# Patient Record
Sex: Female | Born: 1950 | Race: Black or African American | Hispanic: No | Marital: Married | Smoking: Former smoker
Health system: Southern US, Community
[De-identification: ages and names within clinical notes are randomized; demographics above are authoritative.]

## PROBLEM LIST (undated history)

## (undated) DIAGNOSIS — E079 Disorder of thyroid, unspecified: Secondary | ICD-10-CM

## (undated) DIAGNOSIS — M1990 Unspecified osteoarthritis, unspecified site: Secondary | ICD-10-CM

## (undated) DIAGNOSIS — J302 Other seasonal allergic rhinitis: Secondary | ICD-10-CM

## (undated) DIAGNOSIS — R011 Cardiac murmur, unspecified: Secondary | ICD-10-CM

## (undated) DIAGNOSIS — T7840XA Allergy, unspecified, initial encounter: Secondary | ICD-10-CM

## (undated) DIAGNOSIS — D649 Anemia, unspecified: Secondary | ICD-10-CM

## (undated) DIAGNOSIS — T4145XA Adverse effect of unspecified anesthetic, initial encounter: Secondary | ICD-10-CM

## (undated) DIAGNOSIS — K219 Gastro-esophageal reflux disease without esophagitis: Secondary | ICD-10-CM

## (undated) DIAGNOSIS — C801 Malignant (primary) neoplasm, unspecified: Secondary | ICD-10-CM

## (undated) DIAGNOSIS — K8020 Calculus of gallbladder without cholecystitis without obstruction: Secondary | ICD-10-CM

## (undated) DIAGNOSIS — J45909 Unspecified asthma, uncomplicated: Secondary | ICD-10-CM

## (undated) DIAGNOSIS — Z5189 Encounter for other specified aftercare: Secondary | ICD-10-CM

## (undated) DIAGNOSIS — T8859XA Other complications of anesthesia, initial encounter: Secondary | ICD-10-CM

## (undated) DIAGNOSIS — J189 Pneumonia, unspecified organism: Secondary | ICD-10-CM

## (undated) DIAGNOSIS — IMO0001 Reserved for inherently not codable concepts without codable children: Secondary | ICD-10-CM

## (undated) HISTORY — DX: Gastro-esophageal reflux disease without esophagitis: K21.9

## (undated) HISTORY — PX: BREAST SURGERY: SHX581

## (undated) HISTORY — PX: LUNG LOBECTOMY: SHX167

## (undated) HISTORY — PX: COLONOSCOPY: SHX174

## (undated) HISTORY — PX: POLYPECTOMY: SHX149

## (undated) HISTORY — DX: Cardiac murmur, unspecified: R01.1

## (undated) HISTORY — DX: Unspecified asthma, uncomplicated: J45.909

## (undated) HISTORY — DX: Anemia, unspecified: D64.9

## (undated) HISTORY — DX: Unspecified osteoarthritis, unspecified site: M19.90

## (undated) HISTORY — DX: Disorder of thyroid, unspecified: E07.9

## (undated) HISTORY — PX: UTERINE FIBROID SURGERY: SHX826

## (undated) HISTORY — DX: Allergy, unspecified, initial encounter: T78.40XA

## (undated) HISTORY — DX: Calculus of gallbladder without cholecystitis without obstruction: K80.20

## (undated) HISTORY — DX: Other seasonal allergic rhinitis: J30.2

## (undated) HISTORY — DX: Reserved for inherently not codable concepts without codable children: IMO0001

## (undated) HISTORY — DX: Encounter for other specified aftercare: Z51.89

---

## 1998-11-25 ENCOUNTER — Encounter: Payer: Self-pay | Admitting: Family Medicine

## 1998-11-25 ENCOUNTER — Ambulatory Visit (HOSPITAL_COMMUNITY): Admission: RE | Admit: 1998-11-25 | Discharge: 1998-11-25 | Payer: Self-pay | Admitting: Family Medicine

## 1998-12-16 ENCOUNTER — Ambulatory Visit (HOSPITAL_COMMUNITY): Admission: RE | Admit: 1998-12-16 | Discharge: 1998-12-16 | Payer: Self-pay | Admitting: Family Medicine

## 1998-12-16 ENCOUNTER — Encounter: Payer: Self-pay | Admitting: Family Medicine

## 1999-03-05 ENCOUNTER — Encounter: Payer: Self-pay | Admitting: Family Medicine

## 1999-03-05 ENCOUNTER — Ambulatory Visit (HOSPITAL_COMMUNITY): Admission: RE | Admit: 1999-03-05 | Discharge: 1999-03-05 | Payer: Self-pay | Admitting: Family Medicine

## 2000-01-28 ENCOUNTER — Ambulatory Visit (HOSPITAL_COMMUNITY): Admission: RE | Admit: 2000-01-28 | Discharge: 2000-01-28 | Payer: Self-pay | Admitting: Family Medicine

## 2000-01-28 ENCOUNTER — Encounter: Payer: Self-pay | Admitting: Family Medicine

## 2000-02-04 ENCOUNTER — Other Ambulatory Visit: Admission: RE | Admit: 2000-02-04 | Discharge: 2000-02-04 | Payer: Self-pay | Admitting: Family Medicine

## 2002-09-13 HISTORY — PX: ABDOMINAL HYSTERECTOMY: SHX81

## 2004-06-27 ENCOUNTER — Emergency Department (HOSPITAL_COMMUNITY): Admission: EM | Admit: 2004-06-27 | Discharge: 2004-06-27 | Payer: Self-pay | Admitting: Internal Medicine

## 2006-03-31 ENCOUNTER — Ambulatory Visit: Payer: Self-pay | Admitting: Obstetrics & Gynecology

## 2006-03-31 ENCOUNTER — Encounter: Payer: Self-pay | Admitting: Obstetrics & Gynecology

## 2006-04-13 ENCOUNTER — Ambulatory Visit (HOSPITAL_COMMUNITY): Admission: RE | Admit: 2006-04-13 | Discharge: 2006-04-13 | Payer: Self-pay | Admitting: Obstetrics & Gynecology

## 2006-05-02 ENCOUNTER — Ambulatory Visit: Payer: Self-pay | Admitting: Gynecology

## 2006-05-09 ENCOUNTER — Ambulatory Visit (HOSPITAL_COMMUNITY): Admission: RE | Admit: 2006-05-09 | Discharge: 2006-05-09 | Payer: Self-pay | Admitting: Obstetrics & Gynecology

## 2006-10-06 ENCOUNTER — Encounter: Admission: RE | Admit: 2006-10-06 | Discharge: 2006-10-06 | Payer: Self-pay | Admitting: Internal Medicine

## 2007-07-16 ENCOUNTER — Emergency Department (HOSPITAL_COMMUNITY): Admission: EM | Admit: 2007-07-16 | Discharge: 2007-07-16 | Payer: Self-pay | Admitting: Emergency Medicine

## 2008-10-21 ENCOUNTER — Emergency Department (HOSPITAL_COMMUNITY): Admission: EM | Admit: 2008-10-21 | Discharge: 2008-10-21 | Payer: Self-pay | Admitting: Emergency Medicine

## 2009-03-27 ENCOUNTER — Emergency Department (HOSPITAL_COMMUNITY): Admission: EM | Admit: 2009-03-27 | Discharge: 2009-03-27 | Payer: Self-pay | Admitting: Emergency Medicine

## 2010-12-21 LAB — BASIC METABOLIC PANEL
BUN: 14 mg/dL (ref 6–23)
CO2: 26 mEq/L (ref 19–32)
Calcium: 9.5 mg/dL (ref 8.4–10.5)
Chloride: 105 mEq/L (ref 96–112)
Creatinine, Ser: 0.78 mg/dL (ref 0.4–1.2)
GFR calc Af Amer: >60 mL/min (ref >60)
GFR calc non Af Amer: >60 mL/min (ref >60)
Glucose, Bld: 109 mg/dL — ABNORMAL HIGH (ref 70–99)
Potassium: 3.6 mEq/L (ref 3.5–5.1)
Sodium: 139 mEq/L (ref 135–145)

## 2010-12-21 LAB — DIFFERENTIAL
Basophils Absolute: 0 10*3/uL (ref 0.0–0.1)
Basophils Relative: 1 % (ref 0–1)
Eosinophils Absolute: 0 10*3/uL (ref 0.0–0.7)
Eosinophils Relative: 1 % (ref 0–5)
Lymphocytes Relative: 33 % (ref 12–46)
Lymphs Abs: 2.6 10*3/uL (ref 0.7–4.0)
Monocytes Absolute: 0.4 10*3/uL (ref 0.1–1.0)
Monocytes Relative: 5 % (ref 3–12)
Neutro Abs: 4.6 10*3/uL (ref 1.7–7.7)
Neutrophils Relative %: 60 % (ref 43–77)

## 2010-12-21 LAB — CBC
HCT: 41.7 % (ref 36.0–46.0)
Hemoglobin: 14 g/dL (ref 12.0–15.0)
MCHC: 33.5 g/dL (ref 30.0–36.0)
MCV: 89.9 fL (ref 78.0–100.0)
Platelets: 231 10*3/uL (ref 150–400)
RBC: 4.63 MIL/uL (ref 3.87–5.11)
RDW: 12.9 % (ref 11.5–15.5)
WBC: 7.7 10*3/uL (ref 4.0–10.5)

## 2010-12-21 LAB — HEMOCCULT GUIAC POC 1CARD (OFFICE): Fecal Occult Bld: NEGATIVE

## 2011-01-29 NOTE — Group Therapy Note (Signed)
Sarah Lee, Sarah Lee                 ACCOUNT NO.:  [id number]   MEDICAL RECORD NO.:  [id number]          PATIENT TYPE:  POB   LOCATION:  [facility]                   FACILITY:  [facility]   PHYSICIAN:  Elsie Lincoln, MD      DATE OF BIRTH:  1951-04-11   DATE OF SERVICE:  03/31/2006                                    CLINIC NOTE   This patient was seen at [facility].  The patient is a 60 year old female  para 2, status post hysterectomy in approximately 1996 or 1997.  The patient  presents for annual exam and also questionable left lower quadrant pain a  month ago.  She has extensive GYN history of endometriosis.  She had her  right ovary removed at age 18 for endometriosis and then a subsequent left  cystectomy for endometriosis and then a hysterectomy and left oophorectomy  in 1996 or 1997 by Dr. Myrlene Broker in [place].  She had her cervix left  behind due to heavy bleeding and required 4 units of blood transfusion.  She  said there was a lot of vascularity in the cervix and they were unable to  remove the cervix.  Since then she has been asymptomatic, however one month  ago she had some sharp pain in her left side as well as a bulge.  The pain  went directly from her front upper body to her back upper body.  She says  she has been able to do housework for longer than an hour without being  doubled over in pain.  I am still unclear whether this pain is in her pelvis  or if this is more musculoskeletal.  She is obese and does have some  arthritic pain in her knees.  She has been without insurance and unable to  get any of her health concerns addressed until recently.  The most of her  concerns today is that she wants a Pap smear, a mammogram and to see if this  bulge that she feels in her pelvis has something to do with her cervix.  She  is not experiencing any menopausal bleeding.  She is not sexually active  because her husband has erectile dysfunction secondary to bad diabetes.   PAST  MEDICAL HISTORY:  Swelling in one leg, dizzy spells and mild asthma.   PAST SURGICAL HISTORY:  All GYN surgery as above plus C-section x2, D&C x4  and questionable abnormal Pap smear that was resolved with D&C which does  not make sense to me.   ALLERGIES:  PENICILLIN AND QUESTIONABLY IBUPROFEN.   MEDICATIONS:  None.   FAMILY HISTORY:  Aunt has diabetes.  Grandfather had a heart attack.  Mother  has low blood pressure.  Aunt has breast cancer.  Aunt also has sarcoidosis.   REVIEW OF SYSTEMS:  Positive for bruising, swelling in her legs, muscle  aches, night sweats, fatigue, weight gain, dizzy spells, problems with  vision, nausea and hot flashes as well as pelvic pain as described above.   PHYSICAL EXAMINATION:  VITAL SIGNS:  Pulse 65, blood pressure 104/65, weight  236, height 5 feet  4 inches.  GENERAL:  Well-nourished, well-developed, no apparent distress.  THROAT:  Thyroid with no masses.  HEENT:  Normocephalic, atraumatic.  BREASTS:  No masses.  No lymphadenopathy.  No skin changes.  No thickening.  LUNGS:  Clear to auscultation bilaterally.  HEART:  Regular rate and rhythm.  ABDOMEN:  Soft, nontender, nondistended, obese.  No hernia.  No  lymphadenopathy.  GENITALIA:  Tanner 5.  Vagina slightly atrophic.  Urethra nontender.  Bladder well supported and nontender.  Cervix mobile.  Not really tender.  On bimanual there is no masses, however there is extreme pain on the left, I  believe the ischial spine.  This could be due to osteoarthritis.  I do not  believe there is any female organ that could be causing this.  This could be  due to some inflammatory process or arthritis as just described.  Her cervix  is very mobile and I do not believe this is causing her pain.  The right  adnexa is nontender with no masses.  EXTREMITIES:  On the lower extremities the left ankle is more swollen than  the right with evidence of venous stasis.   PLAN:  A 60 year old female:  1.  Pap  smear.  2.  Mammogram.  3.  Guaiac stool cards.  4.  Transvaginal ultrasound to look at pelvis.  5.  Plain film of pelvis to rule out osteoarthritis.  6.  Return to clinic to discuss pelvic pain with Dr. Mia Creek to see if he      can try colectomy and __________ laparoscopy if warranted.  I believe      her pain is more musculoskeletal in the left ischial spine and pelvis.      We will defer to Dr. Mart Piggs exam.           ______________________________  Elsie Lincoln, MD     KL/MEDQ  D:  03/31/2006  T:  03/31/2006  Job:  [id number]

## 2011-05-20 ENCOUNTER — Encounter: Payer: Self-pay | Admitting: Obstetrics and Gynecology

## 2011-05-20 ENCOUNTER — Ambulatory Visit (INDEPENDENT_AMBULATORY_CARE_PROVIDER_SITE_OTHER): Payer: Self-pay | Admitting: Obstetrics and Gynecology

## 2011-05-20 DIAGNOSIS — Z01419 Encounter for gynecological examination (general) (routine) without abnormal findings: Secondary | ICD-10-CM

## 2011-05-20 DIAGNOSIS — Z0142 Encounter for cervical smear to confirm findings of recent normal smear following initial abnormal smear: Secondary | ICD-10-CM

## 2011-05-20 DIAGNOSIS — K5900 Constipation, unspecified: Secondary | ICD-10-CM | POA: Insufficient documentation

## 2011-05-20 DIAGNOSIS — E669 Obesity, unspecified: Secondary | ICD-10-CM

## 2011-05-20 NOTE — Patient Instructions (Addendum)
It was great to see you today! You don't have anything that I am concerned about either on my exam of your cervix or your belly. I would recommend you work hard on loosing weight and exercising. I think that your belly pain is primarily from your constipation.  I want you to get some Colace (or it's generic form) and take 2 100mg  tablets every night for a week.  If that does not help regulate your bowels, go to 2 100mg  tablets two times per day.  If, after a week of this, you still are not having a soft bowel movement every day, I want you to start taking Miralax.  Start out with 1/2 capful today and go up by 1/2 capful every week until you are having one soft bowel movement every day. Come back to see [place] as needed.

## 2011-05-20 NOTE — Progress Notes (Signed)
Subjective: Has area on the left side of abdomon that is aching and throbbing intermittently.  Is located in the upper left abdomon just under the ribs.  No aggrevating/alleviating factors.  Ocurrs several times per month.  When she has this pain she often also has back pain.  Has had hysterectomy that spared cervix.  [facility]  Has had mammograms in the past but not in the past year and a half.  Objective:  Filed Vitals:   05/20/11 1436  BP: 121/78  Pulse: 71  Temp: 97.1 F (36.2 C)   Gen: NAD Abd: Obese, very mild tenderness in LUQ but no dominant masses or organomegaly.  No palpable stool in colon. Pelvic: Normal female external genitalia, normal vaginal mucosa.  No significant vaginal discharge.  No cervical motion tenderness.  Uterus is surgically absent.  No evidence of masses.

## 2012-02-14 LAB — HM PAP SMEAR

## 2012-04-25 ENCOUNTER — Other Ambulatory Visit: Payer: Self-pay | Admitting: Family Medicine

## 2012-04-25 DIAGNOSIS — K8020 Calculus of gallbladder without cholecystitis without obstruction: Secondary | ICD-10-CM

## 2012-04-25 DIAGNOSIS — R109 Unspecified abdominal pain: Secondary | ICD-10-CM

## 2012-05-01 ENCOUNTER — Ambulatory Visit
Admission: RE | Admit: 2012-05-01 | Discharge: 2012-05-01 | Disposition: A | Discharge status: Home / Self Care | Payer: BC Managed Care – PPO | Source: Ambulatory Visit | Attending: Family Medicine | Admitting: Family Medicine

## 2012-05-01 DIAGNOSIS — K8020 Calculus of gallbladder without cholecystitis without obstruction: Secondary | ICD-10-CM

## 2012-05-01 DIAGNOSIS — R109 Unspecified abdominal pain: Secondary | ICD-10-CM

## 2012-05-31 ENCOUNTER — Ambulatory Visit (AMBULATORY_SURGERY_CENTER): Payer: BC Managed Care – PPO | Admitting: *Deleted

## 2012-05-31 ENCOUNTER — Other Ambulatory Visit: Payer: Self-pay | Admitting: Family Medicine

## 2012-05-31 ENCOUNTER — Telehealth: Payer: Self-pay | Admitting: *Deleted

## 2012-05-31 ENCOUNTER — Encounter: Payer: Self-pay | Admitting: Gastroenterology

## 2012-05-31 VITALS — Ht 64.0 in | Wt 228.6 lb

## 2012-05-31 DIAGNOSIS — M79604 Pain in right leg: Secondary | ICD-10-CM

## 2012-05-31 DIAGNOSIS — Z1211 Encounter for screening for malignant neoplasm of colon: Secondary | ICD-10-CM

## 2012-05-31 MED ORDER — NA SULFATE-K SULFATE-MG SULF 17.5-3.13-1.6 GM/177ML PO SOLN: 1.0000 | Freq: Once | ORAL | Status: DC

## 2012-05-31 NOTE — Telephone Encounter (Signed)
I don't see this as a problem since the event took place 4 years ago

## 2012-05-31 NOTE — Telephone Encounter (Signed)
Dr. Arlyce Dice and Jonny Ruiz,  This pt had her PV today.  Her colonoscopy is scheduled for 06-12-12.  Just wanted to make sure of something.  Pt states she fell and hit her head in 2009.  She still has occasional ringing in her right ear, headaches at times, and occasional dizziness.  Her family MD is setting her up to see a neurologist for this. I just wanted to make sure you didn't want her to have her procedure after her neurology visit.   Thanks, [employer]

## 2012-05-31 NOTE — Telephone Encounter (Signed)
Noted  

## 2012-05-31 NOTE — Progress Notes (Signed)
Pt has no allergy to eggs or soy products  Pt states she fell and hit her head in 2009.  She still has occasional ringing in her right ear, headaches at times, and occasional dizziness.  Her family MD is setting her up to see a neurologist for this.  Note sent to Dr. Arlyce Dice and Azucena Kuba CRNA to make sure they do not want her to have procedure after her visit to the neurologist

## 2012-06-01 ENCOUNTER — Other Ambulatory Visit: Payer: BC Managed Care – PPO

## 2012-06-08 ENCOUNTER — Ambulatory Visit (INDEPENDENT_AMBULATORY_CARE_PROVIDER_SITE_OTHER): Payer: BC Managed Care – PPO | Admitting: General Surgery

## 2012-06-09 ENCOUNTER — Telehealth: Payer: Self-pay | Admitting: Gastroenterology

## 2012-06-09 ENCOUNTER — Encounter: Payer: BC Managed Care – PPO | Admitting: Gastroenterology

## 2012-06-09 NOTE — Telephone Encounter (Signed)
Called patient and advised her to come by [facility] 3rd floor to pick up sample of Suprep.  She agreed to do so today before 4:30 pm as it will be left at front desk by Ezra Sites, RN.

## 2012-06-12 ENCOUNTER — Encounter: Payer: Self-pay | Admitting: Gastroenterology

## 2012-06-12 ENCOUNTER — Ambulatory Visit (AMBULATORY_SURGERY_CENTER): Payer: BC Managed Care – PPO | Admitting: Gastroenterology

## 2012-06-12 VITALS — BP 130/66 | HR 65 | Temp 97.2°F | Resp 12 | Ht 64.5 in | Wt 229.0 lb

## 2012-06-12 DIAGNOSIS — K5730 Diverticulosis of large intestine without perforation or abscess without bleeding: Secondary | ICD-10-CM

## 2012-06-12 DIAGNOSIS — D126 Benign neoplasm of colon, unspecified: Secondary | ICD-10-CM

## 2012-06-12 DIAGNOSIS — Z800 Family history of malignant neoplasm of digestive organs: Secondary | ICD-10-CM

## 2012-06-12 DIAGNOSIS — Z1211 Encounter for screening for malignant neoplasm of colon: Secondary | ICD-10-CM

## 2012-06-12 MED ORDER — SODIUM CHLORIDE 0.9 % IV SOLN: 500.0000 mL | INTRAVENOUS | Status: DC

## 2012-06-12 NOTE — Patient Instructions (Addendum)
YOU HAD AN ENDOSCOPIC PROCEDURE TODAY AT THE  ENDOSCOPY CENTER: Refer to the procedure report that was given to you for any specific questions about what was found during the examination.  If the procedure report does not answer your questions, please call your gastroenterologist to clarify.  If you requested that your care partner not be given the details of your procedure findings, then the procedure report has been included in a sealed envelope for you to review at your convenience later.  YOU SHOULD EXPECT: Some feelings of bloating in the abdomen. Passage of more gas than usual.  Walking can help get rid of the air that was put into your GI tract during the procedure and reduce the bloating. If you had a lower endoscopy (such as a colonoscopy or flexible sigmoidoscopy) you may notice spotting of blood in your stool or on the toilet paper. If you underwent a bowel prep for your procedure, then you may not have a normal bowel movement for a few days.  DIET: Your first meal following the procedure should be a light meal and then it is ok to progress to your normal diet.  A half-sandwich or bowl of soup is an example of a good first meal.  Heavy or fried foods are harder to digest and may make you feel nauseous or bloated.  Likewise meals heavy in dairy and vegetables can cause extra gas to form and this can also increase the bloating.  Drink plenty of fluids but you should avoid alcoholic beverages for 24 hours.  ACTIVITY: Your care partner should take you home directly after the procedure.  You should plan to take it easy, moving slowly for the rest of the day.  You can resume normal activity the day after the procedure however you should NOT DRIVE or use heavy machinery for 24 hours (because of the sedation medicines used during the test).    SYMPTOMS TO REPORT IMMEDIATELY: A gastroenterologist can be reached at any hour.  During normal business hours, 8:30 AM to 5:00 PM Monday through Friday,  call [phone].  After hours and on weekends, please call the GI answering service at [phone] who will take a message and have the physician on call contact you.   Following lower endoscopy (colonoscopy or flexible sigmoidoscopy):  Excessive amounts of blood in the stool  Significant tenderness or worsening of abdominal pains  Swelling of the abdomen that is new, acute  Fever of 100F or higher  FOLLOW UP: If any biopsies were taken you will be contacted by phone or by letter within the next 1-3 weeks.  Call your gastroenterologist if you have not heard about the biopsies in 3 weeks.  Our staff will call the home number listed on your records the next business day following your procedure to check on you and address any questions or concerns that you may have at that time regarding the information given to you following your procedure. This is a courtesy call and so if there is no answer at the home number and we have not heard from you through the emergency physician on call, we will assume that you have returned to your regular daily activities without incident.  SIGNATURES/CONFIDENTIALITY: You and/or your care partner have signed paperwork which will be entered into your electronic medical record.  These signatures attest to the fact that that the information above on your After Visit Summary has been reviewed and is understood.  Full responsibility of the confidentiality of this   discharge information lies with you and/or your care-partner.   Thank-you for choosing us for your healthcare needs. 

## 2012-06-12 NOTE — Op Note (Signed)
[facility] [address] [place], [address]   COLONOSCOPY PROCEDURE REPORT  PATIENT: Sarah Lee, Sarah Lee  MR#: [id number] BIRTHDATE: 07-01-1951 , 61  yrs. old GENDER: Female ENDOSCOPIST: Louis Meckel, MD REFERRED XB:JYNWG Blount, M.D. PROCEDURE DATE:  06/12/2012 PROCEDURE:   Colonoscopy with cold biopsy polypectomy ASA CLASS:   Class II INDICATIONS:elevated risk screening, patient's immediate family history of colon cancer, and father with colon cancer in her early 61s. MEDICATIONS: MAC sedation, administered by CRNA and Propofol (Diprivan) 380 mg IV  DESCRIPTION OF PROCEDURE:   After the risks benefits and alternatives of the procedure were thoroughly explained, informed consent was obtained.  A digital rectal exam revealed no abnormalities of the rectum.   The LB CF-[id number]  endoscope was introduced through the anus and advanced to the cecum, which was identified by both the appendix and ileocecal valve. No adverse events experienced.   The quality of the prep was Suprep excellent The instrument was then slowly withdrawn as the colon was fully examined.      COLON FINDINGS: A sessile polyp measuring 2 mm in size was found at the cecum.  A polypectomy was performed with cold forceps.  The resection was complete and the polyp tissue was completely retrieved.   Mild diverticulosis was noted in the ascending colon. The colon mucosa was otherwise normal.  Retroflexed views revealed no abnormalities. The time to cecum=3 minutes 29 seconds. Withdrawal time=7 minutes 32 seconds.  The scope was withdrawn and the procedure completed. COMPLICATIONS: There were no complications.  ENDOSCOPIC IMPRESSION: 1.   Sessile polyp measuring 2 mm in size was found at the cecum; polypectomy was performed with cold forceps 2.   Mild diverticulosis was noted in the ascending colon 3.   The colon mucosa was otherwise normal  RECOMMENDATIONS: Given your significant  family history of colon cancer, you should have a repeat colonoscopy in 5 years   eSigned:  Louis Meckel, MD 06/12/2012 10:44 AM   cc:

## 2012-06-12 NOTE — Progress Notes (Signed)
Patient did not have preoperative order for IV antibiotic SSI prophylaxis. (G8918)   

## 2012-06-12 NOTE — Progress Notes (Signed)
Patient did not experience any of the following events: a burn prior to discharge; a fall within the facility; wrong site/side/patient/procedure/implant event; or a hospital transfer or hospital admission upon discharge from the facility. (G8907)  

## 2012-06-13 ENCOUNTER — Telehealth: Payer: Self-pay

## 2012-06-13 NOTE — Telephone Encounter (Signed)
Left message on answering machine. 

## 2012-06-14 ENCOUNTER — Encounter (INDEPENDENT_AMBULATORY_CARE_PROVIDER_SITE_OTHER): Payer: Self-pay | Admitting: General Surgery

## 2012-06-16 ENCOUNTER — Encounter: Payer: Self-pay | Admitting: Gastroenterology

## 2012-10-12 ENCOUNTER — Ambulatory Visit (INDEPENDENT_AMBULATORY_CARE_PROVIDER_SITE_OTHER): Payer: BC Managed Care – PPO | Admitting: Gastroenterology

## 2012-10-12 ENCOUNTER — Telehealth: Payer: Self-pay | Admitting: Gastroenterology

## 2012-10-12 ENCOUNTER — Encounter: Payer: Self-pay | Admitting: Gastroenterology

## 2012-10-12 VITALS — BP 132/64 | HR 82 | Ht 64.5 in | Wt 228.0 lb

## 2012-10-12 DIAGNOSIS — K649 Unspecified hemorrhoids: Secondary | ICD-10-CM

## 2012-10-12 DIAGNOSIS — K625 Hemorrhage of anus and rectum: Secondary | ICD-10-CM

## 2012-10-12 MED ORDER — HYDROCORTISONE ACETATE 25 MG RE SUPP: 25.0000 mg | Freq: Every evening | RECTAL | Status: DC | PRN

## 2012-10-12 NOTE — Patient Instructions (Addendum)
We have sent the following medications to your pharmacy for you to pick up at your convenience: Anusol suppositories; please take as directed.  Follow up as needed

## 2012-10-12 NOTE — Telephone Encounter (Signed)
Pt states she has hemorrhoids and they were bothering her on Monday and now they are shrinking. States that when she gets up to walk however there is bright red blood coming from her rectum. Pt is wearing a pad to catch the blood. Pt scheduled to see Doug Sou PA today at 3pm. Pt aware of appt date and time.

## 2012-10-12 NOTE — Progress Notes (Signed)
10/12/2012 Sarah Lee [id number] Jan 13, 1951   HISTORY OF PRESENT ILLNESS:  Patient is a pleasant 62 year old female who is a patient of Dr. Marzetta Board.  She presents to our office today with complaints of hemorrhoidal bleeding.  Had some irritation last week, but that resolved on its own.  Then Monday she began seeing some blood on the toilet paper and in her underwear after she is up and moving around for a while.  Did not use anything for them.  She has seen blood in the past.  She had a colonoscopy in 05/2012, which showed only mild diverticulosis and one small polyp that was actually benign mucosa on pathology report.  Denies any rectal or abdominal pain.  Has chronic constipation, but says that she has been controlling that fairly well with diet recently.  She says that she has used Miralax and other regimens in the past without much success.     Past Medical History  Diagnosis Date  . Anemia   . Blood transfusion   . Gallstones   . Constipation   . Allergy   . Asthma     no medications  . Cataract     slight  . GERD (gastroesophageal reflux disease)   . Heart murmur    Past Surgical History  Procedure Date  . Abdominal hysterectomy   . Uterine fibroid surgery   . Cesarean section     reports that she quit smoking about 24 years ago. She has never used smokeless tobacco. She reports that she does not drink alcohol or use illicit drugs. family history includes Cancer in her father and Stomach cancer in her cousin and maternal uncle.  There is no history of Colon cancer, and Esophageal cancer, and Rectal cancer, . Allergies  Allergen Reactions  . Iron Other (See Comments)    Extreme constipation  . Penicillins Rash  . Tetracyclines & Related Nausea And Vomiting and Anxiety      No outpatient encounter prescriptions on file as of 10/12/2012.     REVIEW OF SYSTEMS  : All other systems reviewed and negative except where noted in the History of Present Illness.   PHYSICAL  EXAM: BP 132/64  Pulse 82  Ht 5' 4.5" (1.638 m)  Wt 228 lb (103.42 kg)  BMI 38.53 kg/m2 General: Well developed black female in no acute distress Head: Normocephalic and atraumatic Eyes:  sclerae anicteric,conjunctive pink. Ears: Normal auditory acuity Lungs: Clear throughout to auscultation Heart: Regular rate and rhythm Abdomen: Soft, nontender, non-distended. No masses or hepatomegaly noted. Normal bowel sounds. Rectal: Enlarged hemorrhoid noted with some blood.  Small amount of pink blood on exam glove. Musculoskeletal: Symmetrical with no gross deformities  Skin: No lesions on visible extremities Extremities: No edema  Neurological: Alert oriented x 4, grossly nonfocal Psychological:  Alert and cooperative. Normal mood and affect  ASSESSMENT AND PLAN: -Hemorrhoid with rectal bleeding:  Will give anusol suppositories to use at bedtime for the next two weeks.  She will call if there is an ongoing issue. -Constipation:  Says that she has bene managing this fairly well with diet recently and would like to continue with that for now.

## 2012-10-22 NOTE — Progress Notes (Signed)
Reviewed and agree with management. Robert D. Kaplan, M.D., FACG  

## 2013-06-27 ENCOUNTER — Other Ambulatory Visit (HOSPITAL_COMMUNITY): Payer: Self-pay | Admitting: Internal Medicine

## 2013-06-27 ENCOUNTER — Ambulatory Visit (HOSPITAL_COMMUNITY)
Admission: RE | Admit: 2013-06-27 | Discharge: 2013-06-27 | Disposition: A | Discharge status: Home / Self Care | Payer: BC Managed Care – PPO | Source: Ambulatory Visit | Attending: Internal Medicine | Admitting: Internal Medicine

## 2013-06-27 DIAGNOSIS — R0602 Shortness of breath: Secondary | ICD-10-CM

## 2013-07-18 ENCOUNTER — Ambulatory Visit: Payer: BC Managed Care – PPO | Admitting: Neurology

## 2013-08-15 ENCOUNTER — Ambulatory Visit (INDEPENDENT_AMBULATORY_CARE_PROVIDER_SITE_OTHER): Payer: BC Managed Care – PPO | Admitting: Neurology

## 2013-08-15 ENCOUNTER — Encounter: Payer: Self-pay | Admitting: Neurology

## 2013-08-15 VITALS — BP 100/68 | HR 78 | Temp 98.1°F | Ht 64.0 in | Wt 226.0 lb

## 2013-08-15 DIAGNOSIS — H9390 Unspecified disorder of ear, unspecified ear: Secondary | ICD-10-CM

## 2013-08-15 DIAGNOSIS — H8193 Unspecified disorder of vestibular function, bilateral: Secondary | ICD-10-CM

## 2013-08-15 DIAGNOSIS — R42 Dizziness and giddiness: Secondary | ICD-10-CM

## 2013-08-15 NOTE — Progress Notes (Addendum)
NEUROLOGY CONSULTATION NOTE  Sarah Lee MRN: [id number] DOB: [birth year]  Referring provider: Dr. Bruna Potter Primary care provider: Dr. Bruna Potter  Reason for consult:  Dizziness  HISTORY OF PRESENT ILLNESS: Sarah Lee is a 62 year old right-handed woman with history of hypercholesterolemia who presents for dizziness.  Records and images were personally reviewed where available.    In 2010, she slipped on the ice, falling backwards and hitting her head.  She didn't lose consciousness.  She said that there was clear fluid from her ears.  She developed a headache and did go to urgent care the next day and was diagnosed with posttraumatic headache.  She did not have any workup such as a head scan.  Since that time, she has suffered intermittent headaches.  Over the past 3 months, she has had progressive dizzy spells.  She notes sudden onset of sensation of movement in her head.  It is not spinning but more of a wave-like movement.  There is no sense of feeling like she is going to pass out.  It is not associated with dysarthria, dysphagia, headache, nausea, photophobia or phonophobia.  There is no associated numbness or weakness of the extremities.  It usually lasts 30 to 60 seconds.  It can occur spontaneously while walking, or when she is focusing her eyes and moving her head, such as looking around and reading labels at the store.  It does not typically occur with getting up or laying down.  She does have history of tinnitus since the accident, but no hearing loss.  She does note a clogging sensation in her left ear, sometimes with a pain in her outer ear canal (like a pimple).  PAST MEDICAL HISTORY: Past Medical History  Diagnosis Date  . Anemia   . Blood transfusion   . Gallstones   . Constipation   . Allergy   . Asthma     no medications  . Cataract     slight  . GERD (gastroesophageal reflux disease)   . Heart murmur     PAST SURGICAL HISTORY: Past Surgical History  Procedure  Laterality Date  . Abdominal hysterectomy    . Uterine fibroid surgery    . Cesarean section      MEDICATIONS: Current Outpatient Prescriptions on File Prior to Visit  Medication Sig Dispense Refill  . hydrocortisone (ANUSOL-HC) 25 MG suppository Place 1 suppository (25 mg total) rectally at bedtime as needed for hemorrhoids.  12 suppository  0   No current facility-administered medications on file prior to visit.    ALLERGIES: Allergies  Allergen Reactions  . Iron Other (See Comments)    Extreme constipation  . Penicillins Rash  . Tetracyclines & Related Nausea And Vomiting and Anxiety    FAMILY HISTORY: Family History  Problem Relation Age of Onset  . Cancer Father     prostate  . Colon cancer Neg Hx   . Esophageal cancer Neg Hx   . Stomach cancer Cousin     maternal side   . Rectal cancer Neg Hx   . Stomach cancer Maternal Uncle     SOCIAL HISTORY: History   Social History  . Marital Status: Married    Spouse Name: N/A    Number of Children: N/A  . Years of Education: N/A   Occupational History  . Not on file.   Social History Main Topics  . Smoking status: Former Smoker    Quit date: 03/02/1988  . Smokeless tobacco: Never Used  .  Alcohol Use: No  . Drug Use: No  . Sexual Activity: Not Currently   Other Topics Concern  . Not on file   Social History Narrative  . No narrative on file    REVIEW OF SYSTEMS: Constitutional: No fevers, chills, or sweats, no generalized fatigue, change in appetite Eyes: No visual changes, double vision, eye pain Ear, nose and throat: No hearing loss, ear pain, nasal congestion, sore throat Cardiovascular: Longstanding palpitations. Respiratory:  No shortness of breath at rest or with exertion, wheezes GastrointestinaI: No nausea, vomiting, diarrhea, abdominal pain, fecal incontinence Genitourinary:  No dysuria, urinary retention or frequency Musculoskeletal:  No neck pain, back pain Integumentary: No rash,  pruritus, skin lesions Neurological: as above Psychiatric: No depression, insomnia, anxiety Endocrine: No palpitations, fatigue, diaphoresis, mood swings, change in appetite, change in weight, increased thirst Hematologic/Lymphatic:  No anemia, purpura, petechiae. Allergic/Immunologic: no itchy/runny eyes, nasal congestion, recent allergic reactions, rashes  PHYSICAL EXAM: Filed Vitals:   08/15/13 0940  BP: 100/68  Pulse: 78  Temp: 98.1 F (36.7 C)   General: No acute distress Head:  Normocephalic/atraumatic.  Ear canals and TM normal bilaterally. Neck: supple, no paraspinal tenderness, full range of motion Back: No paraspinal tenderness Heart: regular rate and rhythm Lungs: Clear to auscultation bilaterally. Vascular: No carotid bruits. Neurological Exam: Mental status: alert and oriented to person, place, and time, speech fluent and not dysarthric, language intact. Cranial nerves: CN I: not tested CN II: pupils equal, round and reactive to light, visual fields intact, fundi unremarkable. CN III, IV, VI:  full range of motion, no nystagmus, no ptosis CN V: facial sensation intact CN VII: upper and lower face symmetric CN VIII: hearing intact CN IX, X: gag intact, uvula midline CN XI: sternocleidomastoid and trapezius muscles intact CN XII: tongue midline Bulk & Tone: normal, no fasciculations. Motor: 5/5 throughout Sensation: temperature and vibration intact Deep Tendon Reflexes: 2+ throughout, toes down Finger to nose testing: normal Heel to shin: normal Gait: normal stance and stride.  Able to walk in tandem. Romberg negative.  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  Follow up in 2 months.  45 minutes spent with patient, over 50% spent counseling and coordinating care.  Thank you for allowing me to take part in the care of this patient.  Shon Millet, DO  CC:  Clyda Greener, MD

## 2013-08-15 NOTE — Patient Instructions (Addendum)
Refer for vestibular rehab.  This will likely help with the dysequilibrium.  Follow up in 2 months.   We will refer you to the [facility] located at [address] in [place] for [employer]. They will call you to make the appointment.   [phone].

## 2013-10-16 ENCOUNTER — Ambulatory Visit: Payer: BC Managed Care – PPO | Admitting: Neurology

## 2013-12-06 ENCOUNTER — Ambulatory Visit (INDEPENDENT_AMBULATORY_CARE_PROVIDER_SITE_OTHER): Payer: BC Managed Care – PPO | Admitting: Neurology

## 2013-12-06 ENCOUNTER — Encounter: Payer: Self-pay | Admitting: Neurology

## 2013-12-06 VITALS — BP 138/78 | HR 80 | Temp 98.0°F | Resp 16 | Ht 64.5 in | Wt 228.0 lb

## 2013-12-06 DIAGNOSIS — G44209 Tension-type headache, unspecified, not intractable: Secondary | ICD-10-CM

## 2013-12-06 DIAGNOSIS — H8190 Unspecified disorder of vestibular function, unspecified ear: Secondary | ICD-10-CM

## 2013-12-06 DIAGNOSIS — H9390 Unspecified disorder of ear, unspecified ear: Secondary | ICD-10-CM

## 2013-12-06 DIAGNOSIS — R51 Headache: Secondary | ICD-10-CM

## 2013-12-06 LAB — SEDIMENTATION RATE: Sed Rate: 5 mm/hr (ref 0–22)

## 2013-12-06 MED ORDER — PREDNISONE 10 MG PO TABS: ORAL_TABLET | ORAL | Status: DC

## 2013-12-06 NOTE — Patient Instructions (Addendum)
I really don't suspect anything dangerous.  Your exam looks okay.  I will prescribe you a prednisone taper to help break the headache.  Take 6 pills for one day, then 5 pills for one day, then 4 pills for one day, then 3 pills for one day, then 2 pills for one day, then 1 pill for one day, then stop.  We will also check a blood test called a sed rate to look for any evidence of inflammation.  Follow up in 4 weeks.  I'm glad you don't need to take pain relievers.  If you do, don't take more than 2 days out of the week to prevent rebound headache. Appt with Dr Foye Deer June 4th at 10:15 am  [place]  Telephone [phone]
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
## 2013-12-12 LAB — HM MAMMOGRAPHY

## 2014-01-08 ENCOUNTER — Ambulatory Visit (INDEPENDENT_AMBULATORY_CARE_PROVIDER_SITE_OTHER): Payer: BC Managed Care – PPO | Admitting: Neurology

## 2014-01-08 ENCOUNTER — Encounter: Payer: Self-pay | Admitting: Neurology

## 2014-01-08 VITALS — BP 122/70 | HR 70 | Temp 98.0°F | Resp 16 | Ht 65.0 in | Wt 235.0 lb

## 2014-01-08 DIAGNOSIS — R29898 Other symptoms and signs involving the musculoskeletal system: Secondary | ICD-10-CM

## 2014-01-08 DIAGNOSIS — M6281 Muscle weakness (generalized): Secondary | ICD-10-CM

## 2014-01-08 DIAGNOSIS — R51 Headache: Secondary | ICD-10-CM
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
## 2014-01-08 NOTE — Patient Instructions (Signed)
Since you have been having new type of headache and had that episode of transient right hand weakness, we will get an MRI of the brain.  Follow up in 3 months or as needed.

## 2014-01-29 ENCOUNTER — Ambulatory Visit (HOSPITAL_COMMUNITY): Admission: RE | Admit: 2014-01-29 | Payer: BC Managed Care – PPO | Source: Ambulatory Visit

## 2014-02-13 ENCOUNTER — Telehealth: Payer: Self-pay

## 2014-02-13 NOTE — Telephone Encounter (Signed)
Left message for call back Identifiable  New patient  Pap- hx. Hysterectomy [facility]- 06/12/12- Dr. Deatra Ina- benign polyp & mild diverticulosis--repeat [facility] in 5 years (05/2017)--family hx of colon cancer [facility] Td Z

## 2014-02-13 NOTE — Telephone Encounter (Addendum)
New Patient Previous PCP:  Dr. Sheryle Hail, Dr. Kennon Holter, Dr. Criss Rosales  Hx. Intermittent sharp pain--head; dizziness, and right arm and hand weakness.  Last episode: April 2015.  Suppose to have a MRI done on Monday.    Medication and allergies:  Reviewed and updated  90 day supply/mail order: n/a Local pharmacy:  [facility], [address] [place]   Immunizations due: Zoster-declined   A/P: No changes to personal, family history or past surgical hx PAP- last PAP 2 years ago--typically done at the hospital---normal; hx. hysterectomy [facility]-06/12/12- Dr. Deatra Ina- benign polyp & mild diverticulosis--repeat [facility] in 5 years (05/2017)--family hx of colon cancer [facility]- 12/2013--[facility]---normal Tdap- less than 10 years ago Shingles- declined  To Discuss with Provider: Nothing at this time.

## 2014-02-14 ENCOUNTER — Encounter: Payer: Self-pay | Admitting: Family Medicine

## 2014-02-14 ENCOUNTER — Telehealth: Payer: Self-pay | Admitting: *Deleted

## 2014-02-14 ENCOUNTER — Ambulatory Visit (INDEPENDENT_AMBULATORY_CARE_PROVIDER_SITE_OTHER): Payer: BC Managed Care – PPO | Admitting: Family Medicine

## 2014-02-14 VITALS — BP 120/64 | HR 72 | Temp 98.2°F | Ht 64.5 in | Wt 237.6 lb

## 2014-02-14 DIAGNOSIS — Z0000 Encounter for general adult medical examination without abnormal findings: Secondary | ICD-10-CM

## 2014-02-14 DIAGNOSIS — N390 Urinary tract infection, site not specified: Secondary | ICD-10-CM | POA: Diagnosis not present

## 2014-02-14 LAB — CBC WITH DIFFERENTIAL/PLATELET
Basophils Absolute: 0 10*3/uL (ref 0.0–0.1)
Basophils Relative: 0.3 % (ref 0.0–3.0)
Eosinophils Absolute: 0 10*3/uL (ref 0.0–0.7)
Eosinophils Relative: 1 % (ref 0.0–5.0)
HCT: 39.7 % (ref 36.0–46.0)
Hemoglobin: 13.3 g/dL (ref 12.0–15.0)
Lymphocytes Relative: 32.4 % (ref 12.0–46.0)
Lymphs Abs: 1.6 10*3/uL (ref 0.7–4.0)
MCHC: 33.5 g/dL (ref 30.0–36.0)
MCV: 87.9 fl (ref 78.0–100.0)
Monocytes Absolute: 0.4 10*3/uL (ref 0.1–1.0)
Monocytes Relative: 8 % (ref 3.0–12.0)
Neutro Abs: 2.9 10*3/uL (ref 1.4–7.7)
Neutrophils Relative %: 58.3 % (ref 43.0–77.0)
Platelets: 215 10*3/uL (ref 150.0–400.0)
RBC: 4.52 Mil/uL (ref 3.87–5.11)
RDW: 13.4 % (ref 11.5–15.5)
WBC: 5 10*3/uL (ref 4.0–10.5)

## 2014-02-14 LAB — BASIC METABOLIC PANEL
BUN: 10 mg/dL (ref 6–23)
CO2: 26 mEq/L (ref 19–32)
Calcium: 9.5 mg/dL (ref 8.4–10.5)
Chloride: 108 mEq/L (ref 96–112)
Creatinine, Ser: 0.6 mg/dL (ref 0.4–1.2)
GFR: 127.02 mL/min (ref >60.00)
Glucose, Bld: 81 mg/dL (ref 70–99)
Potassium: 3.9 mEq/L (ref 3.5–5.1)
Sodium: 140 mEq/L (ref 135–145)

## 2014-02-14 LAB — TSH: TSH: 0.86 u[IU]/mL (ref 0.35–4.50)

## 2014-02-14 LAB — POCT URINALYSIS DIPSTICK
Bilirubin, UA: NEGATIVE
Glucose, UA: NEGATIVE
Ketones, UA: NEGATIVE
Nitrite, UA: NEGATIVE
Spec Grav, UA: 1.02
Urobilinogen, UA: 0.2
pH, UA: 6

## 2014-02-14 LAB — LIPID PANEL
Cholesterol: 200 mg/dL (ref 0–200)
HDL: 55 mg/dL (ref >39.00)
LDL Cholesterol: 117 mg/dL — ABNORMAL HIGH (ref 0–99)
NonHDL: 145
Total CHOL/HDL Ratio: 4
Triglycerides: 140 mg/dL (ref 0.0–149.0)
VLDL: 28 mg/dL (ref 0.0–40.0)

## 2014-02-14 LAB — HEPATIC FUNCTION PANEL
ALT: 28 U/L (ref 0–35)
AST: 26 U/L (ref 0–37)
Albumin: 3.8 g/dL (ref 3.5–5.2)
Alkaline Phosphatase: 63 U/L (ref 39–117)
Bilirubin, Direct: 0.1 mg/dL (ref 0.0–0.3)
Total Bilirubin: 0.6 mg/dL (ref 0.2–1.2)
Total Protein: 6.6 g/dL (ref 6.0–8.3)

## 2014-02-14 NOTE — Telephone Encounter (Signed)
[employer] called asking for dx code for  And what type of MRI was ordered on this patient Sarah Lee   DX code 784.0  With rt sided weakness

## 2014-02-14 NOTE — Patient Instructions (Signed)

## 2014-02-14 NOTE — Addendum Note (Signed)
Addended by: Peggyann Shoals on: 02/14/2014 11:56 AM   Modules accepted: Orders

## 2014-02-14 NOTE — Progress Notes (Signed)
Subjective:     Sarah Lee is a 63 y.o. female and is here for a comprehensive physical exam. The patient reports problems - congestion.  History   Social History  . Marital Status: Married    Spouse Name: N/A    Number of Children: N/A  . Years of Education: N/A   Occupational History  .      unemployed   Social History Main Topics  . Smoking status: Former Smoker -- 1.50 packs/day for 25 years    Types: Cigarettes    Quit date: 03/02/1988  . Smokeless tobacco: Never Used  . Alcohol Use: No  . Drug Use: No  . Sexual Activity: Not Currently   Other Topics Concern  . Not on file   Social History Narrative   Exercise--  no   Health Maintenance  Topic Date Due  . Tetanus/tdap  03/09/1969  . Zostavax  06/15/2014 (Originally 03/09/2010)  . Influenza Vaccine  04/13/2014  . Pap Smear  02/14/2015  . Mammogram  12/13/2015  . Colonoscopy  06/12/2017    The following portions of the patient's history were reviewed and updated as appropriate:  She  has a past medical history of Anemia; Blood transfusion; Gallstones; Constipation; Allergy; Asthma; Cataract; GERD (gastroesophageal reflux disease); Heart murmur; and Seasonal allergies. She  does not have any pertinent problems on file. She  has past surgical history that includes Uterine fibroid surgery; Cesarean section; and Abdominal hysterectomy (2004). Her family history includes Anemia in her mother; Cancer in her father; Stomach cancer in her cousin and maternal uncle. There is no history of Colon cancer, Esophageal cancer, or Rectal cancer. She  reports that she quit smoking about 25 years ago. Her smoking use included Cigarettes. She has a 37.5 pack-year smoking history. She has never used smokeless tobacco. She reports that she does not drink alcohol or use illicit drugs. She has a current medication list which includes the following prescription(s): hydrocortisone. Current Outpatient Prescriptions on File Prior to Visit   Medication Sig Dispense Refill  . hydrocortisone (ANUSOL-HC) 25 MG suppository Place 1 suppository (25 mg total) rectally at bedtime as needed for hemorrhoids.  12 suppository  0   No current facility-administered medications on file prior to visit.   She is allergic to iron; penicillins; and tetracyclines & related..  Review of Systems Review of Systems  Constitutional: Negative for activity change, appetite change and fatigue.  HENT: Negative for hearing loss, congestion, tinnitus and ear discharge.  dentist q39m Eyes: Negative for visual disturbance (see optho q1y -- vision corrected to 20/20 with glasses).  Respiratory: Negative for cough, chest tightness and shortness of breath.   Cardiovascular: Negative for chest pain, palpitations and leg swelling.  Gastrointestinal: Negative for abdominal pain, diarrhea, constipation and abdominal distention.  Genitourinary: Negative for urgency, frequency, decreased urine volume and difficulty urinating.  Musculoskeletal: Negative for back pain, arthralgias and gait problem.  Skin: Negative for color change, pallor and rash.  Neurological: Negative for dizziness, light-headedness, numbness and headaches.  Hematological: Negative for adenopathy. Does not bruise/bleed easily.  Psychiatric/Behavioral: Negative for suicidal ideas, confusion, sleep disturbance, self-injury, dysphoric mood, decreased concentration and agitation.       Objective:    BP 120/64  Pulse 72  Temp(Src) 98.2 F (36.8 C) (Oral)  Ht 5' 4.5" (1.638 m)  Wt 237 lb 9.6 oz (107.775 kg)  BMI 40.17 kg/m2  SpO2 96% General appearance: alert, cooperative, appears stated age and no distress Head: Normocephalic, without obvious abnormality,  atraumatic Eyes: conjunctivae/corneas clear. PERRL, EOM's intact. Fundi benign. Ears: normal TM's and external ear canals both ears Nose: Nares normal. Septum midline. Mucosa normal. No drainage or sinus tenderness. Throat: lips, mucosa,  and tongue normal; teeth and gums normal Neck: no adenopathy, no carotid bruit, no JVD, supple, symmetrical, trachea midline and thyroid not enlarged, symmetric, no tenderness/mass/nodules Back: symmetric, no curvature. ROM normal. No CVA tenderness. Lungs: clear to auscultation bilaterally Breasts: normal appearance, no masses or tenderness Heart: regular rate and rhythm, S1, S2 normal, no murmur, click, rub or gallop Abdomen: soft, non-tender; bowel sounds normal; no masses,  no organomegaly Pelvic: deferred Extremities: extremities normal, atraumatic, no cyanosis or edema Pulses: 2+ and symmetric Skin: Skin color, texture, turgor normal. No rashes or lesions Lymph nodes: Cervical, supraclavicular, and axillary nodes normal. Neurologic: Alert and oriented X 3, normal strength and tone. Normal symmetric reflexes. Normal coordination and gait Psych- no depression, no anxiety      Assessment:    Healthy female exam.     Plan:    ghm utd Check labsl See After Visit Summary for Counseling Recommendations  Pt will find her Dtap info at home and she will think about shingles vaccine

## 2014-02-14 NOTE — Progress Notes (Signed)
Pre visit review using our clinic review tool, if applicable. No additional management support is needed unless otherwise documented below in the visit note. 

## 2014-02-16 LAB — URINE CULTURE
Colony Count: NO GROWTH
Organism ID, Bacteria: NO GROWTH

## 2014-02-18 ENCOUNTER — Ambulatory Visit (HOSPITAL_COMMUNITY): Admission: RE | Admit: 2014-02-18 | Payer: BC Managed Care – PPO | Source: Ambulatory Visit

## 2014-02-22 ENCOUNTER — Ambulatory Visit
Admission: RE | Admit: 2014-02-22 | Discharge: 2014-02-22 | Disposition: A | Discharge status: Home / Self Care | Payer: BC Managed Care – PPO | Source: Ambulatory Visit | Attending: Neurology | Admitting: Neurology

## 2014-02-22 DIAGNOSIS — R51 Headache: Secondary | ICD-10-CM

## 2014-02-22 DIAGNOSIS — R29898 Other symptoms and signs involving the musculoskeletal system: Secondary | ICD-10-CM

## 2014-02-26 ENCOUNTER — Telehealth: Payer: Self-pay | Admitting: *Deleted

## 2014-02-26 NOTE — Telephone Encounter (Signed)
Message copied by Claudie Revering on Tue Feb 26, 2014 10:59 AM ------      Message from: JAFFE, ADAM R      Created: Tue Feb 26, 2014  9:55 AM       MRI of the brain does not reveal any visible cause for episode of right hand weakness.  There are some small spots seen on the brain which may be related to her history of head trauma, but nothing acute.      ----- Message -----         From: Rad Results In Interface         Sent: 02/22/2014   8:03 PM           To: Dudley Major, DO                   ------

## 2014-04-09 ENCOUNTER — Ambulatory Visit: Payer: BC Managed Care – PPO | Admitting: Neurology

## 2014-07-09 ENCOUNTER — Telehealth: Payer: Self-pay | Admitting: Neurology

## 2014-07-09 NOTE — Telephone Encounter (Signed)
Pt canceled her f/u for 07/12/14 due to her insurance not being active yet. Pt will call back to r/s.

## 2014-07-12 ENCOUNTER — Ambulatory Visit: Payer: BC Managed Care – PPO | Admitting: Neurology

## 2014-07-15 ENCOUNTER — Encounter: Payer: Self-pay | Admitting: Family Medicine

## 2014-07-30 IMAGING — US US ABDOMEN COMPLETE
1 series · 13 of 25 positions shown · non-contrast
Comparison: CT abdomen pelvis of 05/09/2006

CLINICAL DATA: Abdominal pain

COMPLETE ABDOMINAL ULTRASOUND

[Series 1: us abdomen complete · 0.26mm/px · 13 of 91 slices shown]
[im 1/91]
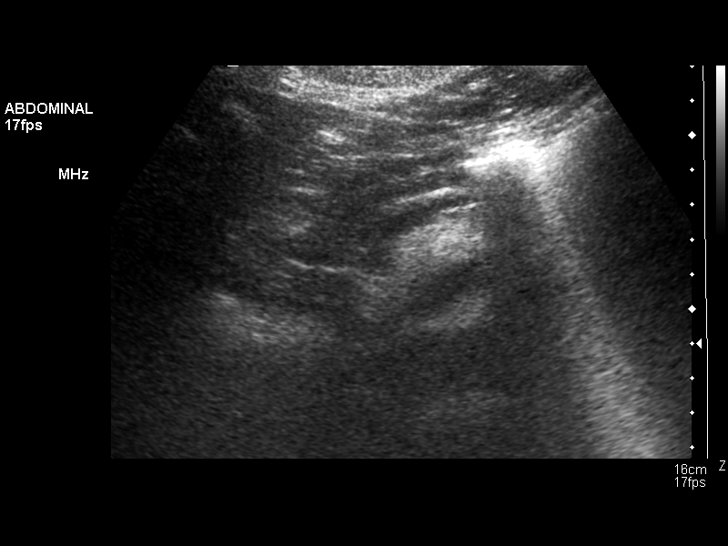
[im 8/91]
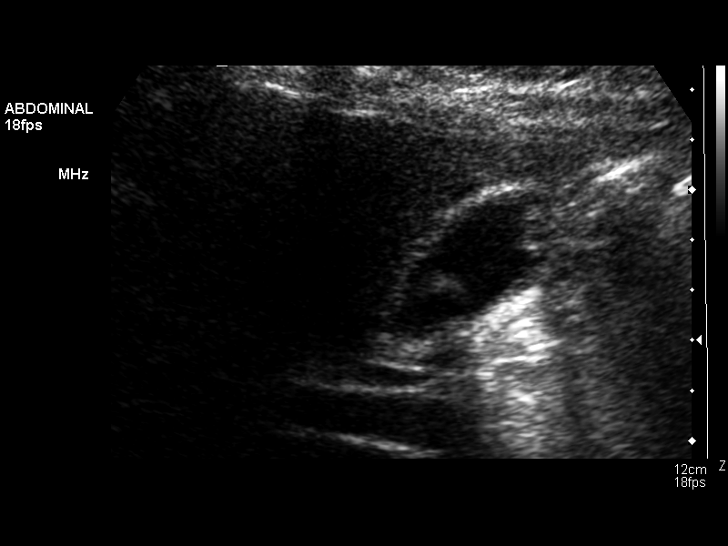
[im 16/91]
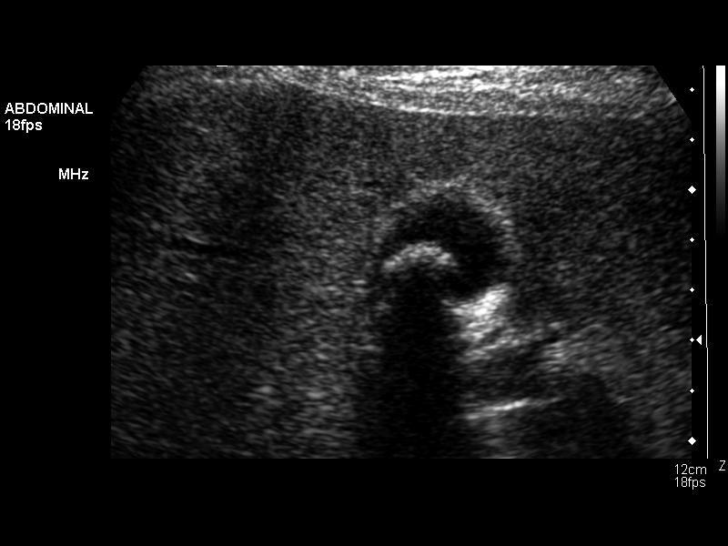
[im 23/91]
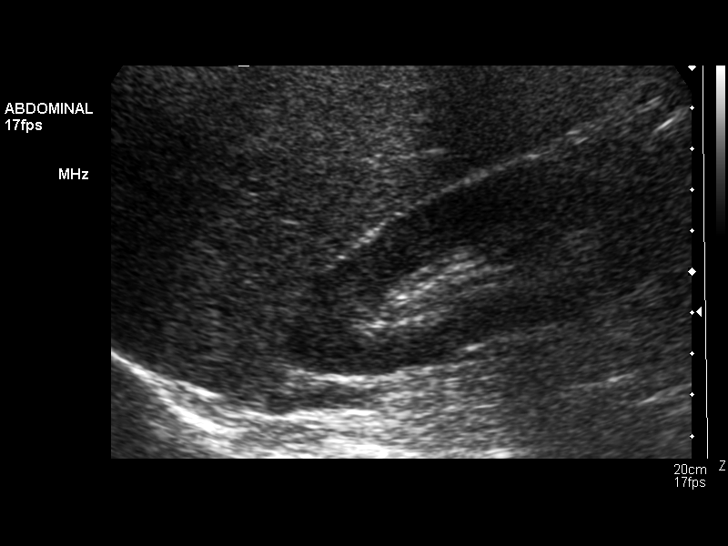
[im 31/91]
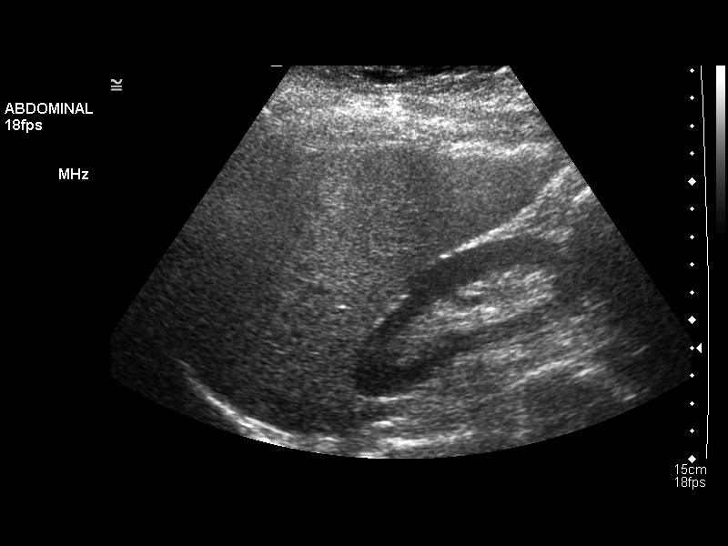
[im 38/91]
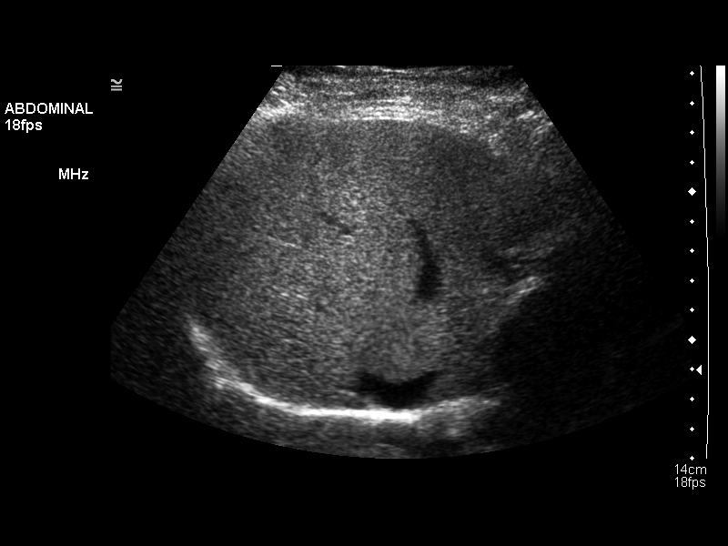
[im 46/91]
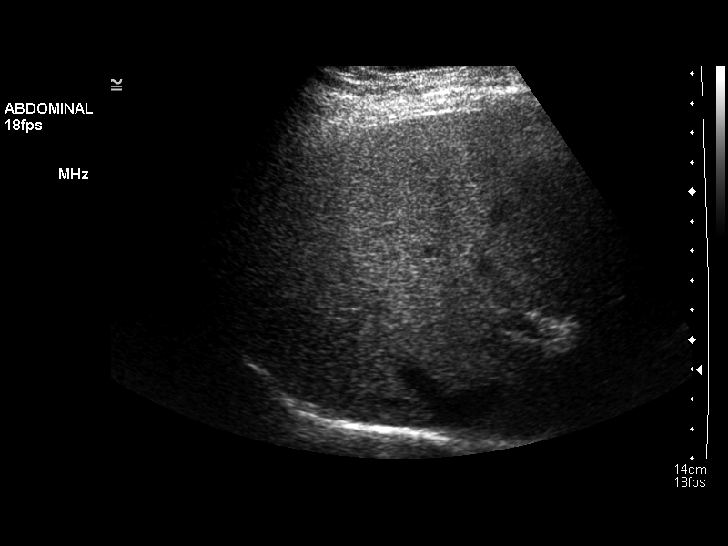
[im 53/91]
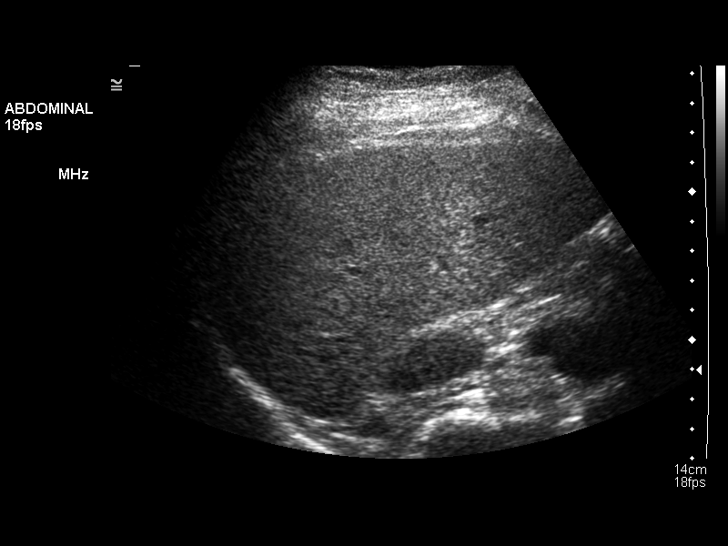
[im 61/91]
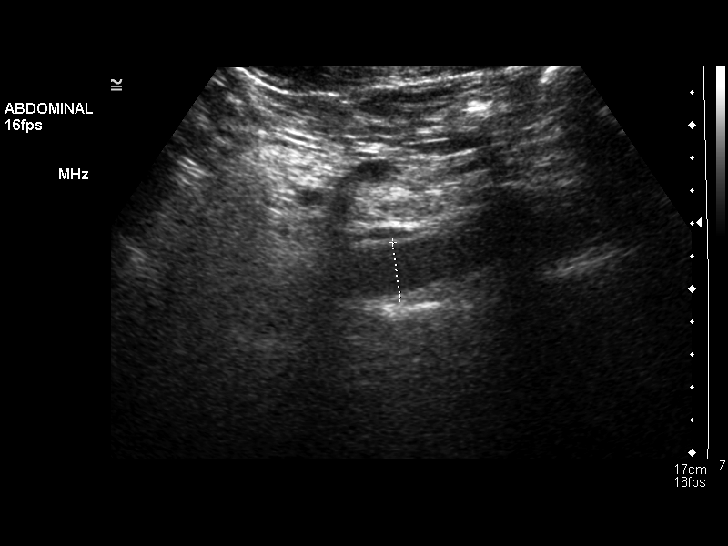
[im 68/91]
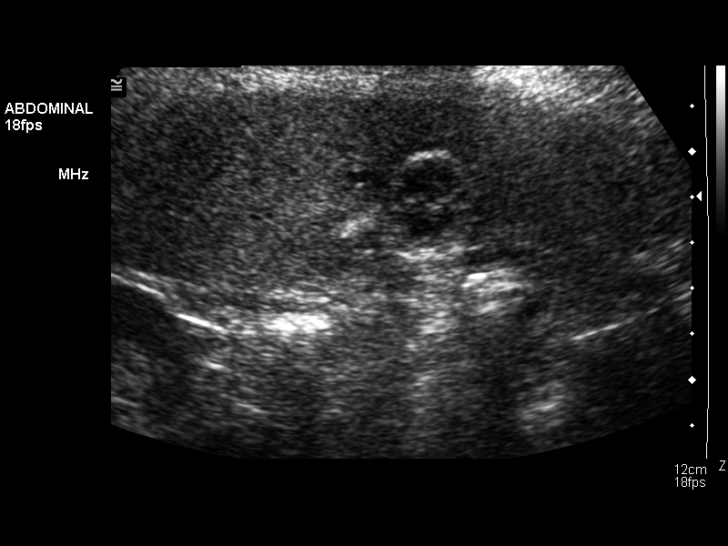
[im 76/91]
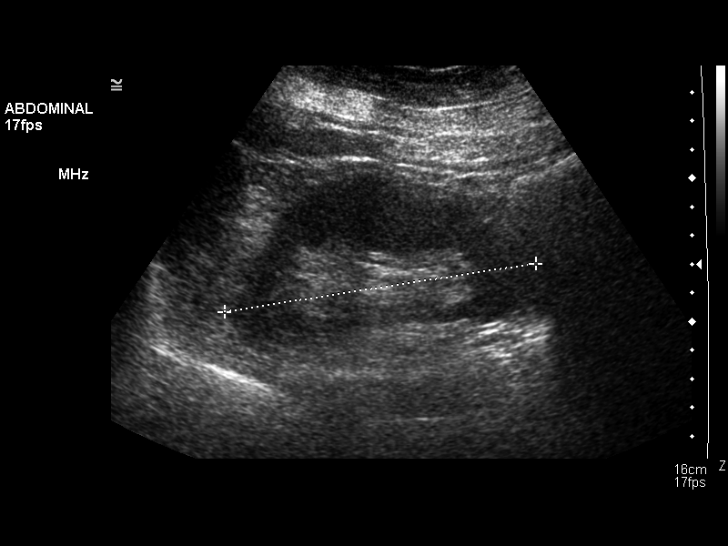
[im 83/91]
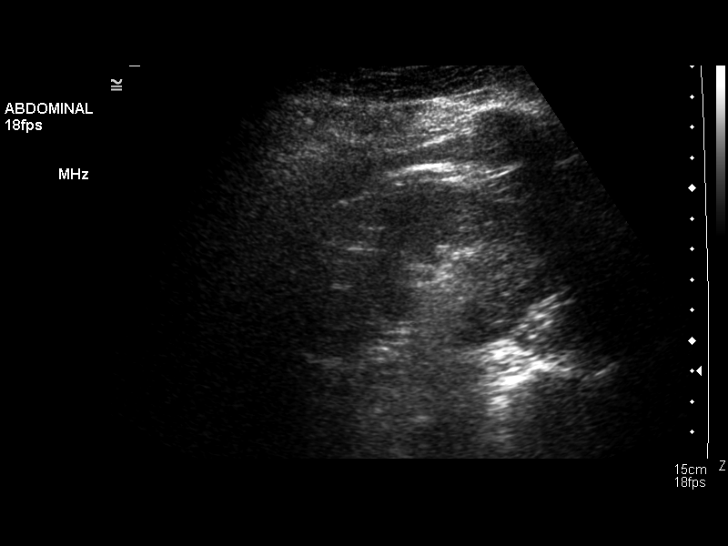
[im 91/91]
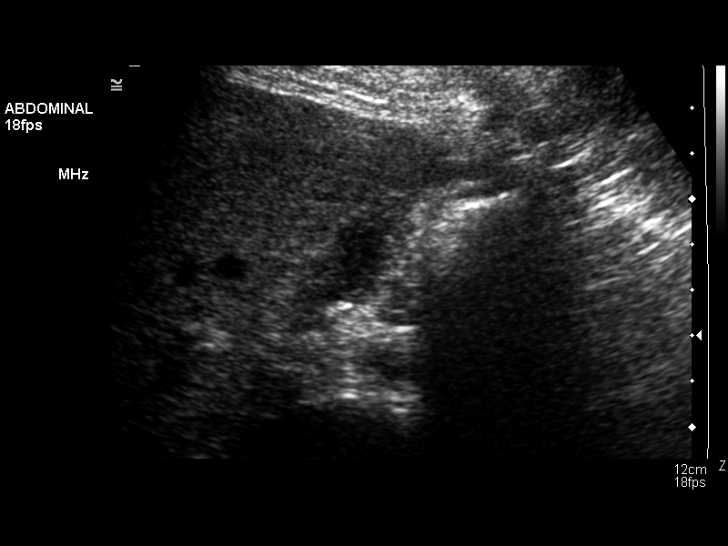

[13 of 25 positions shown; findings below may reference images not displayed]

FINDINGS: Gallbladder:  There are at least two gallstones in the gallbladder,
though larger measuring 1.7 cm the smaller measuring 7 mm with
acoustical shadowing present. There may be a small non mobile
stones adherent to the gallbladder fundus versus sludge. There is
no pain over the gallbladder with compression in the gallbladder
wall is not thickened.

Common bile duct:  The common bile duct is normal measuring 5.3 mm
in diameter.

Liver:  The liver is echogenic and inhomogeneous consistent with
fatty infiltration.  No focal abnormality is seen.

IVC:  The IVC is obscured by overlying bowel gas.

Pancreas:  The pancreas also is obscured by bowel gas.

Spleen:  The spleen is normal measuring 3.1 cm sagittally.

Right Kidney:  No hydronephrosis is seen.  The right kidney
measures 11.3 cm sagittally.

Left Kidney:  No hydronephrosis is noted.  The left kidney measures
11.0 cm.

Abdominal aorta:  The abdominal aorta is normal caliber.
IMPRESSION: 1.  Gallstones, the larger measuring 1.7 cm with acoustical
shadowing.  No pain over the gallbladder with compression.
2.  Fatty infiltration of the liver.  No ductal dilatation.
3.  The pancreas is obscured by bowel gas.

## 2015-04-29 ENCOUNTER — Other Ambulatory Visit: Payer: Self-pay

## 2015-10-28 DIAGNOSIS — N6489 Other specified disorders of breast: Secondary | ICD-10-CM | POA: Diagnosis not present

## 2015-12-21 ENCOUNTER — Encounter (HOSPITAL_COMMUNITY): Payer: Self-pay | Admitting: Emergency Medicine

## 2015-12-21 ENCOUNTER — Ambulatory Visit (HOSPITAL_COMMUNITY)
Admission: EM | Admit: 2015-12-21 | Discharge: 2015-12-21 | Disposition: A | Discharge status: Home / Self Care | Payer: PPO | Attending: Family Medicine | Admitting: Family Medicine

## 2015-12-21 DIAGNOSIS — M609 Myositis, unspecified: Secondary | ICD-10-CM | POA: Diagnosis not present

## 2015-12-21 DIAGNOSIS — M791 Myalgia, unspecified site: Secondary | ICD-10-CM

## 2015-12-21 MED ORDER — DICLOFENAC SODIUM 1 % TD GEL: 1.0000 "application " | Freq: Four times a day (QID) | TRANSDERMAL | Status: DC

## 2015-12-21 MED ORDER — NAPROXEN 375 MG PO TABS: 187.0000 mg | ORAL_TABLET | Freq: Two times a day (BID) | ORAL | Status: DC

## 2015-12-21 NOTE — ED Provider Notes (Signed)
CSN: [id number]     Arrival date & time 12/21/15  1631 History   First MD Initiated Contact with Patient 12/21/15 1747     Chief Complaint  Patient presents with  . Neck Pain   (Consider location/radiation/quality/duration/timing/severity/associated sxs/prior Treatment) HPI Comments: 65 year old female complaining of stiffness in the right side of her neck. She points to the scalene muscle just anterior to the trapezius muscle. She states that it is worse with turning of the neck 4 to the left. There is also pain to the supra and infraspinatus musculature and it radiates down the arm to the elbow. Denies known injury. Denies any known repetitive work. She is not employed and does not perform any lifting.  Patient is a 65 y.o. female presenting with neck pain.  Neck Pain   Past Medical History  Diagnosis Date  . Anemia   . Blood transfusion   . Gallstones   . Constipation   . Allergy   . Asthma     no medications  . Cataract     slight  . GERD (gastroesophageal reflux disease)   . Heart murmur   . Seasonal allergies    Past Surgical History  Procedure Laterality Date  . Uterine fibroid surgery    . Cesarean section    . Abdominal hysterectomy  2004    fibroids--- TAH/BSO   Family History  Problem Relation Age of Onset  . Cancer Father     prostate  . Colon cancer Neg Hx   . Esophageal cancer Neg Hx   . Rectal cancer Neg Hx   . Stomach cancer Cousin     maternal side   . Stomach cancer Maternal Uncle   . Anemia Mother    Social History  Substance Use Topics  . Smoking status: Former Smoker -- 1.50 packs/day for 25 years    Types: Cigarettes    Quit date: 03/02/1988  . Smokeless tobacco: Never Used  . Alcohol Use: No   OB History    Gravida Para Term Preterm AB TAB SAB Ectopic Multiple Living   '6 2 2  4  4   2     '$ Review of Systems  Constitutional: Negative.   HENT: Negative.   Respiratory: Negative.   Cardiovascular: Negative.   Musculoskeletal: Positive  for myalgias, neck pain and neck stiffness.  Neurological: Negative.   Psychiatric/Behavioral: Negative.   All other systems reviewed and are negative.   Allergies  Iron; Penicillins; and Tetracyclines & related  Home Medications   Prior to Admission medications   Medication Sig Start Date End Date Taking? Authorizing Provider  diclofenac sodium (VOLTAREN) 1 % GEL Apply 1 application topically 4 (four) times daily. 12/21/15   Janne Napoleon, NP  hydrocortisone (ANUSOL-HC) 25 MG suppository Place 1 suppository (25 mg total) rectally at bedtime as needed for hemorrhoids. 10/12/12   Jessica D Zehr, PA-C  naproxen (NAPROSYN) 375 MG tablet Take 0.5 tablets (187 mg total) by mouth 2 (two) times daily. prn 12/21/15   Janne Napoleon, NP   Meds Ordered and Administered this Visit  Medications - No data to display  BP 132/76 mmHg  Pulse 72  Temp(Src) 97.1 F (36.2 C) (Oral)  SpO2 97% No data found.   Physical Exam  Constitutional: She is oriented to person, place, and time. She appears well-developed and well-nourished. No distress.  HENT:  Head: Normocephalic and atraumatic.  Eyes: EOM are normal.  Neck: Normal range of motion. Neck supple.  Tenderness to the right  posterior scalene muscle and lesser to the trapezius muscle. Minor tenderness to the ridge of the trapezius. Greatest tenderness to the infraspinatus and supraspinatus musculature. No spinal tenderness, deformity, swelling or discoloration.  Cardiovascular: Normal rate.   Pulmonary/Chest: Effort normal. No respiratory distress.  Musculoskeletal: Normal range of motion. She exhibits tenderness. She exhibits no edema.  Tenderness to the musculature as described above. No tenderness to the deltoid muscle the right shoulder joint, the humerus or elbow. Abduction of the arm above the head to 160.  Lymphadenopathy:    She has no cervical adenopathy.  Neurological: She is alert and oriented to person, place, and time. No cranial nerve  deficit.  Skin: Skin is warm and dry.  Psychiatric: She has a normal mood and affect.  Nursing note and vitals reviewed.   ED Course  Procedures (including critical care time)  Labs Review Labs Reviewed - No data to display  Imaging Review No results found.   Visual Acuity Review  Right Eye Distance:   Left Eye Distance:   Bilateral Distance:    Right Eye Near:   Left Eye Near:    Bilateral Near:         MDM   1. Myalgia   2. Myofasciitis    Recommend applying the diclofenac gel 4 times a day as directed, may also apply the salon POSS menthol patch over the area pain and then heating pad or from a care heating wrap over the area pain. Naprosyn as directed twice a day for pain if needed.     Janne Napoleon, NP 12/21/15 [phone]

## 2015-12-21 NOTE — Discharge Instructions (Signed)
Muscle Pain, Adult Muscle pain (myalgia) may be caused by many things, including:  Overuse or muscle strain, especially if you are not in shape. This is the most common cause of muscle pain.  Injury.  Bruises.  Viruses, such as the flu.  Infectious diseases.  Fibromyalgia, which is a chronic condition that causes muscle tenderness, fatigue, and headache.  Autoimmune diseases, including lupus.  Certain drugs, including ACE inhibitors and statins. Muscle pain may be mild or severe. In most cases, the pain lasts only a short time and goes away without treatment. To diagnose the cause of your muscle pain, your health care provider will take your medical history. This means he or she will ask you when your muscle pain began and what has been happening. If you have not had muscle pain for very long, your health care provider may want to wait before doing much testing. If your muscle pain has lasted a long time, your health care provider may want to run tests right away. If your health care provider thinks your muscle pain may be caused by illness, you may need to have additional tests to rule out certain conditions.  Treatment for muscle pain depends on the cause. Home care is often enough to relieve muscle pain. Your health care provider may also prescribe anti-inflammatory medicine. HOME CARE INSTRUCTIONS Watch your condition for any changes. The following actions may help to lessen any discomfort you are feeling:  Only take over-the-counter or prescription medicines as directed by your health care provider.  Apply ice to the sore muscle:  Put ice in a plastic bag.  Place a towel between your skin and the bag.  Leave the ice on for 15-20 minutes, 3-4 times a day.  You may alternate applying hot and cold packs to the muscle as directed by your health care provider.  If overuse is causing your muscle pain, slow down your activities until the pain goes away.  Remember that it is normal  to feel some muscle pain after starting a workout program. Muscles that have not been used often will be sore at first.  Do regular, gentle exercises if you are not usually active.  Warm up before exercising to lower your risk of muscle pain.  Do not continue working out if the pain is very bad. Bad pain could mean you have injured a muscle. SEEK MEDICAL CARE IF:  Your muscle pain gets worse, and medicines do not help.  You have muscle pain that lasts longer than 3 days.  You have a rash or fever along with muscle pain.  You have muscle pain after a tick bite.  You have muscle pain while working out, even though you are in good physical condition.  You have redness, soreness, or swelling along with muscle pain.  You have muscle pain after starting a new medicine or changing the dose of a medicine. SEEK IMMEDIATE MEDICAL CARE IF:  You have trouble breathing.  You have trouble swallowing.  You have muscle pain along with a stiff neck, fever, and vomiting.  You have severe muscle weakness or cannot move part of your body. MAKE SURE YOU:   Understand these instructions.  Will watch your condition.  Will get help right away if you are not doing well or get worse.   This information is not intended to replace advice given to you by your health care provider. Make sure you discuss any questions you have with your health care provider.   Document Released:  07/22/2006 Document Revised: 09/20/2014 Document Reviewed: 06/26/2013 Elsevier Interactive Patient Education 2016 Elsevier Inc.  Myofascial Pain Syndrome and Fibromyalgia Recommend applying the diclofenac gel 4 times a day as directed, may also apply the salon POSS menthol patch over the area pain and then heating pad or from a care heating wrap over the area pain. Naprosyn as directed twice a day for pain if needed. Myofascial pain syndrome and fibromyalgia are both pain disorders. This pain may be felt mainly in your  muscles.   Myofascial pain syndrome:  Always has trigger points or tender points in the muscle that will cause pain when pressed. The pain may come and go.  Usually affects your neck, upper back, and shoulder areas. The pain often radiates into your arms and hands.  Fibromyalgia:  Has muscle pains and tenderness that come and go.  Is often associated with fatigue and sleep disturbances.  Has trigger points.  Tends to be long-lasting (chronic), but is not life-threatening. Fibromyalgia and myofascial pain are not the same. However, they often occur together. If you have both conditions, each can make the other worse. Both are common and can cause enough pain and fatigue to make day-to-day activities difficult.  CAUSES  The exact causes of fibromyalgia and myofascial pain are not known. People with certain gene types may be more likely to develop fibromyalgia. Some factors can be triggers for both conditions, such as:   Spine disorders.  Arthritis.  Severe injury (trauma) and other physical stressors.  Being under a lot of stress.  A medical illness. SIGNS AND SYMPTOMS  Fibromyalgia The main symptom of fibromyalgia is widespread pain and tenderness in your muscles. This can vary over time. Pain is sometimes described as stabbing, shooting, or burning. You may have tingling or numbness, too. You may also have sleep problems and fatigue. You may wake up feeling tired and groggy (fibro fog). Other symptoms may include:   Bowel and bladder problems.  Headaches.  Visual problems.  Problems with odors and noises.  Depression or mood changes.  Painful menstrual periods (dysmenorrhea).  Dry skin or eyes. Myofascial pain syndrome Symptoms of myofascial pain syndrome include:   Tight, ropy bands of muscle.   Uncomfortable sensations in muscular areas, such as:  Aching.  Cramping.  Burning.  Numbness.  Tingling.   Muscle weakness.  Trouble moving certain muscles  freely (range of motion). DIAGNOSIS  There are no specific tests to diagnose fibromyalgia or myofascial pain syndrome. Both can be hard to diagnose because their symptoms are common in many other conditions. Your health care provider may suspect one or both of these conditions based on your symptoms and medical history. Your health care provider will also do a physical exam.  The key to diagnosing fibromyalgia is having pain, fatigue, and other symptoms for more than three months that cannot be explained by another condition.  The key to diagnosing myofascial pain syndrome is finding trigger points in muscles that are tender and cause pain elsewhere in your body (referred pain). TREATMENT  Treating fibromyalgia and myofascial pain often requires a team of health care providers. This usually starts with your primary provider and a physical therapist. You may also find it helpful to work with alternative health care providers, such as massage therapists or acupuncturists. Treatment for fibromyalgia may include medicines. This may include nonsteroidal anti-inflammatory drugs (NSAIDs), along with other medicines.  Treatment for myofascial pain may also include:  NSAIDs.  Cooling and stretching of muscles.  Trigger point  injections.  Sound wave (ultrasound) treatments to stimulate muscles. HOME CARE INSTRUCTIONS   Take medicines only as directed by your health care provider.  Exercise as directed by your health care provider or physical therapist.  Try to avoid stressful situations.  Practice relaxation techniques to control your stress. You may want to try:  Biofeedback.  Visual imagery.  Hypnosis.  Muscle relaxation.  Yoga.  Meditation.  Talk to your health care provider about alternative treatments, such as acupuncture or massage treatment.  Maintain a healthy lifestyle. This includes eating a healthy diet and getting enough sleep.  Consider joining a support group.  Do not  do activities that stress or strain your muscles. That includes repetitive motions and heavy lifting. SEEK MEDICAL CARE IF:   You have new symptoms.  Your symptoms get worse.  You have side effects from your medicines.  You have trouble sleeping.  Your condition is causing depression or anxiety. FOR MORE INFORMATION   National Fibromyalgia Association: http://www.fmaware.orgwww.fmaware.[place]: http://www.arthritis.orgwww.arthritis.org  American Chronic Pain Association: StreetWrestling.at.https://stevens.biz/   This information is not intended to replace advice given to you by your health care provider. Make sure you discuss any questions you have with your health care provider.   Document Released: 08/30/2005 Document Revised: 09/20/2014 Document Reviewed: 06/05/2014 Elsevier Interactive Patient Education [employer].

## 2015-12-21 NOTE — ED Notes (Signed)
Pt reports pain on neck that radiates down to right elbow onset Wednesday... Reports it radiated initially to both arms Pain increases w/activity... Denies inj/trauma... A&O x4... No acute distress.

## 2016-03-12 ENCOUNTER — Other Ambulatory Visit: Payer: Self-pay | Admitting: Family Medicine

## 2016-03-12 DIAGNOSIS — D649 Anemia, unspecified: Secondary | ICD-10-CM | POA: Diagnosis not present

## 2016-03-12 DIAGNOSIS — K5900 Constipation, unspecified: Secondary | ICD-10-CM | POA: Diagnosis not present

## 2016-03-12 DIAGNOSIS — R1084 Generalized abdominal pain: Secondary | ICD-10-CM

## 2016-03-12 DIAGNOSIS — K8020 Calculus of gallbladder without cholecystitis without obstruction: Secondary | ICD-10-CM

## 2016-03-23 ENCOUNTER — Ambulatory Visit
Admission: RE | Admit: 2016-03-23 | Discharge: 2016-03-23 | Disposition: A | Discharge status: Home / Self Care | Payer: PPO | Source: Ambulatory Visit | Attending: Family Medicine | Admitting: Family Medicine

## 2016-03-23 DIAGNOSIS — K8020 Calculus of gallbladder without cholecystitis without obstruction: Secondary | ICD-10-CM | POA: Diagnosis not present

## 2016-03-23 DIAGNOSIS — R1084 Generalized abdominal pain: Secondary | ICD-10-CM

## 2016-04-08 DIAGNOSIS — K5909 Other constipation: Secondary | ICD-10-CM | POA: Diagnosis not present

## 2016-04-08 DIAGNOSIS — K8020 Calculus of gallbladder without cholecystitis without obstruction: Secondary | ICD-10-CM | POA: Diagnosis not present

## 2016-04-13 DIAGNOSIS — H6523 Chronic serous otitis media, bilateral: Secondary | ICD-10-CM | POA: Diagnosis not present

## 2016-04-13 DIAGNOSIS — J309 Allergic rhinitis, unspecified: Secondary | ICD-10-CM | POA: Diagnosis not present

## 2016-04-13 DIAGNOSIS — R42 Dizziness and giddiness: Secondary | ICD-10-CM | POA: Diagnosis not present

## 2016-04-13 DIAGNOSIS — H6121 Impacted cerumen, right ear: Secondary | ICD-10-CM | POA: Diagnosis not present

## 2016-05-14 DIAGNOSIS — R42 Dizziness and giddiness: Secondary | ICD-10-CM | POA: Diagnosis not present

## 2016-05-14 DIAGNOSIS — J309 Allergic rhinitis, unspecified: Secondary | ICD-10-CM | POA: Diagnosis not present

## 2016-05-14 DIAGNOSIS — H6523 Chronic serous otitis media, bilateral: Secondary | ICD-10-CM | POA: Diagnosis not present

## 2016-06-15 DIAGNOSIS — Z23 Encounter for immunization: Secondary | ICD-10-CM | POA: Diagnosis not present

## 2016-06-15 DIAGNOSIS — K8020 Calculus of gallbladder without cholecystitis without obstruction: Secondary | ICD-10-CM | POA: Diagnosis not present

## 2016-06-16 DIAGNOSIS — K8020 Calculus of gallbladder without cholecystitis without obstruction: Secondary | ICD-10-CM | POA: Diagnosis not present

## 2016-06-16 DIAGNOSIS — D649 Anemia, unspecified: Secondary | ICD-10-CM | POA: Diagnosis not present

## 2016-06-16 DIAGNOSIS — Z13220 Encounter for screening for lipoid disorders: Secondary | ICD-10-CM | POA: Diagnosis not present

## 2016-08-23 DIAGNOSIS — K8020 Calculus of gallbladder without cholecystitis without obstruction: Secondary | ICD-10-CM | POA: Diagnosis not present

## 2016-08-23 DIAGNOSIS — Z1211 Encounter for screening for malignant neoplasm of colon: Secondary | ICD-10-CM | POA: Diagnosis not present

## 2016-08-23 DIAGNOSIS — Z23 Encounter for immunization: Secondary | ICD-10-CM | POA: Diagnosis not present

## 2016-08-23 DIAGNOSIS — Z0000 Encounter for general adult medical examination without abnormal findings: Secondary | ICD-10-CM | POA: Diagnosis not present

## 2016-09-22 DIAGNOSIS — M9903 Segmental and somatic dysfunction of lumbar region: Secondary | ICD-10-CM | POA: Diagnosis not present

## 2016-09-22 DIAGNOSIS — M9901 Segmental and somatic dysfunction of cervical region: Secondary | ICD-10-CM | POA: Diagnosis not present

## 2016-09-22 DIAGNOSIS — M62838 Other muscle spasm: Secondary | ICD-10-CM | POA: Diagnosis not present

## 2016-09-22 DIAGNOSIS — M545 Low back pain: Secondary | ICD-10-CM | POA: Diagnosis not present

## 2016-09-22 DIAGNOSIS — M47817 Spondylosis without myelopathy or radiculopathy, lumbosacral region: Secondary | ICD-10-CM | POA: Diagnosis not present

## 2016-09-22 DIAGNOSIS — R293 Abnormal posture: Secondary | ICD-10-CM | POA: Diagnosis not present

## 2016-09-22 DIAGNOSIS — M2560 Stiffness of unspecified joint, not elsewhere classified: Secondary | ICD-10-CM | POA: Diagnosis not present

## 2016-09-22 DIAGNOSIS — M542 Cervicalgia: Secondary | ICD-10-CM | POA: Diagnosis not present

## 2016-09-23 ENCOUNTER — Ambulatory Visit: Payer: Self-pay | Admitting: General Surgery

## 2016-09-23 DIAGNOSIS — K8020 Calculus of gallbladder without cholecystitis without obstruction: Secondary | ICD-10-CM | POA: Diagnosis not present

## 2016-09-23 NOTE — H&P (Signed)
ROLINDA IMPSON 09/23/2016 1:46 PM Location: [facility] Patient #: [id number] DOB: 1951/03/08 Married / Language: English / Race: Black or African American Female  History of Present Illness Odis Hollingshead MD; 09/23/2016 2:10 PM) The patient is a 66 year old female.   Note:She presents today for a second opinion regarding her symptomatic cholelithiasis. She continues to have intermittent right upper quadrant pain depending on what she eats. Her trigger foods are mostly dairy products. She also has some spasmodic type mid back pain when she bends over. I explained to her I did not think this was related to her gallbladder. She is interested in having cholecystectomy now.  Medication History (April Staton, CMA; 09/23/2016 1:49 PM) Inhaler Decongestant (Inhalation) Specific strength unknown - Active. (allopurinol inhaler) No Current Medications (Taken starting 09/23/2016) Medications Reconciled    Vitals (April Staton CMA; 09/23/2016 1:49 PM) 09/23/2016 1:49 PM Weight: 230.38 lb Height: 64in Body Surface Area: 2.08 m Body Mass Index: 39.54 kg/m  Temp.: 98.15F(Oral)  Pulse: 83 (Regular)  BP: 130/74 (Sitting, Left Arm, Standard)      Physical Exam Odis Hollingshead MD; 09/23/2016 2:12 PM)  The physical exam findings are as follows: Note:GENERAL APPEARANCE: Obese female in NAD. Pleasant and cooperative.  EARS, NOSE, MOUTH THROAT: [place]/AT external ears: no lesions or deformities external nose: no lesions or deformities hearing: grossly normal lips: moist, no deformities  EYES external: conjunctiva, lids, sclerae normal pupils: equal, round glasses: yes  CV ascultation: RRR, no murmur extremity edema: no carotid bruit: no  RESP auscultation: breath sounds equal and clear respiratory effort: normal  GASTROINTESTINAL abdomen: Soft, non-tender, non-distended, no masses liver and spleen: not enlarged. hernia: none present scar:  subumbilica  NEUROPSYCHIATRIC alertness and orientation: normal speech: normal mood/affect/behavior: normal judgement and insight: normal    Assessment & Plan Odis Hollingshead MD; 09/23/2016 2:13 PM)  SYMPTOMATIC CHOLELITHIASIS (K80.20) Impression: Her symptoms continue depending on what she eats. She is interested in cholecystectomy. I explained to her that her back pain was musculoskeletal in my opinion.  Plan: Laparoscopic cholecystectomy with cholangiogram. I have explained the procedure, risks, and aftercare of cholecystectomy. Risks include but are not limited to bleeding, infection, wound problems, anesthesia, diarrhea, bile leak, injury to common bile duct/liver/intestine. She seems to understand and agrees to proceed.  Jackolyn Confer, M.D.

## 2016-10-22 NOTE — Patient Instructions (Addendum)
SHABANA ARMENTROUT  10/22/2016   Your procedure is scheduled on: Thursday 10/28/2016  Report to [facility] Main  Entrance take [place]  elevators to 3rd floor to  [place] at  [place]  AM.  Call this number if you have problems the morning of surgery [phone]    Remember: ONLY 1 PERSON MAY GO WITH YOU TO SHORT STAY TO GET  READY MORNING OF [place].    Do not eat food or drink liquids :After Midnight.     Take these medicines the morning of surgery with A SIP OF WATER: use Albuterol inhaler if needed, take Singulair if needed                                You may not have any metal on your body including hair pins and              piercings  Do not wear jewelry, make-up, lotions, powders or perfumes, deodorant             Do not wear nail polish.  Do not shave  48 hours prior to surgery.              Men may shave face and neck.   Do not bring valuables to the hospital. [place].  Contacts, dentures or bridgework may not be worn into surgery.  Leave suitcase in the car. After surgery it may be brought to your room.     Patients discharged the day of surgery will not be allowed to drive home.  Name and phone number of your driver:Pastor and friend- Enid Derry and spouse- Ritchie  Special Instructions: N/A              Please read over the following fact sheets you were given: _____________________________________________________________________             [facility] - Preparing for Surgery Before surgery, you can play an important role.  Because skin is not sterile, your skin needs to be as free of germs as possible.  You can reduce the number of germs on your skin by washing with CHG (chlorahexidine gluconate) soap before surgery.  CHG is an antiseptic cleaner which kills germs and bonds with the skin to continue killing germs even after washing. Please DO NOT use if you have an allergy to CHG or  antibacterial soaps.  If your skin becomes reddened/irritated stop using the CHG and inform your nurse when you arrive at Short Stay. Do not shave (including legs and underarms) for at least 48 hours prior to the first CHG shower.  You may shave your face/neck. Please follow these instructions carefully:  1.  Shower with CHG Soap the night before surgery and the  morning of Surgery.  2.  If you choose to wash your hair, wash your hair first as usual with your  normal  shampoo.  3.  After you shampoo, rinse your hair and body thoroughly to remove the  shampoo.                           4.  Use CHG as you would any other liquid soap.  You can  apply chg directly  to the skin and wash                       Gently with a scrungie or clean washcloth.  5.  Apply the CHG Soap to your body ONLY FROM THE NECK DOWN.   Do not use on face/ open                           Wound or open sores. Avoid contact with eyes, ears mouth and genitals (private parts).                       Wash face,  Genitals (private parts) with your normal soap.             6.  Wash thoroughly, paying special attention to the area where your surgery  will be performed.  7.  Thoroughly rinse your body with warm water from the neck down.  8.  DO NOT shower/wash with your normal soap after using and rinsing off  the CHG Soap.                9.  Pat yourself dry with a clean towel.            10.  Wear clean pajamas.            11.  Place clean sheets on your bed the night of your first shower and do not  sleep with pets. Day of Surgery : Do not apply any lotions/deodorants the morning of surgery.  Please wear clean clothes to the hospital/surgery center.  FAILURE TO FOLLOW THESE INSTRUCTIONS MAY RESULT IN THE CANCELLATION OF YOUR SURGERY PATIENT SIGNATURE_________________________________  NURSE SIGNATURE__________________________________  ________________________________________________________________________

## 2016-10-25 ENCOUNTER — Encounter (HOSPITAL_COMMUNITY)
Admission: RE | Admit: 2016-10-25 | Discharge: 2016-10-25 | Disposition: A | Discharge status: Home / Self Care | Payer: BLUE CROSS/BLUE SHIELD | Source: Ambulatory Visit | Attending: General Surgery | Admitting: General Surgery

## 2016-10-25 ENCOUNTER — Encounter (HOSPITAL_COMMUNITY): Payer: Self-pay

## 2016-10-25 DIAGNOSIS — Z01812 Encounter for preprocedural laboratory examination: Secondary | ICD-10-CM | POA: Insufficient documentation

## 2016-10-25 DIAGNOSIS — K8020 Calculus of gallbladder without cholecystitis without obstruction: Secondary | ICD-10-CM | POA: Diagnosis not present

## 2016-10-25 HISTORY — DX: Other complications of anesthesia, initial encounter: T88.59XA

## 2016-10-25 HISTORY — DX: Adverse effect of unspecified anesthetic, initial encounter: T41.45XA

## 2016-10-25 LAB — CBC WITH DIFFERENTIAL/PLATELET
Basophils Absolute: 0 10*3/uL (ref 0.0–0.1)
Basophils Relative: 0 %
Eosinophils Absolute: 0.1 10*3/uL (ref 0.0–0.7)
Eosinophils Relative: 1 %
HCT: 38.9 % (ref 36.0–46.0)
Hemoglobin: 12.8 g/dL (ref 12.0–15.0)
Lymphocytes Relative: 31 %
Lymphs Abs: 1.5 10*3/uL (ref 0.7–4.0)
MCH: 28.8 pg (ref 26.0–34.0)
MCHC: 32.9 g/dL (ref 30.0–36.0)
MCV: 87.4 fL (ref 78.0–100.0)
Monocytes Absolute: 0.4 10*3/uL (ref 0.1–1.0)
Monocytes Relative: 8 %
Neutro Abs: 2.8 10*3/uL (ref 1.7–7.7)
Neutrophils Relative %: 60 %
Platelets: 211 10*3/uL (ref 150–400)
RBC: 4.45 MIL/uL (ref 3.87–5.11)
RDW: 13.1 % (ref 11.5–15.5)
WBC: 4.8 10*3/uL (ref 4.0–10.5)

## 2016-10-25 LAB — COMPREHENSIVE METABOLIC PANEL
ALT: 25 U/L (ref 14–54)
AST: 27 U/L (ref 15–41)
Albumin: 4.1 g/dL (ref 3.5–5.0)
Alkaline Phosphatase: 64 U/L (ref 38–126)
Anion gap: 5 (ref 5–15)
BUN: 10 mg/dL (ref 6–20)
CO2: 28 mmol/L (ref 22–32)
Calcium: 9.2 mg/dL (ref 8.9–10.3)
Chloride: 108 mmol/L (ref 101–111)
Creatinine, Ser: 0.68 mg/dL (ref 0.44–1.00)
GFR calc Af Amer: >60 mL/min (ref >60)
GFR calc non Af Amer: >60 mL/min (ref >60)
Glucose, Bld: 85 mg/dL (ref 65–99)
Potassium: 4.1 mmol/L (ref 3.5–5.1)
Sodium: 141 mmol/L (ref 135–145)
Total Bilirubin: 0.8 mg/dL (ref 0.3–1.2)
Total Protein: 6.7 g/dL (ref 6.5–8.1)

## 2016-10-28 ENCOUNTER — Ambulatory Visit (HOSPITAL_COMMUNITY): Payer: BLUE CROSS/BLUE SHIELD

## 2016-10-28 ENCOUNTER — Encounter (HOSPITAL_COMMUNITY)
Admission: AD | Disposition: A | Discharge status: Home / Self Care | Payer: Self-pay | Source: Ambulatory Visit | Attending: General Surgery

## 2016-10-28 ENCOUNTER — Encounter (HOSPITAL_COMMUNITY): Payer: Self-pay | Admitting: *Deleted

## 2016-10-28 ENCOUNTER — Ambulatory Visit (HOSPITAL_COMMUNITY): Payer: BLUE CROSS/BLUE SHIELD | Admitting: Certified Registered Nurse Anesthetist

## 2016-10-28 ENCOUNTER — Inpatient Hospital Stay (HOSPITAL_COMMUNITY)
Admission: AD | Admit: 2016-10-28 | Discharge: 2016-11-04 | DRG: 417 | Disposition: A | Discharge status: Home / Self Care | Payer: BLUE CROSS/BLUE SHIELD | Source: Ambulatory Visit | Attending: General Surgery | Admitting: General Surgery

## 2016-10-28 DIAGNOSIS — K8044 Calculus of bile duct with chronic cholecystitis without obstruction: Secondary | ICD-10-CM | POA: Diagnosis present

## 2016-10-28 DIAGNOSIS — Z888 Allergy status to other drugs, medicaments and biological substances status: Secondary | ICD-10-CM | POA: Diagnosis not present

## 2016-10-28 DIAGNOSIS — J45909 Unspecified asthma, uncomplicated: Secondary | ICD-10-CM | POA: Diagnosis present

## 2016-10-28 DIAGNOSIS — E669 Obesity, unspecified: Secondary | ICD-10-CM | POA: Diagnosis present

## 2016-10-28 DIAGNOSIS — K8020 Calculus of gallbladder without cholecystitis without obstruction: Secondary | ICD-10-CM | POA: Diagnosis not present

## 2016-10-28 DIAGNOSIS — K660 Peritoneal adhesions (postprocedural) (postinfection): Secondary | ICD-10-CM | POA: Diagnosis not present

## 2016-10-28 DIAGNOSIS — Z880 Allergy status to penicillin: Secondary | ICD-10-CM

## 2016-10-28 DIAGNOSIS — K859 Acute pancreatitis without necrosis or infection, unspecified: Secondary | ICD-10-CM | POA: Diagnosis not present

## 2016-10-28 DIAGNOSIS — R932 Abnormal findings on diagnostic imaging of liver and biliary tract: Secondary | ICD-10-CM | POA: Diagnosis not present

## 2016-10-28 DIAGNOSIS — K8010 Calculus of gallbladder with chronic cholecystitis without obstruction: Secondary | ICD-10-CM | POA: Diagnosis not present

## 2016-10-28 DIAGNOSIS — K8050 Calculus of bile duct without cholangitis or cholecystitis without obstruction: Secondary | ICD-10-CM | POA: Diagnosis not present

## 2016-10-28 DIAGNOSIS — Z87891 Personal history of nicotine dependence: Secondary | ICD-10-CM

## 2016-10-28 DIAGNOSIS — K219 Gastro-esophageal reflux disease without esophagitis: Secondary | ICD-10-CM | POA: Diagnosis not present

## 2016-10-28 DIAGNOSIS — K8060 Calculus of gallbladder and bile duct with cholecystitis, unspecified, without obstruction: Secondary | ICD-10-CM | POA: Diagnosis not present

## 2016-10-28 DIAGNOSIS — K8070 Calculus of gallbladder and bile duct without cholecystitis without obstruction: Secondary | ICD-10-CM | POA: Diagnosis not present

## 2016-10-28 DIAGNOSIS — E8770 Fluid overload, unspecified: Secondary | ICD-10-CM | POA: Diagnosis not present

## 2016-10-28 DIAGNOSIS — Z419 Encounter for procedure for purposes other than remedying health state, unspecified: Secondary | ICD-10-CM

## 2016-10-28 DIAGNOSIS — R110 Nausea: Secondary | ICD-10-CM | POA: Diagnosis not present

## 2016-10-28 DIAGNOSIS — Z9889 Other specified postprocedural states: Secondary | ICD-10-CM | POA: Diagnosis not present

## 2016-10-28 DIAGNOSIS — Z800 Family history of malignant neoplasm of digestive organs: Secondary | ICD-10-CM

## 2016-10-28 DIAGNOSIS — E876 Hypokalemia: Secondary | ICD-10-CM | POA: Diagnosis not present

## 2016-10-28 DIAGNOSIS — K5900 Constipation, unspecified: Secondary | ICD-10-CM | POA: Diagnosis not present

## 2016-10-28 DIAGNOSIS — R748 Abnormal levels of other serum enzymes: Secondary | ICD-10-CM | POA: Diagnosis not present

## 2016-10-28 DIAGNOSIS — Z90710 Acquired absence of both cervix and uterus: Secondary | ICD-10-CM

## 2016-10-28 DIAGNOSIS — Y848 Other medical procedures as the cause of abnormal reaction of the patient, or of later complication, without mention of misadventure at the time of the procedure: Secondary | ICD-10-CM | POA: Diagnosis not present

## 2016-10-28 DIAGNOSIS — K8581 Other acute pancreatitis with uninfected necrosis: Secondary | ICD-10-CM | POA: Diagnosis not present

## 2016-10-28 DIAGNOSIS — K858 Other acute pancreatitis without necrosis or infection: Secondary | ICD-10-CM | POA: Diagnosis not present

## 2016-10-28 DIAGNOSIS — R1013 Epigastric pain: Secondary | ICD-10-CM | POA: Diagnosis not present

## 2016-10-28 HISTORY — PX: CHOLECYSTECTOMY: SHX55

## 2016-10-28 SURGERY — LAPAROSCOPIC CHOLECYSTECTOMY WITH INTRAOPERATIVE CHOLANGIOGRAM
Anesthesia: General | Site: Abdomen

## 2016-10-28 MED ORDER — IOPAMIDOL (ISOVUE-300) INJECTION 61%
INTRAVENOUS | Status: DC | PRN
  Administered 2016-10-28: 14 mL

## 2016-10-28 MED ORDER — ALBUTEROL SULFATE (2.5 MG/3ML) 0.083% IN NEBU: 3.0000 mL | INHALATION_SOLUTION | RESPIRATORY_TRACT | Status: DC | PRN

## 2016-10-28 MED ORDER — SUGAMMADEX SODIUM 200 MG/2ML IV SOLN
INTRAVENOUS | Status: AC
  Filled 2016-10-28: qty 2

## 2016-10-28 MED ORDER — LACTATED RINGERS IV SOLN
INTRAVENOUS | Status: DC | PRN
  Administered 2016-10-28: 1 via INTRAVENOUS

## 2016-10-28 MED ORDER — ONDANSETRON 4 MG PO TBDP: 4.0000 mg | ORAL_TABLET | Freq: Four times a day (QID) | ORAL | Status: DC | PRN

## 2016-10-28 MED ORDER — BUPIVACAINE HCL 0.5 % IJ SOLN
INTRAMUSCULAR | Status: DC | PRN
  Administered 2016-10-28: 13 mL

## 2016-10-28 MED ORDER — MIDAZOLAM HCL 2 MG/2ML IJ SOLN
INTRAMUSCULAR | Status: AC
  Filled 2016-10-28: qty 2

## 2016-10-28 MED ORDER — MORPHINE SULFATE (PF) 4 MG/ML IV SOLN
2.0000 mg | INTRAVENOUS | Status: DC | PRN
  Administered 2016-10-28 – 2016-10-29 (×6): 2 mg via INTRAVENOUS
  Administered 2016-10-30 (×4): 4 mg via INTRAVENOUS
  Filled 2016-10-28 (×10): qty 1

## 2016-10-28 MED ORDER — HYDROMORPHONE HCL 1 MG/ML IJ SOLN
INTRAMUSCULAR | Status: AC
  Filled 2016-10-28: qty 1

## 2016-10-28 MED ORDER — CIPROFLOXACIN IN D5W 400 MG/200ML IV SOLN
400.0000 mg | INTRAVENOUS | Status: AC
  Administered 2016-10-28: 400 mg via INTRAVENOUS

## 2016-10-28 MED ORDER — SUCCINYLCHOLINE CHLORIDE 200 MG/10ML IV SOSY
PREFILLED_SYRINGE | INTRAVENOUS | Status: AC
  Filled 2016-10-28: qty 10

## 2016-10-28 MED ORDER — SUCCINYLCHOLINE CHLORIDE 200 MG/10ML IV SOSY
PREFILLED_SYRINGE | INTRAVENOUS | Status: DC | PRN
Start: 2016-10-28 — End: 2016-10-28
  Administered 2016-10-28: 100 mg via INTRAVENOUS

## 2016-10-28 MED ORDER — PROMETHAZINE HCL 25 MG/ML IJ SOLN: 6.2500 mg | INTRAMUSCULAR | Status: DC | PRN

## 2016-10-28 MED ORDER — LIDOCAINE 2% (20 MG/ML) 5 ML SYRINGE
INTRAMUSCULAR | Status: DC | PRN
  Administered 2016-10-28: 100 mg via INTRAVENOUS

## 2016-10-28 MED ORDER — ONDANSETRON HCL 4 MG/2ML IJ SOLN
INTRAMUSCULAR | Status: DC | PRN
  Administered 2016-10-28: 4 mg via INTRAVENOUS

## 2016-10-28 MED ORDER — ONDANSETRON HCL 4 MG/2ML IJ SOLN
INTRAMUSCULAR | Status: AC
  Filled 2016-10-28: qty 2

## 2016-10-28 MED ORDER — LACTATED RINGERS IV SOLN
INTRAVENOUS | Status: DC
  Administered 2016-10-28: 10:00:00 via INTRAVENOUS

## 2016-10-28 MED ORDER — LIDOCAINE 2% (20 MG/ML) 5 ML SYRINGE
INTRAMUSCULAR | Status: AC
  Filled 2016-10-28: qty 5

## 2016-10-28 MED ORDER — OXYCODONE HCL 5 MG PO TABS
5.0000 mg | ORAL_TABLET | ORAL | Status: DC | PRN
  Administered 2016-10-29 (×2): 5 mg via ORAL
  Administered 2016-10-31 – 2016-11-01 (×4): 10 mg via ORAL
  Filled 2016-10-28 (×4): qty 2
  Filled 2016-10-28: qty 1
  Filled 2016-10-28: qty 2
  Filled 2016-10-28 (×2): qty 1

## 2016-10-28 MED ORDER — ROCURONIUM BROMIDE 50 MG/5ML IV SOSY
PREFILLED_SYRINGE | INTRAVENOUS | Status: AC
  Filled 2016-10-28: qty 5

## 2016-10-28 MED ORDER — CHLORHEXIDINE GLUCONATE CLOTH 2 % EX PADS: 6.0000 | MEDICATED_PAD | Freq: Once | CUTANEOUS | Status: DC

## 2016-10-28 MED ORDER — IOPAMIDOL (ISOVUE-300) INJECTION 61%
INTRAVENOUS | Status: AC
  Filled 2016-10-28: qty 50

## 2016-10-28 MED ORDER — ALBUTEROL SULFATE HFA 108 (90 BASE) MCG/ACT IN AERS
INHALATION_SPRAY | RESPIRATORY_TRACT | Status: DC | PRN
  Administered 2016-10-28 (×2): 5 via RESPIRATORY_TRACT

## 2016-10-28 MED ORDER — MIDAZOLAM HCL 5 MG/5ML IJ SOLN
INTRAMUSCULAR | Status: DC | PRN
  Administered 2016-10-28: 2 mg via INTRAVENOUS

## 2016-10-28 MED ORDER — EPHEDRINE SULFATE 50 MG/ML IJ SOLN
INTRAMUSCULAR | Status: DC | PRN
  Administered 2016-10-28 (×3): 5 mg via INTRAVENOUS

## 2016-10-28 MED ORDER — LACTATED RINGERS IV SOLN
INTRAVENOUS | Status: DC | PRN
  Administered 2016-10-28: 07:00:00 via INTRAVENOUS

## 2016-10-28 MED ORDER — DEXAMETHASONE SODIUM PHOSPHATE 10 MG/ML IJ SOLN
INTRAMUSCULAR | Status: AC
  Filled 2016-10-28: qty 1

## 2016-10-28 MED ORDER — ALBUTEROL SULFATE HFA 108 (90 BASE) MCG/ACT IN AERS
INHALATION_SPRAY | RESPIRATORY_TRACT | Status: AC
  Filled 2016-10-28: qty 6.7

## 2016-10-28 MED ORDER — HYDROMORPHONE HCL 1 MG/ML IJ SOLN
0.2500 mg | INTRAMUSCULAR | Status: DC | PRN
  Administered 2016-10-28 (×4): 0.5 mg via INTRAVENOUS

## 2016-10-28 MED ORDER — ONDANSETRON HCL 4 MG/2ML IJ SOLN
4.0000 mg | INTRAMUSCULAR | Status: DC | PRN
  Administered 2016-10-29: 4 mg via INTRAVENOUS
  Filled 2016-10-28: qty 2

## 2016-10-28 MED ORDER — ROCURONIUM BROMIDE 50 MG/5ML IV SOSY
PREFILLED_SYRINGE | INTRAVENOUS | Status: DC | PRN
  Administered 2016-10-28: 15 mg via INTRAVENOUS
  Administered 2016-10-28: 35 mg via INTRAVENOUS

## 2016-10-28 MED ORDER — EPHEDRINE 5 MG/ML INJ
INTRAVENOUS | Status: AC
  Filled 2016-10-28: qty 10

## 2016-10-28 MED ORDER — DEXAMETHASONE SODIUM PHOSPHATE 10 MG/ML IJ SOLN
INTRAMUSCULAR | Status: DC | PRN
  Administered 2016-10-28: 10 mg via INTRAVENOUS

## 2016-10-28 MED ORDER — BUPIVACAINE HCL (PF) 0.5 % IJ SOLN
INTRAMUSCULAR | Status: AC
  Filled 2016-10-28: qty 30

## 2016-10-28 MED ORDER — FENTANYL CITRATE (PF) 100 MCG/2ML IJ SOLN
INTRAMUSCULAR | Status: DC | PRN
  Administered 2016-10-28 (×4): 50 ug via INTRAVENOUS

## 2016-10-28 MED ORDER — CIPROFLOXACIN IN D5W 400 MG/200ML IV SOLN
INTRAVENOUS | Status: AC
  Filled 2016-10-28: qty 200

## 2016-10-28 MED ORDER — KCL IN DEXTROSE-NACL 20-5-0.9 MEQ/L-%-% IV SOLN
INTRAVENOUS | Status: DC
  Administered 2016-10-28 – 2016-10-30 (×5): via INTRAVENOUS
  Filled 2016-10-28 (×6): qty 1000

## 2016-10-28 MED ORDER — FENTANYL CITRATE (PF) 100 MCG/2ML IJ SOLN
INTRAMUSCULAR | Status: AC
  Filled 2016-10-28: qty 4

## 2016-10-28 MED ORDER — PROPOFOL 10 MG/ML IV BOLUS
INTRAVENOUS | Status: AC
  Filled 2016-10-28: qty 20

## 2016-10-28 MED ORDER — PROPOFOL 10 MG/ML IV BOLUS
INTRAVENOUS | Status: DC | PRN
  Administered 2016-10-28: 150 mg via INTRAVENOUS

## 2016-10-28 MED ORDER — CIPROFLOXACIN IN D5W 400 MG/200ML IV SOLN
400.0000 mg | Freq: Two times a day (BID) | INTRAVENOUS | Status: AC
  Administered 2016-10-28: 400 mg via INTRAVENOUS
  Filled 2016-10-28: qty 200

## 2016-10-28 SURGICAL SUPPLY — 44 items
APL SKNCLS STERI-STRIP NONHPOA (GAUZE/BANDAGES/DRESSINGS) ×1
APPLIER CLIP 5 13 M/L LIGAMAX5 (MISCELLANEOUS) ×2
APR CLP MED LRG 5 ANG JAW (MISCELLANEOUS) ×1
BAG SPEC RTRVL 10 TROC 200 (ENDOMECHANICALS) ×1
BAG SPEC RTRVL LRG 6X4 10 (ENDOMECHANICALS)
BENZOIN TINCTURE PRP APPL 2/3 (GAUZE/BANDAGES/DRESSINGS) ×2 IMPLANT
CABLE HIGH FREQUENCY MONO STRZ (ELECTRODE) ×3 IMPLANT
CATH REDDICK CHOLANGI 4FR 50CM (CATHETERS) ×2 IMPLANT
CHLORAPREP W/TINT 26ML (MISCELLANEOUS) ×2 IMPLANT
CLIP APPLIE 5 13 M/L LIGAMAX5 (MISCELLANEOUS) ×1 IMPLANT
COVER MAYO STAND STRL (DRAPES) ×2 IMPLANT
COVER SURGICAL LIGHT HANDLE (MISCELLANEOUS) ×2 IMPLANT
DECANTER SPIKE VIAL GLASS SM (MISCELLANEOUS) ×2 IMPLANT
DISSECTOR BLUNT TIP ENDO 5MM (MISCELLANEOUS) IMPLANT
DRAPE C-ARM 42X120 X-RAY (DRAPES) ×2 IMPLANT
DRSG TEGADERM 2-3/8X2-3/4 SM (GAUZE/BANDAGES/DRESSINGS) ×8 IMPLANT
ELECT REM PT RETURN 9FT ADLT (ELECTROSURGICAL) ×2
ELECTRODE REM PT RTRN 9FT ADLT (ELECTROSURGICAL) ×1 IMPLANT
ENDOLOOP SUT PDS II  0 18 (SUTURE)
ENDOLOOP SUT PDS II 0 18 (SUTURE) IMPLANT
GAUZE SPONGE 2X2 8PLY STRL LF (GAUZE/BANDAGES/DRESSINGS) ×1 IMPLANT
GLOVE ECLIPSE 8.0 STRL XLNG CF (GLOVE) ×2 IMPLANT
GLOVE INDICATOR 8.0 STRL GRN (GLOVE) ×2 IMPLANT
GOWN STRL REUS W/TWL XL LVL3 (GOWN DISPOSABLE) ×6 IMPLANT
HEMOSTAT SNOW SURGICEL 2X4 (HEMOSTASIS) ×1 IMPLANT
IRRIG SUCT STRYKERFLOW 2 WTIP (MISCELLANEOUS) ×2
IRRIGATION SUCT STRKRFLW 2 WTP (MISCELLANEOUS) ×1 IMPLANT
IV CATH 14GX2 1/4 (CATHETERS) ×2 IMPLANT
KIT BASIN OR (CUSTOM PROCEDURE TRAY) ×2 IMPLANT
POUCH RETRIEVAL ECOSAC 10 (ENDOMECHANICALS) IMPLANT
POUCH RETRIEVAL ECOSAC 10MM (ENDOMECHANICALS) ×1
POUCH SPECIMEN RETRIEVAL 10MM (ENDOMECHANICALS) IMPLANT
SCISSORS LAP 5X35 DISP (ENDOMECHANICALS) ×2 IMPLANT
SLEEVE XCEL OPT CAN 5 100 (ENDOMECHANICALS) ×4 IMPLANT
SPONGE GAUZE 2X2 STER 10/PKG (GAUZE/BANDAGES/DRESSINGS) ×1
STRIP CLOSURE SKIN 1/2X4 (GAUZE/BANDAGES/DRESSINGS) ×2 IMPLANT
SUT MNCRL AB 4-0 PS2 18 (SUTURE) ×2 IMPLANT
TOWEL OR 17X26 10 PK STRL BLUE (TOWEL DISPOSABLE) ×2 IMPLANT
TOWEL OR NON WOVEN STRL DISP B (DISPOSABLE) IMPLANT
TRAY LAPAROSCOPIC (CUSTOM PROCEDURE TRAY) ×2 IMPLANT
TROCAR BLADELESS OPT 5 100 (ENDOMECHANICALS) ×2 IMPLANT
TROCAR XCEL BLUNT TIP 100MML (ENDOMECHANICALS) ×2 IMPLANT
TROCAR XCEL NON-BLD 11X100MML (ENDOMECHANICALS) IMPLANT
TUBING INSUF HEATED (TUBING) ×2 IMPLANT

## 2016-10-28 NOTE — Transfer of Care (Signed)
Immediate Anesthesia Transfer of Care Note  Patient: Sarah Lee  Procedure(s) Performed: Procedure(s): LAPAROSCOPIC CHOLECYSTECTOMY WITH INTRAOPERATIVE CHOLANGIOGRAM (N/A)  Patient Location: PACU  Anesthesia Type:General  Level of Consciousness:  sedated, patient cooperative and responds to stimulation  Airway & Oxygen Therapy:Patient Spontanous Breathing and Patient connected to face mask oxgen  Post-op Assessment:  Report given to PACU RN and Post -op Vital signs reviewed and stable  Post vital signs:  Reviewed and stable  Last Vitals:  Vitals:   10/28/16 0538  BP: 138/72  Pulse: 68  Resp: 16  Temp: 03.1 C    Complications: No apparent anesthesia complications

## 2016-10-28 NOTE — Anesthesia Postprocedure Evaluation (Addendum)
Anesthesia Post Note  Patient: MAYME PROFETA  Procedure(s) Performed: Procedure(s) (LRB): LAPAROSCOPIC CHOLECYSTECTOMY WITH INTRAOPERATIVE CHOLANGIOGRAM (N/A)  Patient location during evaluation: PACU Anesthesia Type: General Level of consciousness: awake, sedated and oriented Pain management: pain level controlled Vital Signs Assessment: post-procedure vital signs reviewed and stable Respiratory status: spontaneous breathing, nonlabored ventilation, respiratory function stable and patient connected to nasal cannula oxygen Cardiovascular status: blood pressure returned to baseline and stable Postop Assessment: no signs of nausea or vomiting Anesthetic complications: no       Last Vitals:  Vitals:   10/28/16 1150 10/28/16 1257  BP: 112/66 123/65  Pulse: 88 96  Resp: 16 18  Temp: 36.8 C 36.4 C    Last Pain:  Vitals:   10/28/16 1257  TempSrc: Oral  PainSc:                  Emilian Stawicki,JAMES TERRILL

## 2016-10-28 NOTE — Interval H&P Note (Signed)
History and Physical Interval Note:  10/28/2016 7:20 AM  Sarah Lee  has presented today for surgery, with the diagnosis of symptomatic gallstones  The various methods of treatment have been discussed with the patient and family. After consideration of risks, benefits and other options for treatment, the patient has consented to  Procedure(s): LAPAROSCOPIC CHOLECYSTECTOMY WITH INTRAOPERATIVE CHOLANGIOGRAM (N/A) as a surgical intervention .  The patient's history has been reviewed, patient examined, no change in status, stable for surgery.  I have reviewed the patient's chart and labs.  Questions were answered to the patient's satisfaction.     Reza Crymes Lenna Sciara

## 2016-10-28 NOTE — Op Note (Signed)
OPERATIVE NOTE-LAPAROSCOPIC CHOLECYSTECTOMY  Preoperative diagnosis:  Symptomatic cholelithiasis  Postoperative diagnosis:  Same plus choledocholithiasis  Procedure: Laparoscopic cholecystectomy with cholangiogram.  Surgeon: Jackolyn Confer, M.D.  Asst.:  Verita Lamb, M.D.  Indication for assistant:  The patient had aberrant anatomy and an intrahepatic gallbladder. Exposure was difficult.  Anesthesia: General  EBL:  200 cc  Indication:   This is a 66 year old female whose had intermittent symptomatic cholelithiasis depending on what she eats. She has multiple gallstones on her ultrasound and a normal common bile duct diameter.  Preoperative liver function tests are normal. She now presents for elective cholecystectomy.  Findings: The fundus and body of the gallbladder were intrahepatic. 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 There were multiple filling defects in the distal common bile duct concerning for common bile duct stones on cholangiogram.  There was a dense adhesion between the small intestine and anterior abdominal wall 5 cm inferior to the umbilicus.  Technique: She was brought to the operating room, placed supine on the operating table, and a general anesthetic was administered. The abdominal wall was then sterilely prepped and draped.  A timeout was performed.    Local anesthetic (Marcaine) was infiltrated in the subumbilical region. A small subumbilical incision was made through the skin, subcutaneous tissue, fascia, and peritoneum entering the peritoneal cavity under direct vision. A pursestring suture of 0 Vicryl was placed around the edges of the fascia. A Hassan trocar was introduced into the peritoneal cavity and a pneumoperitoneum was created by insufflation of carbon dioxide gas. The laparoscope was introduced into the trocar and no underlying bleeding or organ injury was noted. She was then placed in the reverse  Trendelenburg position with the right side tilted slightly up.  Three 5 mm trocars were then placed into the abdominal cavity under laparoscopic vision. One in the epigastric area, and 2 in the right upper quadrant area. The gallbladder was visualized and chronic inflammatory changes were noted. The body and the fundus of the gallbladder were intrahepatic.  The fundus was grasped and retracted toward the right shoulder.  The infundibulum was mobilized with dissection close to the gallbladder and retracted laterally. A tubular structure was noted coming off the ileum and ileum relatively laterally. I isolated this and clipped it at its intersection with the gallbladder. A small incision was made and there was some bleeding. A larger incision and there is some question whether this was the cystic duct. I put a cholangiocatheter and and did a quick injection and there was extravasation of contrast. I subsequently removed the Cholangiocath and clipped it 3 times distally and divided this structure. 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 A window was then created around it around the cystic duct.   A clip was placed at the neck of the gallbladder. A small incision was made in the cystic duct. A cholangiocatheter was introduced through the anterior abdominal wall and placed in the cystic duct. A intraoperative cholangiogram was then performed.  Under real-time fluoroscopy, dilute contrast was injected into the cystic duct.  The common hepatic duct, the right and left hepatic ducts, and the common duct were all visualized. Contrast drained into the duodenum and multiple distal filling defects were noted concerning for common bile duct stones. This was confirmed by the radiologist.  The cholangiocatheter was removed, the cystic duct was clipped  3 times on the biliary side, and then the cystic duct was divided  sharply. No bile leak was noted from the cystic duct stump.  The posterior branch of the cystic artery was then identified, isolate, clipped and divided. Following this the gallbladder was dissected free from the liver using electrocautery. The gallbladder was then placed in a retrieval bag and removed from the abdominal cavity through the subumbilical incision.  The gallbladder fossa was inspected, irrigated, and bleeding was controlled with electrocautery. Inspection showed that hemostasis was adequate and there was no evidence of bile leak. Surgicel was placed in the gallbladder fossa. The irrigation fluid was evacuated as much as possible.  The subumbilical trocar was removed and the fascial defect was closed by tightening and tying down the pursestring suture under laparoscopic vision.  The remaining trocars were removed and the pneumoperitoneum was released. The skin incisions were closed with 4-0 Monocryl subcuticular stitches. Steri-Strips and sterile dressings were applied.  The procedure was well-tolerated without any apparent complications. She was taken to the recovery room in satisfactory condition. She will be admitted and a GI consultation for ERCP will be obtained.

## 2016-10-28 NOTE — Anesthesia Procedure Notes (Signed)
Procedure Name: Intubation Date/Time: 10/28/2016 7:30 AM Performed by: Maxwell Caul Pre-anesthesia Checklist: Patient identified, Emergency Drugs available, Suction available and Patient being monitored Patient Re-evaluated:Patient Re-evaluated prior to inductionOxygen Delivery Method: Circle system utilized Preoxygenation: Pre-oxygenation with 100% oxygen Intubation Type: IV induction Ventilation: Mask ventilation without difficulty Laryngoscope Size: Mac and 4 Grade View: Grade I Tube type: Oral Tube size: 7.5 mm Number of attempts: 1 Airway Equipment and Method: Stylet Placement Confirmation: ETT inserted through vocal cords under direct vision,  positive ETCO2 and breath sounds checked- equal and bilateral Secured at: 21 cm Tube secured with: Tape Dental Injury: Teeth and Oropharynx as per pre-operative assessment

## 2016-10-28 NOTE — Anesthesia Preprocedure Evaluation (Addendum)
Anesthesia Evaluation  Patient identified by MRN, date of birth, ID band Patient awake    Reviewed: Allergy & Precautions, NPO status , Patient's Chart, lab work & pertinent test results  Airway Mallampati: I  TM Distance: >3 FB Neck ROM: Full    Dental  (+) Teeth Intact, Missing, Loose, Implants,    Pulmonary asthma , former smoker,    breath sounds clear to auscultation       Cardiovascular  Rhythm:Regular Rate:Normal     Neuro/Psych negative neurological ROS     GI/Hepatic Neg liver ROS, GERD  ,  Endo/Other  Morbid obesity  Renal/GU negative Renal ROS     Musculoskeletal   Abdominal (+) + obese,   Peds  Hematology   Anesthesia Other Findings   Reproductive/Obstetrics                            Anesthesia Physical Anesthesia Plan  ASA: II  Anesthesia Plan: General   Post-op Pain Management:    Induction: Intravenous  Airway Management Planned: Oral ETT  Additional Equipment:   Intra-op Plan:   Post-operative Plan: Extubation in OR  Informed Consent: I have reviewed the patients History and Physical, chart, labs and discussed the procedure including the risks, benefits and alternatives for the proposed anesthesia with the patient or authorized representative who has indicated his/her understanding and acceptance.     Plan Discussed with:   Anesthesia Plan Comments:         Anesthesia Quick Evaluation

## 2016-10-28 NOTE — Consult Note (Signed)
Consultation  Referring Provider: Dr Zella Richer Primary Care Physician:  Rachell Cipro, MD Primary Gastroenterologist:  Former- Dr Deatra Ina  Reason for Consultation:  Choledocholithiasis  HPI: Sarah Lee is a 66 y.o. female who was admitted this morning after undergoing laparoscopic cholecystectomy per Dr. Zella Richer Patient had been having intermittent right upper quadrant pain, and ultrasound showed multiple gallstones without gallbladder wall thickening or pericholecystic fluid and CBD was 5.5 mm. This was in July 2017. At the time of surgery today, IOC was done and showed multiple filling defects in the distal common bile duct concerning for common bile duct stones. She was also noted to have a chronically inflamed gallbladder. Labs were done preoperatively on 02 /08/2017 with entirely normal liver function studies, and normal CBC. Patient is generally healthy with history of GERD, asthma, and family history of colon cancer. She had undergone screening colonoscopy in 2013 per Dr. Deatra Ina with finding of one 2 mm polyp which was benign/ no  adenomatous change was also noted to have mild left-sided diverticulosis.  Pt is still semi-sedated this afternoon.   Past Medical History:  Diagnosis Date  . Allergy   . Anemia   . Asthma    no medications  . Blood transfusion   . Cataract    slight  . Complication of anesthesia    used sodium pentothal and very hard to wake up and arm swelled up from medication  . Constipation   . Gallstones   . GERD (gastroesophageal reflux disease)   . Heart murmur   . Seasonal allergies     Past Surgical History:  Procedure Laterality Date  . ABDOMINAL HYSTERECTOMY  2004   fibroids--- TAH/BSO  . BREAST SURGERY     right breast biopsy-benign  . CESAREAN SECTION    . UTERINE FIBROID SURGERY      Prior to Admission medications   Medication Sig Start Date End Date Taking? Authorizing Provider  albuterol (PROVENTIL HFA;VENTOLIN HFA) 108 (90  Base) MCG/ACT inhaler Inhale 1-2 puffs into the lungs every 4 (four) hours as needed for wheezing or shortness of breath.    Historical Provider, MD  montelukast (SINGULAIR) 10 MG tablet Take 10 mg by mouth daily as needed.    Historical Provider, MD    Current Facility-Administered Medications  Medication Dose Route Frequency Provider Last Rate Last Dose  . albuterol (PROVENTIL) (2.5 MG/3ML) 0.083% nebulizer solution 3 mL  3 mL Inhalation Q4H PRN Jackolyn Confer, MD      . ciprofloxacin (CIPRO) IVPB 400 mg  400 mg Intravenous Q12H Jackolyn Confer, MD      . dextrose 5 % and 0.9 % NaCl with KCl 20 mEq/L infusion   Intravenous Continuous Jackolyn Confer, MD      . HYDROmorphone (DILAUDID) 1 MG/ML injection           . HYDROmorphone (DILAUDID) 1 MG/ML injection           . morphine 4 MG/ML injection 2-6 mg  2-6 mg Intravenous Q2H PRN Jackolyn Confer, MD      . ondansetron (ZOFRAN-ODT) disintegrating tablet 4 mg  4 mg Oral Q6H PRN Jackolyn Confer, MD       Or  . ondansetron [facility]) injection 4 mg  4 mg Intravenous Q4H PRN Jackolyn Confer, MD      . oxyCODONE (Oxy IR/ROXICODONE) immediate release tablet 5-10 mg  5-10 mg Oral Q4H PRN Jackolyn Confer, MD        Allergies as of 09/28/2016 - Review Complete  12/21/2015  Allergen Reaction Noted  . Iron Other (See Comments) 05/20/2011  . Penicillins Rash 05/20/2011  . Tetracyclines & related Nausea And Vomiting and Anxiety 05/20/2011    Family History  Problem Relation Age of Onset  . Cancer Father     prostate  . Anemia Mother   . Stomach cancer Cousin     maternal side   . Stomach cancer Maternal Uncle   . Colon cancer Neg Hx   . Esophageal cancer Neg Hx   . Rectal cancer Neg Hx     Social History   Social History  . Marital status: Married    Spouse name: N/A  . Number of children: N/A  . Years of education: N/A   Occupational History  .      unemployed   Social History Main Topics  . Smoking status: Former Smoker     Packs/day: 1.50    Years: 25.00    Types: Cigarettes    Quit date: 03/02/1988  . Smokeless tobacco: Never Used  . Alcohol use Yes     Comment: wine occassionally  . Drug use: No  . Sexual activity: Not Currently   Other Topics Concern  . Not on file   Social History Narrative   Exercise--  no    Review of Systems: Pertinent positive and negative review of systems were noted in the above HPI section.  All other review of systems was otherwise negative.Marland Kitchen  Physical Exam: Vital signs in last 24 hours: Temp:  [98.1 F (36.7 C)-98.6 F (37 C)] 98.3 F (36.8 C) (02/15 1038) Pulse Rate:  [68-105] 90 (02/15 1015) Resp:  [14-25] 16 (02/15 1038) BP: (118-138)/(60-72) 128/67 (02/15 1038) SpO2:  [98 %-100 %] 100 % (02/15 1038) Weight:  [230 lb (104.3 kg)] 230 lb (104.3 kg) (02/15 0546)   General:  Sleepy ,  Well-developed, well-nourished AA female , pleasant and cooperative in NAD Head:  Normocephalic and atraumatic. Eyes:  Sclera clear, no icterus.   Conjunctiva pink. Ears:  Normal auditory acuity. Nose:  No deformity, discharge,  or lesions. Mouth:  No deformity or lesions.   Neck:  Supple; no masses or thyromegaly. Lungs:  Clear throughout to auscultation.   No wheezes, crackles, or rhonchi. Heart:  Regular rate and rhythm; no murmurs, clicks, rubs,  or gallops. Abdomen:  Soft,tender, BS quiet,,nonpalp mass or hsm.   Rectal:  Deferred  Msk:  Symmetrical without gross deformities. . Pulses:  Normal pulses noted. Extremities:  Without clubbing or edema. Neurologic:  Alert and  oriented x4;  grossly normal neurologically. Skin:  Intact without significant lesions or rashes.. Psych:  Alert and cooperative. Normal mood and affect.  Intake/Output from previous day: No intake/output data recorded. Intake/Output this shift: Total I/O In: 900 [I.V.:900] Out: 25 [Blood:25]  Lab Results: No results for input(s): WBC, HGB, HCT, PLT in the last 72 hours. BMET No results for  input(s): NA, K, CL, CO2, GLUCOSE, BUN, CREATININE, CALCIUM in the last 72 hours. LFT No results for input(s): PROT, ALBUMIN, AST, ALT, ALKPHOS, BILITOT, BILIDIR, IBILI in the last 72 hours. PT/INR No results for input(s): LABPROT, INR in the last 72 hours. Hepatitis Panel No results for input(s): HEPBSAG, HCVAB, HEPAIGM, HEPBIGM in the last 72 hours.   IMPRESSION:  #31 66 year old female status post laparoscopic cholecystectomy earlier today with finding of multiple probable distal common bile duct stones on IOC. Normal LFTs preoperatively #2 history of GERD #3 asthma #4 Positive family history of colon cancer in  patient's father-patient up-to-date with colonoscopy last in 2013 will need follow-up sometime this year  PLAN: Will schedule for ERCP with Dr. Loletha Carrow  with stone extraction tomorrow Friday, 10/29/2016 at 10 am. Okay to eat later today and nothing by mouth past midnight. On Cipro-continue  Procedure discussed in detail with the patient including risks and benefits and she is agreeable to proceed.   Maguire Sime  10/28/2016, 11:24 AM

## 2016-10-28 NOTE — Progress Notes (Signed)
Finding of CBD stones discussed with her.  ERCP tomorrow.

## 2016-10-28 NOTE — H&P (Signed)
Sarah Lee  DOB: [birth year] Married / Language: English / Race: Black or African American Female  History of Present Illness  The patient is a 66 year old female.   Note:She presents today for elective laparoscopic cholecystectomy for her symptomatic cholelithiasis. She continues to have intermittent right upper quadrant pain depending on what she eats. Her trigger foods are mostly dairy products. She also has some spasmodic type mid back pain when she bends over. I explained to her I did not think this was related to her gallbladder. She is interested in having cholecystectomy now.  Prior to Admission medications   Medication Sig Start Date End Date Taking? Authorizing Provider  albuterol (PROVENTIL HFA;VENTOLIN HFA) 108 (90 Base) MCG/ACT inhaler Inhale 1-2 puffs into the lungs every 4 (four) hours as needed for wheezing or shortness of breath.    Historical Provider, MD  montelukast (SINGULAIR) 10 MG tablet Take 10 mg by mouth daily as needed.    Historical Provider, MD     Physical Exam   The physical exam findings are as follows: Note:GENERAL APPEARANCE: Obese female in NAD. Pleasant and cooperative.  EARS, NOSE, MOUTH THROAT: [place]/AT external ears: no lesions or deformities external nose: no lesions or deformities hearing: grossly normal lips: moist, no deformities  EYES external: conjunctiva, lids, sclerae normal pupils: equal, round glasses: yes  CV ascultation: RRR, no murmur extremity edema: no carotid bruit: no  RESP auscultation: breath sounds equal and clear respiratory effort: normal  GASTROINTESTINAL abdomen: Soft, non-tender, non-distended, no masses liver and spleen: not enlarged. hernia: none present scar: subumbilical  NEUROPSYCHIATRIC alertness and orientation: normal speech: normal mood/affect/behavior: normal judgement and insight: normal    Assessment & Plan Odis Hollingshead MD; 09/23/2016 2:13  PM)  SYMPTOMATIC CHOLELITHIASIS (K80.20) Impression: Her symptoms continue depending on what she eats.  I explained to her that her back pain was musculoskeletal in my opinion.  Plan: Laparoscopic cholecystectomy with cholangiogram. I have explained the procedure, risks, and aftercare of cholecystectomy. Risks include but are not limited to bleeding, infection, wound problems, anesthesia, diarrhea, bile leak, injury to common bile duct/liver/intestine. She seems to understand and agrees to proceed.  Sarah Lee, M.D.

## 2016-10-29 ENCOUNTER — Observation Stay (HOSPITAL_COMMUNITY): Payer: BLUE CROSS/BLUE SHIELD | Admitting: Anesthesiology

## 2016-10-29 ENCOUNTER — Encounter (HOSPITAL_COMMUNITY): Payer: Self-pay | Admitting: Anesthesiology

## 2016-10-29 ENCOUNTER — Encounter (HOSPITAL_COMMUNITY)
Admission: AD | Disposition: A | Discharge status: Home / Self Care | Payer: Self-pay | Source: Ambulatory Visit | Attending: General Surgery

## 2016-10-29 ENCOUNTER — Observation Stay (HOSPITAL_COMMUNITY): Payer: BLUE CROSS/BLUE SHIELD

## 2016-10-29 DIAGNOSIS — K8050 Calculus of bile duct without cholangitis or cholecystitis without obstruction: Secondary | ICD-10-CM

## 2016-10-29 DIAGNOSIS — R748 Abnormal levels of other serum enzymes: Secondary | ICD-10-CM | POA: Diagnosis not present

## 2016-10-29 DIAGNOSIS — R932 Abnormal findings on diagnostic imaging of liver and biliary tract: Secondary | ICD-10-CM | POA: Diagnosis not present

## 2016-10-29 HISTORY — PX: ERCP: SHX5425

## 2016-10-29 LAB — COMPREHENSIVE METABOLIC PANEL
ALT: 243 U/L — ABNORMAL HIGH (ref 14–54)
AST: 171 U/L — ABNORMAL HIGH (ref 15–41)
Albumin: 3.5 g/dL (ref 3.5–5.0)
Alkaline Phosphatase: 72 U/L (ref 38–126)
Anion gap: 5 (ref 5–15)
BUN: 7 mg/dL (ref 6–20)
CO2: 27 mmol/L (ref 22–32)
Calcium: 9.2 mg/dL (ref 8.9–10.3)
Chloride: 110 mmol/L (ref 101–111)
Creatinine, Ser: 0.67 mg/dL (ref 0.44–1.00)
GFR calc Af Amer: >60 mL/min (ref >60)
GFR calc non Af Amer: >60 mL/min (ref >60)
Glucose, Bld: 117 mg/dL — ABNORMAL HIGH (ref 65–99)
Potassium: 4 mmol/L (ref 3.5–5.1)
Sodium: 142 mmol/L (ref 135–145)
Total Bilirubin: 1 mg/dL (ref 0.3–1.2)
Total Protein: 6.3 g/dL — ABNORMAL LOW (ref 6.5–8.1)

## 2016-10-29 SURGERY — ERCP, WITH INTERVENTION IF INDICATED
Anesthesia: General

## 2016-10-29 MED ORDER — PROPOFOL 10 MG/ML IV BOLUS
INTRAVENOUS | Status: AC
  Filled 2016-10-29: qty 20

## 2016-10-29 MED ORDER — DEXAMETHASONE SODIUM PHOSPHATE 10 MG/ML IJ SOLN
INTRAMUSCULAR | Status: DC | PRN
  Administered 2016-10-29: 10 mg via INTRAVENOUS

## 2016-10-29 MED ORDER — ROCURONIUM BROMIDE 10 MG/ML (PF) SYRINGE
PREFILLED_SYRINGE | INTRAVENOUS | Status: DC | PRN
  Administered 2016-10-29: 30 mg via INTRAVENOUS

## 2016-10-29 MED ORDER — LACTATED RINGERS IV SOLN
INTRAVENOUS | Status: DC | PRN
  Administered 2016-10-29: 09:00:00 via INTRAVENOUS

## 2016-10-29 MED ORDER — CIPROFLOXACIN IN D5W 400 MG/200ML IV SOLN
400.0000 mg | Freq: Once | INTRAVENOUS | Status: AC
  Administered 2016-10-29: 400 mg via INTRAVENOUS

## 2016-10-29 MED ORDER — ONDANSETRON HCL 4 MG/2ML IJ SOLN: 4.0000 mg | Freq: Once | INTRAMUSCULAR | Status: DC | PRN

## 2016-10-29 MED ORDER — FENTANYL CITRATE (PF) 100 MCG/2ML IJ SOLN: 25.0000 ug | INTRAMUSCULAR | Status: DC | PRN

## 2016-10-29 MED ORDER — SUGAMMADEX SODIUM 200 MG/2ML IV SOLN
INTRAVENOUS | Status: DC | PRN
  Administered 2016-10-29: 200 mg via INTRAVENOUS

## 2016-10-29 MED ORDER — INDOMETHACIN 50 MG RE SUPP
100.0000 mg | Freq: Once | RECTAL | Status: DC
  Filled 2016-10-29: qty 2

## 2016-10-29 MED ORDER — LACTATED RINGERS IV SOLN: INTRAVENOUS | Status: DC

## 2016-10-29 MED ORDER — PROPOFOL 10 MG/ML IV BOLUS
INTRAVENOUS | Status: DC | PRN
  Administered 2016-10-29: 150 mg via INTRAVENOUS

## 2016-10-29 MED ORDER — CIPROFLOXACIN IN D5W 400 MG/200ML IV SOLN
INTRAVENOUS | Status: AC
  Filled 2016-10-29: qty 200

## 2016-10-29 MED ORDER — ONDANSETRON HCL 4 MG/2ML IJ SOLN
INTRAMUSCULAR | Status: AC
  Filled 2016-10-29: qty 2

## 2016-10-29 MED ORDER — PROCHLORPERAZINE EDISYLATE 5 MG/ML IJ SOLN
5.0000 mg | INTRAMUSCULAR | Status: DC | PRN
  Administered 2016-10-29: 5 mg via INTRAVENOUS
  Administered 2016-10-30: 10 mg via INTRAVENOUS
  Filled 2016-10-29 (×2): qty 2

## 2016-10-29 MED ORDER — LIDOCAINE 2% (20 MG/ML) 5 ML SYRINGE
INTRAMUSCULAR | Status: AC
  Filled 2016-10-29: qty 5

## 2016-10-29 MED ORDER — GLUCAGON HCL RDNA (DIAGNOSTIC) 1 MG IJ SOLR
INTRAMUSCULAR | Status: DC | PRN
  Administered 2016-10-29 (×2): .5 mg via INTRAVENOUS

## 2016-10-29 MED ORDER — PHENYLEPHRINE 40 MCG/ML (10ML) SYRINGE FOR IV PUSH (FOR BLOOD PRESSURE SUPPORT)
PREFILLED_SYRINGE | INTRAVENOUS | Status: DC | PRN
  Administered 2016-10-29: 40 ug via INTRAVENOUS
  Administered 2016-10-29: 80 ug via INTRAVENOUS
  Administered 2016-10-29: 40 ug via INTRAVENOUS

## 2016-10-29 MED ORDER — METOCLOPRAMIDE HCL 5 MG/ML IJ SOLN
5.0000 mg | Freq: Four times a day (QID) | INTRAMUSCULAR | Status: DC | PRN
  Administered 2016-11-03: 10 mg via INTRAVENOUS
  Filled 2016-10-29: qty 2

## 2016-10-29 MED ORDER — IOPAMIDOL (ISOVUE-300) INJECTION 61%
INTRAVENOUS | Status: DC | PRN
  Administered 2016-10-29: 40 mL

## 2016-10-29 MED ORDER — ONDANSETRON HCL 4 MG/2ML IJ SOLN
4.0000 mg | INTRAMUSCULAR | Status: DC | PRN
  Administered 2016-10-30 – 2016-11-02 (×7): 4 mg via INTRAVENOUS
  Filled 2016-10-29 (×7): qty 2

## 2016-10-29 MED ORDER — SUGAMMADEX SODIUM 500 MG/5ML IV SOLN
INTRAVENOUS | Status: AC
  Filled 2016-10-29: qty 5

## 2016-10-29 MED ORDER — DEXAMETHASONE SODIUM PHOSPHATE 10 MG/ML IJ SOLN
INTRAMUSCULAR | Status: AC
  Filled 2016-10-29: qty 1

## 2016-10-29 MED ORDER — GLUCAGON HCL RDNA (DIAGNOSTIC) 1 MG IJ SOLR
INTRAMUSCULAR | Status: AC
  Filled 2016-10-29: qty 1

## 2016-10-29 MED ORDER — LIDOCAINE 2% (20 MG/ML) 5 ML SYRINGE
INTRAMUSCULAR | Status: DC | PRN
  Administered 2016-10-29: 50 mg via INTRAVENOUS

## 2016-10-29 MED ORDER — FENTANYL CITRATE (PF) 100 MCG/2ML IJ SOLN
INTRAMUSCULAR | Status: AC
  Filled 2016-10-29: qty 2

## 2016-10-29 MED ORDER — ONDANSETRON 4 MG PO TBDP
4.0000 mg | ORAL_TABLET | Freq: Four times a day (QID) | ORAL | Status: DC | PRN
  Administered 2016-10-31 – 2016-11-04 (×2): 4 mg via ORAL
  Filled 2016-10-29 (×2): qty 1

## 2016-10-29 MED ORDER — MIDAZOLAM HCL 2 MG/2ML IJ SOLN
INTRAMUSCULAR | Status: AC
  Filled 2016-10-29: qty 2

## 2016-10-29 MED ORDER — FENTANYL CITRATE (PF) 100 MCG/2ML IJ SOLN
INTRAMUSCULAR | Status: DC | PRN
  Administered 2016-10-29 (×2): 50 ug via INTRAVENOUS
  Administered 2016-10-29: 100 ug via INTRAVENOUS

## 2016-10-29 MED ORDER — ROCURONIUM BROMIDE 50 MG/5ML IV SOSY
PREFILLED_SYRINGE | INTRAVENOUS | Status: AC
  Filled 2016-10-29: qty 5

## 2016-10-29 MED ORDER — PHENYLEPHRINE 40 MCG/ML (10ML) SYRINGE FOR IV PUSH (FOR BLOOD PRESSURE SUPPORT)
PREFILLED_SYRINGE | INTRAVENOUS | Status: AC
  Filled 2016-10-29: qty 10

## 2016-10-29 NOTE — Progress Notes (Signed)
Some epigastric pain and tenderness this afternoon post ERCP.  Will keep overnight for observation.

## 2016-10-29 NOTE — Anesthesia Preprocedure Evaluation (Addendum)
Anesthesia Evaluation  Patient identified by MRN, date of birth, ID band Patient awake    Reviewed: Allergy & Precautions, NPO status , Patient's Chart, lab work & pertinent test results  Airway Mallampati: II  TM Distance: >3 FB Neck ROM: Full    Dental  (+) Teeth Intact, Dental Advisory Given, Missing, Implants,    Pulmonary asthma , former smoker,    Pulmonary exam normal breath sounds clear to auscultation       Cardiovascular negative cardio ROS Normal cardiovascular exam Rhythm:Regular Rate:Normal     Neuro/Psych negative neurological ROS  negative psych ROS   GI/Hepatic GERD  ,CBD stones   Endo/Other  Obesity   Renal/GU negative Renal ROS     Musculoskeletal negative musculoskeletal ROS (+)   Abdominal   Peds  Hematology  (+) Blood dyscrasia, anemia ,   Anesthesia Other Findings Day of surgery medications reviewed with the patient.  Reproductive/Obstetrics                            Anesthesia Physical Anesthesia Plan  ASA: II  Anesthesia Plan: General   Post-op Pain Management:    Induction: Intravenous  Airway Management Planned: Oral ETT  Additional Equipment:   Intra-op Plan:   Post-operative Plan: Extubation in OR  Informed Consent: I have reviewed the patients History and Physical, chart, labs and discussed the procedure including the risks, benefits and alternatives for the proposed anesthesia with the patient or authorized representative who has indicated his/her understanding and acceptance.   Dental advisory given  Plan Discussed with: CRNA  Anesthesia Plan Comments: (Risks/benefits of general anesthesia discussed with patient including risk of damage to teeth, lips, gum, and tongue, nausea/vomiting, allergic reactions to medications, and the possibility of heart attack, stroke and death.  All patient questions answered.  Patient wishes to proceed.)        Anesthesia Quick Evaluation

## 2016-10-29 NOTE — Anesthesia Postprocedure Evaluation (Signed)
Anesthesia Post Note  Patient: SRAVYA GRISSOM  Procedure(s) Performed: Procedure(s) (LRB): ENDOSCOPIC RETROGRADE CHOLANGIOPANCREATOGRAPHY (ERCP) (N/A)  Patient location during evaluation: Endoscopy Anesthesia Type: General Level of consciousness: awake and alert Pain management: pain level controlled Vital Signs Assessment: post-procedure vital signs reviewed and stable Respiratory status: spontaneous breathing, nonlabored ventilation, respiratory function stable and patient connected to nasal cannula oxygen Cardiovascular status: blood pressure returned to baseline and stable Postop Assessment: no signs of nausea or vomiting Anesthetic complications: no       Last Vitals:  Vitals:   10/29/16 1230 10/29/16 1245  BP: (!) 126/56 109/62  Pulse: 75 73  Resp: 18 18  Temp:  36.6 C    Last Pain:  Vitals:   10/29/16 1145  TempSrc: Oral  PainSc:                  Catalina Gravel

## 2016-10-29 NOTE — Progress Notes (Signed)
Assessment Principal Problem:   Cholelithiasis with choledocholithiasis s/p laparoscopic cholecystectomy with IOC 10/28/16-stable overnight   Plan:  ERCP today.   LOS: 0 days     1 Day Post-Op  Subjective: Sore.  Objective: Vital signs in last 24 hours: Temp:  [97.6 F (36.4 C)-98.6 F (37 C)] 98.3 F (36.8 C) (02/16 0500) Pulse Rate:  [88-105] 96 (02/16 0500) Resp:  [14-25] 16 (02/16 0500) BP: (100-139)/(53-74) 125/60 (02/16 0500) SpO2:  [94 %-100 %] 98 % (02/16 0500) Last BM Date: 10/27/16  Intake/Output from previous day: 02/15 0701 - 02/16 0700 In: 2500 [I.V.:2500] Out: 1925 [Urine:1900; Blood:25] Intake/Output this shift: No intake/output data recorded.  PE: General- In NAD Abdomen-soft, dressings dry  Lab Results:  No results for input(s): WBC, HGB, HCT, PLT in the last 72 hours. BMET  Recent Labs  10/29/16 0557  NA 142  K 4.0  CL 110  CO2 27  GLUCOSE 117*  BUN 7  CREATININE 0.67  CALCIUM 9.2   PT/INR No results for input(s): LABPROT, INR in the last 72 hours. Comprehensive Metabolic Panel:    Component Value Date/Time   NA 142 10/29/2016 0557   NA 141 10/25/2016 1058   K 4.0 10/29/2016 0557   K 4.1 10/25/2016 1058   CL 110 10/29/2016 0557   CL 108 10/25/2016 1058   CO2 27 10/29/2016 0557   CO2 28 10/25/2016 1058   BUN 7 10/29/2016 0557   BUN 10 10/25/2016 1058   CREATININE 0.67 10/29/2016 0557   CREATININE 0.68 10/25/2016 1058   GLUCOSE 117 (H) 10/29/2016 0557   GLUCOSE 85 10/25/2016 1058   CALCIUM 9.2 10/29/2016 0557   CALCIUM 9.2 10/25/2016 1058   AST 171 (H) 10/29/2016 0557   AST 27 10/25/2016 1058   ALT 243 (H) 10/29/2016 0557   ALT 25 10/25/2016 1058   ALKPHOS 72 10/29/2016 0557   ALKPHOS 64 10/25/2016 1058   BILITOT 1.0 10/29/2016 0557   BILITOT 0.8 10/25/2016 1058   PROT 6.3 (L) 10/29/2016 0557   PROT 6.7 10/25/2016 1058   ALBUMIN 3.5 10/29/2016 0557   ALBUMIN 4.1 10/25/2016 1058     Studies/Results: Dg  Cholangiogram Operative  Result Date: 10/28/2016 CLINICAL DATA:  Gallstone EXAM: INTRAOPERATIVE CHOLANGIOGRAM TECHNIQUE: Cholangiographic images from the C-arm fluoroscopic device were submitted for interpretation post-operatively. Please see the procedural report for the amount of contrast and the fluoroscopy time utilized. COMPARISON:  None. FINDINGS: Contrast fills the common bile duct. There is trace amount of contrast in the duodenum. Several filling defects are present at the lower common bile duct. IMPRESSION: Findings are worrisome for distal common bile duct stones and some degree of obstruction of the distal common bile duct. Electronically Signed   By: Marybelle Killings M.D.   On: 10/28/2016 08:55    Anti-infectives: Anti-infectives    Start     Dose/Rate Route Frequency Ordered Stop   10/28/16 1130  ciprofloxacin (CIPRO) IVPB 400 mg     400 mg 200 mL/hr over 60 Minutes Intravenous Every 12 hours 10/28/16 1046 10/28/16 1321   10/28/16 0531  ciprofloxacin (CIPRO) IVPB 400 mg     400 mg 200 mL/hr over 60 Minutes Intravenous On call to O.R. 10/28/16 0531 10/28/16 0731       Kimiah Hibner J 10/29/2016

## 2016-10-29 NOTE — Anesthesia Procedure Notes (Signed)

## 2016-10-29 NOTE — Progress Notes (Signed)
Patient ID: Sarah Lee, female   DOB: 1951/03/15, 66 y.o.   MRN: [id number]    Progress Note   Subjective  Feels fine, a little sore, up walking in room. Questions about ERCP answered.  LFT's  Have bumped- AST 171,ALT 243,Tbili 1.0   Objective   Vital signs in last 24 hours: Temp:  [97.6 F (36.4 C)-98.6 F (37 C)] 98.3 F (36.8 C) (02/16 0500) Pulse Rate:  [88-105] 96 (02/16 0500) Resp:  [14-25] 16 (02/16 0500) BP: (100-139)/(53-74) 125/60 (02/16 0500) SpO2:  [94 %-100 %] 98 % (02/16 0500) Last BM Date: 10/27/16 General:    AA  female in NAD Heart:  Regular rate and rhythm; no murmurs Lungs: Respirations even and unlabored, lungs CTA bilaterally Abdomen:  Soft, mid abdomen tender and nondistended. Normal bowel sounds. Extremities:  Without edema. Neurologic:  Alert and oriented,  grossly normal neurologically. Psych:  Cooperative. Normal mood and affect.  Intake/Output from previous day: 02/15 0701 - 02/16 0700 In: 2500 [I.V.:2500] Out: 1925 [Urine:1900; Blood:25] Intake/Output this shift: No intake/output data recorded.  Lab Results: No results for input(s): WBC, HGB, HCT, PLT in the last 72 hours. BMET  Recent Labs  10/29/16 0557  NA 142  K 4.0  CL 110  CO2 27  GLUCOSE 117*  BUN 7  CREATININE 0.67  CALCIUM 9.2   LFT  Recent Labs  10/29/16 0557  PROT 6.3*  ALBUMIN 3.5  AST 171*  ALT 243*  ALKPHOS 72  BILITOT 1.0   PT/INR No results for input(s): LABPROT, INR in the last 72 hours.  Studies/Results: Dg Cholangiogram Operative  Result Date: 10/28/2016 CLINICAL DATA:  Gallstone EXAM: INTRAOPERATIVE CHOLANGIOGRAM TECHNIQUE: Cholangiographic images from the C-arm fluoroscopic device were submitted for interpretation post-operatively. Please see the procedural report for the amount of contrast and the fluoroscopy time utilized. COMPARISON:  None. FINDINGS: Contrast fills the common bile duct. There is trace amount of contrast in the duodenum.  Several filling defects are present at the lower common bile duct. IMPRESSION: Findings are worrisome for distal common bile duct stones and some degree of obstruction of the distal common bile duct. Electronically Signed   By: Marybelle Killings M.D.   On: 10/28/2016 08:55       Assessment / Plan:     #1 66 yo female s/p lap chole yesterday with IOC showing probable several CBD stones, no ductal dilation LFT's now elevated   Plan : Scheduled for ERCP with stone extraction this am On Cipro  Principal Problem:   Cholelithiasis with choledocholithiasis     LOS: 0 days   Sarah Lee  10/29/2016, 8:41 AM

## 2016-10-29 NOTE — Op Note (Signed)
[facility] Patient Name: Sarah Lee Procedure Date: 10/29/2016 MRN: [id number] Attending MD: Starr Lake. Myrtie Neither , MD Date of Birth: [birth year] CSN: [id number] Age: 66 Admit Type: Inpatient Procedure:                ERCP Indications:              Filling defect on intraoperative cholangiogram,                            Bile duct stone(s), Elevated liver enzymes Providers:                Sherilyn Cooter L. Myrtie Neither, MD, Omelia Blackwater RN, RN, Arlee Muslim Tech., Technician, Paris Lore                            CRNA, CRNA Referring MD:              Medicines:                General Anesthesia, Cipro 400 mg IV, Indomethacin                            100 mg PR Complications:            No immediate complications. Estimated Blood Loss:     Estimated blood loss: none. Procedure:                Pre-Anesthesia Assessment:                           - Prior to the procedure, a History and Physical                            was performed, and patient medications and                            allergies were reviewed. The patient's tolerance of                            previous anesthesia was also reviewed. The risks                            and benefits of the procedure and the sedation                            options and risks were discussed with the patient.                            All questions were answered, and informed consent                            was obtained. Prior Anticoagulants: The patient has  taken no previous anticoagulant or antiplatelet                            agents. ASA Grade Assessment: II - A patient with                            mild systemic disease. After reviewing the risks                            and benefits, the patient was deemed in                            satisfactory condition to undergo the procedure.                           After obtaining informed consent, the scope  was                            passed under direct vision. Throughout the                            procedure, the patient's blood pressure, pulse, and                            oxygen saturations were monitored continuously. The                            [id number] ([id number]) scope was introduced through                            the mouth, and used to inject contrast into and                            used to inject contrast into the bile duct. The                            ERCP was performed with difficulty due to                            challenging cannulation. Successful completion of                            the procedure was aided by PD stent placement. The                            patient tolerated the procedure well. Scope In: Scope Out: Findings:      A scout film of the abdomen was obtained. Surgical clips, consistent       with a previous cholecystectomy, were seen in the area of the right       upper quadrant of the abdomen. The esophagus was successfully intubated       under direct vision. The scope was advanced to a normal major papilla in       the descending duodenum without  detailed examination of the pharynx,       larynx and associated structures, and upper GI tract. The upper GI tract       was grossly normal. Biliary cannulation was difficult, with the wire       passing into the PD multiple times. Contrast was not injected into the       pancreatic duct. 0.035 inch x 260 cm straight Hydra Jagwire was passed       into the ventral pancreatic duct. The ventral pancreatic duct was then       deeply cannulated with the traction (standard) sphincterotome. One 4 Fr       by 3 cm plastic stent with two external flaps and two internal flaps was       placed into the ventral pancreatic duct. Clear fluid flowed through the       stent. The stent was in good position. Using the original sphincterotome       and wire, the 0.035 inch x 260 cm straight Hydra Jagwire  was then passed       along side the PD stent and into the biliary tree. The short-nosed       traction sphincterotome was passed over the guidewire and the bile duct       was then deeply cannulated. Contrast was injected. Opacification of the       entire extrahepatic biliary tree and extending into the left and right       hepatic ducts and all intrahepatic branches was done. The maximum       diameter of the CBD was 12 mm. The lower third of the main bile duct       contained several 5mm filling defects. A short biliary sphincterotomy       was made with a traction (standard) sphincterotome using ERBE       electrocautery. There was no post-sphincterotomy bleeding. The biliary       tree was swept with a 12 mm balloon starting at the bifurcation. One       cholesterol stone was removed. Three more passes were made with the       balloon and no stones remained, confirmed on occlusion cholangiogram      The pancreatic duct stent was then removed with a snare and retrieved       intact. Impression:               - Choledocholithiasis was found. Complete removal                            was accomplished by biliary sphincterotomy and                            balloon extraction.                           - One plastic stent was placed into the ventral                            pancreatic duct to aid with biliary cannulation.                            The stent was then removed at the end of the case.                           -  A biliary sphincterotomy was performed.                           - The biliary tree was swept. Moderate Sedation:      GETA Recommendation:           - Advance diet as tolerated.                           - Avoid aspirin and nonsteroidal anti-inflammatory                            medicines for 1 week.                           - Watch for pancreatitis, bleeding, perforation,                            and cholangitis. Procedure Code(s):        ---  Professional ---                           [phone], 59, Endoscopic retrograde                            cholangiopancreatography (ERCP); with placement of                            endoscopic stent into biliary or pancreatic duct,                            including pre- and post-dilation and guide wire                            passage, when performed, including sphincterotomy,                            when performed, each stent                           [id number], Endoscopic retrograde                            cholangiopancreatography (ERCP); with removal of                            calculi/debris from biliary/pancreatic duct(s) Diagnosis Code(s):        --- Professional ---                           K80.50, Calculus of bile duct without cholangitis                            or cholecystitis without obstruction                           R74.8, Abnormal levels of other serum enzymes  R93.2, Abnormal findings on diagnostic imaging of                            liver and biliary tract CPT copyright 2016 [employer]. All rights reserved. The codes documented in this report are preliminary and upon coder review may  be revised to meet current compliance requirements. Delorise Hunkele L. Myrtie Neither, MD 10/29/2016 11:55:33 AM This report has been signed electronically. Number of Addenda: 0

## 2016-10-29 NOTE — Interval H&P Note (Signed)
History and Physical Interval Note:  10/29/2016 10:02 AM  Sarah Lee  has presented today for surgery, with the diagnosis of CBD stones  The various methods of treatment have been discussed with the patient and family. After consideration of risks, benefits and other options for treatment, the patient has consented to  Procedure(s): ENDOSCOPIC RETROGRADE CHOLANGIOPANCREATOGRAPHY (ERCP) (N/A) as a surgical intervention .  The patient's history has been reviewed, patient examined, no change in status, stable for surgery.  I have reviewed the patient's chart and labs.  Questions were answered to the patient's satisfaction.     Nelida Meuse III

## 2016-10-29 NOTE — Transfer of Care (Signed)
Immediate Anesthesia Transfer of Care Note  Patient: Sarah Lee  Procedure(s) Performed: Procedure(s): ENDOSCOPIC RETROGRADE CHOLANGIOPANCREATOGRAPHY (ERCP) (N/A)  Patient Location: PACU  Anesthesia Type:General  Level of Consciousness:  sedated, patient cooperative and responds to stimulation  Airway & Oxygen Therapy:Patient Spontanous Breathing and Patient connected to face mask oxgen  Post-op Assessment:  Report given to PACU RN and Post -op Vital signs reviewed and stable  Post vital signs:  Reviewed and stable  Last Vitals:  Vitals:   10/29/16 0500 10/29/16 0920  BP: 125/60 132/74  Pulse: 96 74  Resp: 16 20  Temp: 36.8 C 45.9 C    Complications: No apparent anesthesia complications

## 2016-10-29 NOTE — H&P (View-Only) (Signed)
Consultation  Referring Provider: Dr Zella Richer Primary Care Physician:  Rachell Cipro, MD Primary Gastroenterologist:  Former- Dr Deatra Ina  Reason for Consultation:  Choledocholithiasis  HPI: Sarah Lee is a 66 y.o. female who was admitted this morning after undergoing laparoscopic cholecystectomy per Dr. Zella Richer Patient had been having intermittent right upper quadrant pain, and ultrasound showed multiple gallstones without gallbladder wall thickening or pericholecystic fluid and CBD was 5.5 mm. This was in July 2017. At the time of surgery today, IOC was done and showed multiple filling defects in the distal common bile duct concerning for common bile duct stones. She was also noted to have a chronically inflamed gallbladder. Labs were done preoperatively on 02 /08/2017 with entirely normal liver function studies, and normal CBC. Patient is generally healthy with history of GERD, asthma, and family history of colon cancer. She had undergone screening colonoscopy in 2013 per Dr. Deatra Ina with finding of one 2 mm polyp which was benign/ no  adenomatous change was also noted to have mild left-sided diverticulosis.  Pt is still semi-sedated this afternoon.   Past Medical History:  Diagnosis Date  . Allergy   . Anemia   . Asthma    no medications  . Blood transfusion   . Cataract    slight  . Complication of anesthesia    used sodium pentothal and very hard to wake up and arm swelled up from medication  . Constipation   . Gallstones   . GERD (gastroesophageal reflux disease)   . Heart murmur   . Seasonal allergies     Past Surgical History:  Procedure Laterality Date  . ABDOMINAL HYSTERECTOMY  2004   fibroids--- TAH/BSO  . BREAST SURGERY     right breast biopsy-benign  . CESAREAN SECTION    . UTERINE FIBROID SURGERY      Prior to Admission medications   Medication Sig Start Date End Date Taking? Authorizing Provider  albuterol (PROVENTIL HFA;VENTOLIN HFA) 108 (90  Base) MCG/ACT inhaler Inhale 1-2 puffs into the lungs every 4 (four) hours as needed for wheezing or shortness of breath.    Historical Provider, MD  montelukast (SINGULAIR) 10 MG tablet Take 10 mg by mouth daily as needed.    Historical Provider, MD    Current Facility-Administered Medications  Medication Dose Route Frequency Provider Last Rate Last Dose  . albuterol (PROVENTIL) (2.5 MG/3ML) 0.083% nebulizer solution 3 mL  3 mL Inhalation Q4H PRN Jackolyn Confer, MD      . ciprofloxacin (CIPRO) IVPB 400 mg  400 mg Intravenous Q12H Jackolyn Confer, MD      . dextrose 5 % and 0.9 % NaCl with KCl 20 mEq/L infusion   Intravenous Continuous Jackolyn Confer, MD      . HYDROmorphone (DILAUDID) 1 MG/ML injection           . HYDROmorphone (DILAUDID) 1 MG/ML injection           . morphine 4 MG/ML injection 2-6 mg  2-6 mg Intravenous Q2H PRN Jackolyn Confer, MD      . ondansetron (ZOFRAN-ODT) disintegrating tablet 4 mg  4 mg Oral Q6H PRN Jackolyn Confer, MD       Or  . ondansetron [facility]) injection 4 mg  4 mg Intravenous Q4H PRN Jackolyn Confer, MD      . oxyCODONE (Oxy IR/ROXICODONE) immediate release tablet 5-10 mg  5-10 mg Oral Q4H PRN Jackolyn Confer, MD        Allergies as of 09/28/2016 - Review Complete  12/21/2015  Allergen Reaction Noted  . Iron Other (See Comments) 05/20/2011  . Penicillins Rash 05/20/2011  . Tetracyclines & related Nausea And Vomiting and Anxiety 05/20/2011    Family History  Problem Relation Age of Onset  . Cancer Father     prostate  . Anemia Mother   . Stomach cancer Cousin     maternal side   . Stomach cancer Maternal Uncle   . Colon cancer Neg Hx   . Esophageal cancer Neg Hx   . Rectal cancer Neg Hx     Social History   Social History  . Marital status: Married    Spouse name: N/A  . Number of children: N/A  . Years of education: N/A   Occupational History  .      unemployed   Social History Main Topics  . Smoking status: Former Smoker     Packs/day: 1.50    Years: 25.00    Types: Cigarettes    Quit date: 03/02/1988  . Smokeless tobacco: Never Used  . Alcohol use Yes     Comment: wine occassionally  . Drug use: No  . Sexual activity: Not Currently   Other Topics Concern  . Not on file   Social History Narrative   Exercise--  no    Review of Systems: Pertinent positive and negative review of systems were noted in the above HPI section.  All other review of systems was otherwise negative.Marland Kitchen  Physical Exam: Vital signs in last 24 hours: Temp:  [98.1 F (36.7 C)-98.6 F (37 C)] 98.3 F (36.8 C) (02/15 1038) Pulse Rate:  [68-105] 90 (02/15 1015) Resp:  [14-25] 16 (02/15 1038) BP: (118-138)/(60-72) 128/67 (02/15 1038) SpO2:  [98 %-100 %] 100 % (02/15 1038) Weight:  [230 lb (104.3 kg)] 230 lb (104.3 kg) (02/15 0546)   General:  Sleepy ,  Well-developed, well-nourished AA female , pleasant and cooperative in NAD Head:  Normocephalic and atraumatic. Eyes:  Sclera clear, no icterus.   Conjunctiva pink. Ears:  Normal auditory acuity. Nose:  No deformity, discharge,  or lesions. Mouth:  No deformity or lesions.   Neck:  Supple; no masses or thyromegaly. Lungs:  Clear throughout to auscultation.   No wheezes, crackles, or rhonchi. Heart:  Regular rate and rhythm; no murmurs, clicks, rubs,  or gallops. Abdomen:  Soft,tender, BS quiet,,nonpalp mass or hsm.   Rectal:  Deferred  Msk:  Symmetrical without gross deformities. . Pulses:  Normal pulses noted. Extremities:  Without clubbing or edema. Neurologic:  Alert and  oriented x4;  grossly normal neurologically. Skin:  Intact without significant lesions or rashes.. Psych:  Alert and cooperative. Normal mood and affect.  Intake/Output from previous day: No intake/output data recorded. Intake/Output this shift: Total I/O In: 900 [I.V.:900] Out: 25 [Blood:25]  Lab Results: No results for input(s): WBC, HGB, HCT, PLT in the last 72 hours. BMET No results for  input(s): NA, K, CL, CO2, GLUCOSE, BUN, CREATININE, CALCIUM in the last 72 hours. LFT No results for input(s): PROT, ALBUMIN, AST, ALT, ALKPHOS, BILITOT, BILIDIR, IBILI in the last 72 hours. PT/INR No results for input(s): LABPROT, INR in the last 72 hours. Hepatitis Panel No results for input(s): HEPBSAG, HCVAB, HEPAIGM, HEPBIGM in the last 72 hours.   IMPRESSION:  #1 66 year old female status post laparoscopic cholecystectomy earlier today with finding of multiple probable distal common bile duct stones on IOC. Normal LFTs preoperatively #2 history of GERD #3 asthma #4 Positive family history of colon cancer in  patient's father-patient up-to-date with colonoscopy last in 2013 will need follow-up sometime this year  PLAN: Will schedule for ERCP with Dr. Loletha Carrow  with stone extraction tomorrow Friday, 10/29/2016 at 10 am. Okay to eat later today and nothing by mouth past midnight. On Cipro-continue  Procedure discussed in detail with the patient including risks and benefits and she is agreeable to proceed.   Sarah Lee  10/28/2016, 11:24 AM

## 2016-10-30 ENCOUNTER — Encounter (HOSPITAL_COMMUNITY): Payer: Self-pay | Admitting: Surgery

## 2016-10-30 DIAGNOSIS — K8044 Calculus of bile duct with chronic cholecystitis without obstruction: Secondary | ICD-10-CM | POA: Diagnosis not present

## 2016-10-30 DIAGNOSIS — Z90710 Acquired absence of both cervix and uterus: Secondary | ICD-10-CM | POA: Diagnosis not present

## 2016-10-30 DIAGNOSIS — Y848 Other medical procedures as the cause of abnormal reaction of the patient, or of later complication, without mention of misadventure at the time of the procedure: Secondary | ICD-10-CM | POA: Diagnosis not present

## 2016-10-30 DIAGNOSIS — E876 Hypokalemia: Secondary | ICD-10-CM | POA: Diagnosis not present

## 2016-10-30 DIAGNOSIS — Z880 Allergy status to penicillin: Secondary | ICD-10-CM | POA: Diagnosis not present

## 2016-10-30 DIAGNOSIS — K660 Peritoneal adhesions (postprocedural) (postinfection): Secondary | ICD-10-CM | POA: Diagnosis not present

## 2016-10-30 DIAGNOSIS — R110 Nausea: Secondary | ICD-10-CM | POA: Diagnosis not present

## 2016-10-30 DIAGNOSIS — K858 Other acute pancreatitis without necrosis or infection: Secondary | ICD-10-CM

## 2016-10-30 DIAGNOSIS — Z888 Allergy status to other drugs, medicaments and biological substances status: Secondary | ICD-10-CM | POA: Diagnosis not present

## 2016-10-30 DIAGNOSIS — Z800 Family history of malignant neoplasm of digestive organs: Secondary | ICD-10-CM | POA: Diagnosis not present

## 2016-10-30 DIAGNOSIS — K8050 Calculus of bile duct without cholangitis or cholecystitis without obstruction: Secondary | ICD-10-CM | POA: Diagnosis not present

## 2016-10-30 DIAGNOSIS — K219 Gastro-esophageal reflux disease without esophagitis: Secondary | ICD-10-CM | POA: Diagnosis present

## 2016-10-30 DIAGNOSIS — J45909 Unspecified asthma, uncomplicated: Secondary | ICD-10-CM | POA: Diagnosis present

## 2016-10-30 DIAGNOSIS — E8770 Fluid overload, unspecified: Secondary | ICD-10-CM | POA: Diagnosis not present

## 2016-10-30 DIAGNOSIS — K5900 Constipation, unspecified: Secondary | ICD-10-CM | POA: Diagnosis not present

## 2016-10-30 DIAGNOSIS — Z87891 Personal history of nicotine dependence: Secondary | ICD-10-CM | POA: Diagnosis not present

## 2016-10-30 DIAGNOSIS — R1013 Epigastric pain: Secondary | ICD-10-CM | POA: Diagnosis not present

## 2016-10-30 DIAGNOSIS — K8581 Other acute pancreatitis with uninfected necrosis: Secondary | ICD-10-CM | POA: Diagnosis not present

## 2016-10-30 DIAGNOSIS — E669 Obesity, unspecified: Secondary | ICD-10-CM | POA: Diagnosis not present

## 2016-10-30 DIAGNOSIS — Z9889 Other specified postprocedural states: Secondary | ICD-10-CM | POA: Diagnosis not present

## 2016-10-30 DIAGNOSIS — K8060 Calculus of gallbladder and bile duct with cholecystitis, unspecified, without obstruction: Secondary | ICD-10-CM | POA: Diagnosis not present

## 2016-10-30 DIAGNOSIS — K859 Acute pancreatitis without necrosis or infection, unspecified: Secondary | ICD-10-CM

## 2016-10-30 DIAGNOSIS — K8070 Calculus of gallbladder and bile duct without cholecystitis without obstruction: Secondary | ICD-10-CM | POA: Diagnosis not present

## 2016-10-30 LAB — CBC
HCT: 38.2 % (ref 36.0–46.0)
Hemoglobin: 12.4 g/dL (ref 12.0–15.0)
MCH: 28.8 pg (ref 26.0–34.0)
MCHC: 32.5 g/dL (ref 30.0–36.0)
MCV: 88.6 fL (ref 78.0–100.0)
Platelets: 181 10*3/uL (ref 150–400)
RBC: 4.31 MIL/uL (ref 3.87–5.11)
RDW: 13.3 % (ref 11.5–15.5)
WBC: 12.9 10*3/uL — ABNORMAL HIGH (ref 4.0–10.5)

## 2016-10-30 LAB — COMPREHENSIVE METABOLIC PANEL
ALT: 190 U/L — ABNORMAL HIGH (ref 14–54)
AST: 89 U/L — ABNORMAL HIGH (ref 15–41)
Albumin: 3.5 g/dL (ref 3.5–5.0)
Alkaline Phosphatase: 71 U/L (ref 38–126)
Anion gap: 6 (ref 5–15)
BUN: 15 mg/dL (ref 6–20)
CO2: 28 mmol/L (ref 22–32)
Calcium: 8.8 mg/dL — ABNORMAL LOW (ref 8.9–10.3)
Chloride: 109 mmol/L (ref 101–111)
Creatinine, Ser: 0.89 mg/dL (ref 0.44–1.00)
GFR calc Af Amer: >60 mL/min (ref >60)
GFR calc non Af Amer: >60 mL/min (ref >60)
Glucose, Bld: 145 mg/dL — ABNORMAL HIGH (ref 65–99)
Potassium: 4.2 mmol/L (ref 3.5–5.1)
Sodium: 143 mmol/L (ref 135–145)
Total Bilirubin: 0.9 mg/dL (ref 0.3–1.2)
Total Protein: 6.2 g/dL — ABNORMAL LOW (ref 6.5–8.1)

## 2016-10-30 LAB — LIPASE, BLOOD: Lipase: 1514 U/L — ABNORMAL HIGH (ref 11–51)

## 2016-10-30 MED ORDER — LACTATED RINGERS IV SOLN
INTRAVENOUS | Status: DC
  Administered 2016-10-30 – 2016-11-02 (×4): via INTRAVENOUS

## 2016-10-30 MED ORDER — MENTHOL 3 MG MT LOZG: 1.0000 | LOZENGE | OROMUCOSAL | Status: DC | PRN

## 2016-10-30 MED ORDER — LACTATED RINGERS IV BOLUS (SEPSIS): 1000.0000 mL | Freq: Three times a day (TID) | INTRAVENOUS | Status: AC | PRN

## 2016-10-30 MED ORDER — METHOCARBAMOL 1000 MG/10ML IJ SOLN
1000.0000 mg | Freq: Four times a day (QID) | INTRAMUSCULAR | Status: DC | PRN
  Filled 2016-10-30 (×2): qty 10

## 2016-10-30 MED ORDER — PROCHLORPERAZINE EDISYLATE 5 MG/ML IJ SOLN
5.0000 mg | INTRAMUSCULAR | Status: DC | PRN
  Filled 2016-10-30: qty 2

## 2016-10-30 MED ORDER — ALUM & MAG HYDROXIDE-SIMETH 200-200-20 MG/5ML PO SUSP
30.0000 mL | Freq: Four times a day (QID) | ORAL | Status: DC | PRN
  Administered 2016-11-03 (×2): 30 mL via ORAL
  Filled 2016-10-30 (×2): qty 30

## 2016-10-30 MED ORDER — PANTOPRAZOLE SODIUM 40 MG PO TBEC
40.0000 mg | DELAYED_RELEASE_TABLET | Freq: Every day | ORAL | Status: DC
  Administered 2016-10-30 – 2016-11-03 (×5): 40 mg via ORAL
  Filled 2016-10-30 (×5): qty 1

## 2016-10-30 MED ORDER — PHENOL 1.4 % MT LIQD
2.0000 | OROMUCOSAL | Status: DC | PRN
Start: 2016-10-30 — End: 2016-11-04
  Filled 2016-10-30: qty 177

## 2016-10-30 MED ORDER — MAGIC MOUTHWASH
15.0000 mL | Freq: Four times a day (QID) | ORAL | Status: DC | PRN
  Filled 2016-10-30: qty 15

## 2016-10-30 MED ORDER — BISACODYL 10 MG RE SUPP
10.0000 mg | Freq: Two times a day (BID) | RECTAL | Status: DC | PRN
  Filled 2016-10-30: qty 1

## 2016-10-30 MED ORDER — IBUPROFEN 200 MG PO TABS
400.0000 mg | ORAL_TABLET | Freq: Four times a day (QID) | ORAL | Status: DC | PRN
  Filled 2016-10-30: qty 2

## 2016-10-30 MED ORDER — SACCHAROMYCES BOULARDII 250 MG PO CAPS
250.0000 mg | ORAL_CAPSULE | Freq: Two times a day (BID) | ORAL | Status: DC
  Administered 2016-10-30 – 2016-11-04 (×11): 250 mg via ORAL
  Filled 2016-10-30 (×11): qty 1

## 2016-10-30 MED ORDER — DIPHENHYDRAMINE HCL 50 MG/ML IJ SOLN: 12.5000 mg | Freq: Four times a day (QID) | INTRAMUSCULAR | Status: DC | PRN

## 2016-10-30 MED ORDER — HYDROMORPHONE HCL 2 MG/ML IJ SOLN
0.5000 mg | INTRAMUSCULAR | Status: DC | PRN
  Administered 2016-10-30: 0.5 mg via INTRAVENOUS
  Administered 2016-10-31: 1 mg via INTRAVENOUS
  Administered 2016-10-31: 2 mg via INTRAVENOUS
  Administered 2016-10-31 – 2016-11-01 (×4): 1 mg via INTRAVENOUS
  Administered 2016-11-01 (×2): 2 mg via INTRAVENOUS
  Administered 2016-11-01 – 2016-11-02 (×2): 1 mg via INTRAVENOUS
  Administered 2016-11-03: 0.5 mg via INTRAVENOUS
  Filled 2016-10-30 (×13): qty 1

## 2016-10-30 MED ORDER — ACETAMINOPHEN 500 MG PO TABS
1000.0000 mg | ORAL_TABLET | Freq: Three times a day (TID) | ORAL | Status: DC
  Administered 2016-10-30 – 2016-11-04 (×16): 1000 mg via ORAL
  Filled 2016-10-30 (×16): qty 2

## 2016-10-30 MED ORDER — METOPROLOL TARTRATE 12.5 MG HALF TABLET: 12.5000 mg | ORAL_TABLET | Freq: Two times a day (BID) | ORAL | Status: DC | PRN

## 2016-10-30 MED ORDER — LIP MEDEX EX OINT
1.0000 "application " | TOPICAL_OINTMENT | Freq: Two times a day (BID) | CUTANEOUS | Status: DC
  Administered 2016-10-30 – 2016-11-03 (×10): 1 via TOPICAL
  Filled 2016-10-30: qty 7

## 2016-10-30 MED ORDER — LACTATED RINGERS IV BOLUS (SEPSIS)
1000.0000 mL | Freq: Once | INTRAVENOUS | Status: AC
  Administered 2016-10-30: 1000 mL via INTRAVENOUS

## 2016-10-30 MED ORDER — METOPROLOL TARTRATE 5 MG/5ML IV SOLN: 5.0000 mg | Freq: Four times a day (QID) | INTRAVENOUS | Status: DC | PRN

## 2016-10-30 MED ORDER — MONTELUKAST SODIUM 10 MG PO TABS
10.0000 mg | ORAL_TABLET | Freq: Every day | ORAL | Status: DC
  Administered 2016-10-30 – 2016-11-03 (×5): 10 mg via ORAL
  Filled 2016-10-30 (×5): qty 1

## 2016-10-30 NOTE — Progress Notes (Signed)
[facility]  [address] [place], [place] [address] Phone: [phone] FAX: [phone]   Sarah Lee [id number] Jan 08, 1951    Problem List:   Principal Problem:   Post ERCP Pancreatitis Active Problems:   Obesity   Chronic cholecystitis with calculus s/p lap cholecystectomy 10/28/2016   Choledocholithiasis s/p ERCP 10/29/2016   Asthma   GERD (gastroesophageal reflux disease)   1 Day Post-Op  10/28/2016 - 10/29/2016  Procedure(s): ENDOSCOPIC RETROGRADE CHOLANGIOPANCREATOGRAPHY (ERCP)   Assessment  Post ERCP pancreatitis  Plan:  -IVF bolus - volume resucitation -NPO & bowel rest -Follow clinically.  CT scan if does not improve in 24-48 hours to rule out necrosis/pseudocyst. -Parenteral nutrition if cannot tolerate by mouth or does not improve within the next few days -Improve pain control -PPI for GERD. -Asthma.  Singulair scheduled.  Albuterol when necessary. -VTE prophylaxis- SCDs, etc -mobilize as tolerated to help recovery  Adin Hector, M.D., F.A.C.S. Gastrointestinal and Minimally Invasive Surgery [facility] [address] [place], [place] [address] [phone] Main / Paging   10/30/2016  CARE TEAM:  PCP: Rachell Cipro, MD  Outpatient Care Team: Patient Care Team: Fanny Bien, MD as PCP - General (Family Medicine)  Inpatient Treatment Team: Treatment Team: Attending Provider: Jackolyn Confer, MD; Technician: Sueanne Margarita, NT; Consulting Physician: Mauri Pole, MD; Consulting Physician: Nolon Nations, MD; Registered Nurse: Nolene Ebbs, RN  Subjective:  Very sore.  Worse when trying to separate drink anything.  Pain medication helped somewhat  Nurse just outside room.  Objective:  Vital signs:  Vitals:   10/29/16 1350 10/29/16 1552 10/29/16 2155 10/30/16 0521  BP: 137/65 124/61 119/62 (!) 124/58  Pulse: 72 80 73 81  Resp: 18 18 18 16    Temp: 97.9 F (36.6 C) 98.3 F (36.8 C) 98 F (36.7 C) 98.4 F (36.9 C)  TempSrc: Oral Oral Oral Oral  SpO2: 98% 97% 97% 95%  Weight:      Height:        Last BM Date: 10/28/16  Intake/Output   Yesterday:  02/16 0701 - 02/17 0700 In: 3790.4 [P.O.:480; I.V.:3310.4] Out: 1710 [Urine:1700; Blood:10] This shift:  No intake/output data recorded.  Bowel function:  Flatus: No  BM:  No  Drain: (No drain)   Physical Exam:  General: Pt awake/alert/oriented x4 in moderate acute distress Eyes: PERRL, normal EOM.  Sclera clear.  No icterus Neuro: CN II-XII intact w/o focal sensory/motor deficits. Lymph: No head/neck/groin lymphadenopathy Psych:  No delerium/psychosis/paranoia HENT: Normocephalic, Mucus membranes moist.  No thrush Neck: Supple, No tracheal deviation Chest: No chest wall pain w good excursion CV:  Pulses intact.  Regular rhythm MS: Normal AROM mjr joints.  No obvious deformity Abdomen: Somewhat firm.  Mildy distended.  Tenderness at Epigastric region primarily..  No evidence of peritonitis.  No incarcerated hernias. Ext:  SCDs BLE.  No mjr edema.  No cyanosis Skin: No petechiae / purpura  Results:   Labs: Results for orders placed or performed during the hospital encounter of 10/28/16 (from the past 48 hour(s))  Comprehensive metabolic panel     Status: Abnormal   Collection Time: 10/29/16  5:57 AM  Result Value Ref Range   Sodium 142 135 - 145 mmol/L   Potassium 4.0 3.5 - 5.1 mmol/L   Chloride 110 101 - 111 mmol/L   CO2 27 22 - 32 mmol/L   Glucose, Bld 117 (H) 65 - 99 mg/dL  BUN 7 6 - 20 mg/dL   Creatinine, Ser 0.67 0.44 - 1.00 mg/dL   Calcium 9.2 8.9 - 10.3 mg/dL   Total Protein 6.3 (L) 6.5 - 8.1 g/dL   Albumin 3.5 3.5 - 5.0 g/dL   AST 171 (H) 15 - 41 U/L   ALT 243 (H) 14 - 54 U/L   Alkaline Phosphatase 72 38 - 126 U/L   Total Bilirubin 1.0 0.3 - 1.2 mg/dL   GFR calc non Af Amer >60 >60 mL/min   GFR calc Af Amer >60 >60 mL/min    Comment:  (NOTE) The eGFR has been calculated using the CKD EPI equation. This calculation has not been validated in all clinical situations. eGFR's persistently <60 mL/min signify possible Chronic Kidney Disease.    Anion gap 5 5 - 15  CBC     Status: Abnormal   Collection Time: 10/30/16  5:18 AM  Result Value Ref Range   WBC 12.9 (H) 4.0 - 10.5 K/uL   RBC 4.31 3.87 - 5.11 MIL/uL   Hemoglobin 12.4 12.0 - 15.0 g/dL   HCT 38.2 36.0 - 46.0 %   MCV 88.6 78.0 - 100.0 fL   MCH 28.8 26.0 - 34.0 pg   MCHC 32.5 30.0 - 36.0 g/dL   RDW 13.3 11.5 - 15.5 %   Platelets 181 150 - 400 K/uL  Comprehensive metabolic panel     Status: Abnormal   Collection Time: 10/30/16  5:18 AM  Result Value Ref Range   Sodium 143 135 - 145 mmol/L   Potassium 4.2 3.5 - 5.1 mmol/L   Chloride 109 101 - 111 mmol/L   CO2 28 22 - 32 mmol/L   Glucose, Bld 145 (H) 65 - 99 mg/dL   BUN 15 6 - 20 mg/dL   Creatinine, Ser 0.89 0.44 - 1.00 mg/dL   Calcium 8.8 (L) 8.9 - 10.3 mg/dL   Total Protein 6.2 (L) 6.5 - 8.1 g/dL   Albumin 3.5 3.5 - 5.0 g/dL   AST 89 (H) 15 - 41 U/L   ALT 190 (H) 14 - 54 U/L   Alkaline Phosphatase 71 38 - 126 U/L   Total Bilirubin 0.9 0.3 - 1.2 mg/dL   GFR calc non Af Amer >60 >60 mL/min   GFR calc Af Amer >60 >60 mL/min    Comment: (NOTE) The eGFR has been calculated using the CKD EPI equation. This calculation has not been validated in all clinical situations. eGFR's persistently <60 mL/min signify possible Chronic Kidney Disease.    Anion gap 6 5 - 15  Lipase, blood     Status: Abnormal   Collection Time: 10/30/16  5:18 AM  Result Value Ref Range   Lipase 1,514 (H) 11 - 51 U/L    Comment: RESULTS CONFIRMED BY MANUAL DILUTION    Imaging / Studies: Dg Ercp With Sphincterotomy  Result Date: 10/29/2016 CLINICAL DATA:  66 year old female with a history of intraoperative ERCP, choledocholithiasis EXAM: ERCP TECHNIQUE: Multiple spot images obtained with the fluoroscopic device and submitted for  interpretation post-procedure. FLUOROSCOPY TIME:  Fluoroscopy Time:  4 minutes 15 second COMPARISON:  Intraoperative cholangiogram 10/28/2016 FINDINGS: Multiple intraoperative fluoroscopic spot images during ERCP. Initial images demonstrate endoscope projecting over the upper abdomen with cannulation of the ampulla and a safety wire in position. Partial opacification of the extrahepatic biliary system is evident, during reported treatment of choledocholithiasis. No large filling defect identified at the completion of the study, however, there is incomplete opacification. IMPRESSION: Limited images  during ERCP, demonstrate treatment for choledocholithiasis. No large filling defect at the conclusion, although there is incomplete opacification of the ductal system. Please refer to the dictated operative report for full details of intraoperative findings and procedure. Signed, Dulcy Fanny. Earleen Newport, DO [facility] Radiology Electronically Signed   By: Corrie Mckusick D.O.   On: 10/29/2016 12:04    Medications / Allergies: per chart  Antibiotics: Anti-infectives    Start     Dose/Rate Route Frequency Ordered Stop   10/29/16 1015  ciprofloxacin (CIPRO) IVPB 400 mg     400 mg 200 mL/hr over 60 Minutes Intravenous  Once 10/29/16 1004 10/29/16 1020   10/28/16 1130  ciprofloxacin (CIPRO) IVPB 400 mg     400 mg 200 mL/hr over 60 Minutes Intravenous Every 12 hours 10/28/16 1046 10/28/16 1321   10/28/16 0531  ciprofloxacin (CIPRO) IVPB 400 mg     400 mg 200 mL/hr over 60 Minutes Intravenous On call to O.R. 10/28/16 0531 10/28/16 0731        Note: Portions of this report may have been transcribed using voice recognition software. Every effort was made to ensure accuracy; however, inadvertent computerized transcription errors may be present.   Any transcriptional errors that result from this process are unintentional.     Adin Hector, M.D.,  F.A.C.S. Gastrointestinal and Minimally Invasive Surgery [facility] [address] [place], [place] [address] [phone] Main / Paging   10/30/2016

## 2016-10-30 NOTE — Progress Notes (Signed)
[facility] ROUNDING NOTE   Subjective: Complains of epigastric abdominal pain and nausea Status post ERCP yesterday with with biliary sphincterotomy and CBD stone extraction   Objective: Vital signs in last 24 hours: Temp:  [97.8 F (36.6 C)-98.4 F (36.9 C)] 98.4 F (36.9 C) (02/17 0521) Pulse Rate:  [64-87] 81 (02/17 0521) Resp:  [12-23] 16 (02/17 0521) BP: (109-137)/(56-101) 124/58 (02/17 0521) SpO2:  [95 %-100 %] 95 % (02/17 0521) Last BM Date: 10/28/16 General: NAD Abdomen: Soft, no distention, mild diffuse tenderness Ext: No edema    Intake/Output from previous day: 02/16 0701 - 02/17 0700 In: 3790.4 [P.O.:480; I.V.:3310.4] Out: 1710 [Urine:1700; Blood:10] Intake/Output this shift: No intake/output data recorded.   Lab Results:  Recent Labs  10/30/16 0518  WBC 12.9*  HGB 12.4  PLT 181  MCV 88.6   BMET  Recent Labs  10/29/16 0557 10/30/16 0518  NA 142 143  K 4.0 4.2  CL 110 109  CO2 27 28  GLUCOSE 117* 145*  BUN 7 15  CREATININE 0.67 0.89  CALCIUM 9.2 8.8*   LFT  Recent Labs  10/29/16 0557 10/30/16 0518  PROT 6.3* 6.2*  ALBUMIN 3.5 3.5  AST 171* 89*  ALT 243* 190*  ALKPHOS 72 71  BILITOT 1.0 0.9   PT/INR No results for input(s): INR in the last 72 hours.    Imaging/Other results: Dg Ercp With Sphincterotomy  Result Date: 10/29/2016 CLINICAL DATA:  66 year old female with a history of intraoperative ERCP, choledocholithiasis EXAM: ERCP TECHNIQUE: Multiple spot images obtained with the fluoroscopic device and submitted for interpretation post-procedure. FLUOROSCOPY TIME:  Fluoroscopy Time:  4 minutes 15 second COMPARISON:  Intraoperative cholangiogram 10/28/2016 FINDINGS: Multiple intraoperative fluoroscopic spot images during ERCP. Initial images demonstrate endoscope projecting over the upper abdomen with cannulation of the ampulla and a safety wire in position. Partial opacification of the extrahepatic biliary system  is evident, during reported treatment of choledocholithiasis. No large filling defect identified at the completion of the study, however, there is incomplete opacification. IMPRESSION: Limited images during ERCP, demonstrate treatment for choledocholithiasis. No large filling defect at the conclusion, although there is incomplete opacification of the ductal system. Please refer to the dictated operative report for full details of intraoperative findings and procedure. Signed, Dulcy Fanny. Earleen Newport, DO Vascular and Interventional Radiology Specialists [facility] Radiology Electronically Signed   By: Corrie Mckusick D.O.   On: 10/29/2016 12:04    Assessment &Plan  66 year old female status post laparoscopic cholecystectomy for symptomatic cholelithiasis with evidence of choledocholithiasis on intraoperative cholangiogram status post ERCP 08/28/2017 with complaints of mild epigastric pain concerning for mild post-ercp pancreatitis Nothing by mouth except small sips of water and ice chips Continue IV fluids Slowly advance diet as tolerated  Pain control  K. Denzil Magnuson , MD [phone] Mon-Fri 8a-5p [phone] after 5p, weekends, holidays [facility]

## 2016-10-31 LAB — COMPREHENSIVE METABOLIC PANEL
ALT: 134 U/L — ABNORMAL HIGH (ref 14–54)
AST: 51 U/L — ABNORMAL HIGH (ref 15–41)
Albumin: 3.3 g/dL — ABNORMAL LOW (ref 3.5–5.0)
Alkaline Phosphatase: 61 U/L (ref 38–126)
Anion gap: 6 (ref 5–15)
BUN: 14 mg/dL (ref 6–20)
CO2: 29 mmol/L (ref 22–32)
Calcium: 8.5 mg/dL — ABNORMAL LOW (ref 8.9–10.3)
Chloride: 105 mmol/L (ref 101–111)
Creatinine, Ser: 0.92 mg/dL (ref 0.44–1.00)
GFR calc Af Amer: >60 mL/min (ref >60)
GFR calc non Af Amer: >60 mL/min (ref >60)
Glucose, Bld: 101 mg/dL — ABNORMAL HIGH (ref 65–99)
Potassium: 4 mmol/L (ref 3.5–5.1)
Sodium: 140 mmol/L (ref 135–145)
Total Bilirubin: 1 mg/dL (ref 0.3–1.2)
Total Protein: 6.1 g/dL — ABNORMAL LOW (ref 6.5–8.1)

## 2016-10-31 LAB — CBC
HCT: 38.1 % (ref 36.0–46.0)
Hemoglobin: 12.3 g/dL (ref 12.0–15.0)
MCH: 28.9 pg (ref 26.0–34.0)
MCHC: 32.3 g/dL (ref 30.0–36.0)
MCV: 89.4 fL (ref 78.0–100.0)
Platelets: 160 10*3/uL (ref 150–400)
RBC: 4.26 MIL/uL (ref 3.87–5.11)
RDW: 13.8 % (ref 11.5–15.5)
WBC: 18.6 10*3/uL — ABNORMAL HIGH (ref 4.0–10.5)

## 2016-10-31 LAB — LIPASE, BLOOD: Lipase: 171 U/L — ABNORMAL HIGH (ref 11–51)

## 2016-10-31 MED ORDER — IBUPROFEN 200 MG PO TABS
600.0000 mg | ORAL_TABLET | Freq: Four times a day (QID) | ORAL | Status: AC
  Filled 2016-10-31: qty 3

## 2016-10-31 NOTE — Progress Notes (Addendum)
[facility]  [address] [place], [place] [address] Phone: [phone] FAX: [phone]   Sarah Lee [id number] 11/22/1950    Problem List:   Principal Problem:   Post ERCP Pancreatitis Active Problems:   Obesity   Chronic cholecystitis with calculus s/p lap cholecystectomy 10/28/2016   Choledocholithiasis s/p ERCP 10/29/2016   Asthma   GERD (gastroesophageal reflux disease)  OPERATIVE NOTE-LAPAROSCOPIC CHOLECYSTECTOMY 10/28/2016  Preoperative diagnosis:  Symptomatic cholelithiasis  Postoperative diagnosis:  Same plus choledocholithiasis  Procedure: Laparoscopic cholecystectomy with cholangiogram.  Surgeon: Jackolyn Confer, M.D.  Indication:   This is a 66 year old female whose had intermittent symptomatic cholelithiasis depending on what she eats. She has multiple gallstones on her ultrasound and a normal common bile duct diameter.  Preoperative liver function tests are normal. She now presents for elective cholecystectomy.  Findings: The fundus and body of the gallbladder were intrahepatic. 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 There were multiple filling defects in the distal common bile duct concerning for common bile duct stones on cholangiogram.  There was a dense adhesion between the small intestine and anterior abdominal wall 5 cm inferior to the umbilicus.   2 Days Post-Op  10/29/2016  Procedure(s): ENDOSCOPIC RETROGRADE CHOLANGIOPANCREATOGRAPHY (ERCP)   Assessment  Post ERCP pancreatitis  Plan:  -IVF bolus - volume resucitation.  Increased per GI -Try clear liquids and advanced.  Low-fat diet as tolerated   -Follow clinically.  CT scan if does not improve in 24-48 hours to rule out necrosis/pseudocyst. Less pain today and no hemodynamic compromise reassuring.  Hopefully, the leukocytosis is peaking.  Follow numbers & CLINICAL  EXAM.  -Parenteral nutrition if cannot tolerate by mouth or does not improve within the next few days -Improve pain control.  Try scheduled nonsteroidal.  Dilaudid seems to work for breakthrough  -PPI for GERD. -Asthma.  Singulair scheduled.  Albuterol when necessary. -VTE prophylaxis- SCDs, etc -mobilize as tolerated to help recovery  Adin Hector, M.D., F.A.C.S. Gastrointestinal and Minimally Invasive Surgery [facility] [address] [place], [place] [address] [phone] Main / Paging   10/31/2016  CARE TEAM:  PCP: Rachell Cipro, MD  Outpatient Care Team: Patient Care Team: Fanny Bien, MD as PCP - General (Family Medicine)  Inpatient Treatment Team: Treatment Team: Attending Provider: Jackolyn Confer, MD; Technician: Sueanne Margarita, NT; Consulting Physician: Mauri Pole, MD; Consulting Physician: Nolon Nations, MD; Registered Nurse: Mickie Kay, RN; Registered Nurse: Griffin Basil, RN  Subjective:  Less sore.  Tylenol not enough.  Dilaudid helps.   Walking around in room only Nurse just outside room.  Objective:  Vital signs:  Vitals:   10/30/16 1411 10/30/16 1530 10/30/16 2113 10/31/16 0533  BP: (!) 141/67  (!) 142/75 128/67  Pulse: 85  86 91  Resp: _0 Temp: 99 F (37.2 C) 98.4 F (36.9 C) 98.8 F (37.1 C) 98.1 F (36.7 C)  TempSrc: Oral  Oral Oral  SpO2: 95%  95% 97%  Weight:      Height:        Last BM Date: 10/30/16  Intake/Output   Yesterday:  02/17 0701 - 02/18 0700 In: 1716.7 [I.V.:1716.7] Out: 500 [Urine:500] This shift:  Total I/O In: 520 [P.O.:120; I.V.:400] Out: 100 [Urine:100]  Bowel function:  Flatus: No  BM:  No  Drain: (No drain)   Physical Exam:  General: Pt awake/alert/oriented x4  in No acute distress Eyes: PERRL, normal EOM.  Sclera clear.  No icterus Neuro: CN II-XII intact w/o focal sensory/motor deficits. Lymph: No head/neck/groin lymphadenopathy Psych:  No  delerium/psychosis/paranoia HENT: Normocephalic, Mucus membranes moist.  No thrush Neck: Supple, No tracheal deviation Chest: No chest wall pain w good excursion CV:  Pulses intact.  Regular rhythm MS: Normal AROM mjr joints.  No obvious deformity Abdomen: Somewhat firm.  Nondistended.  Tenderness at Epigastric region primarily.  dressings clean.  I removed them..  No evidence of peritonitis.  No incarcerated hernias. Ext:  SCDs BLE.  No mjr edema.  No cyanosis Skin: No petechiae / purpura  Results:   Labs: Results for orders placed or performed during the hospital encounter of 10/28/16 (from the past 48 hour(s))  CBC     Status: Abnormal   Collection Time: 10/30/16  5:18 AM  Result Value Ref Range   WBC 12.9 (H) 4.0 - 10.5 K/uL   RBC 4.31 3.87 - 5.11 MIL/uL   Hemoglobin 12.4 12.0 - 15.0 g/dL   HCT 38.2 36.0 - 46.0 %   MCV 88.6 78.0 - 100.0 fL   MCH 28.8 26.0 - 34.0 pg   MCHC 32.5 30.0 - 36.0 g/dL   RDW 13.3 11.5 - 15.5 %   Platelets 181 150 - 400 K/uL  Comprehensive metabolic panel     Status: Abnormal   Collection Time: 10/30/16  5:18 AM  Result Value Ref Range   Sodium 143 135 - 145 mmol/L   Potassium 4.2 3.5 - 5.1 mmol/L   Chloride 109 101 - 111 mmol/L   CO2 28 22 - 32 mmol/L   Glucose, Bld 145 (H) 65 - 99 mg/dL   BUN 15 6 - 20 mg/dL   Creatinine, Ser 0.89 0.44 - 1.00 mg/dL   Calcium 8.8 (L) 8.9 - 10.3 mg/dL   Total Protein 6.2 (L) 6.5 - 8.1 g/dL   Albumin 3.5 3.5 - 5.0 g/dL   AST 89 (H) 15 - 41 U/L   ALT 190 (H) 14 - 54 U/L   Alkaline Phosphatase 71 38 - 126 U/L   Total Bilirubin 0.9 0.3 - 1.2 mg/dL   GFR calc non Af Amer >60 >60 mL/min   GFR calc Af Amer >60 >60 mL/min    Comment: (NOTE) The eGFR has been calculated using the CKD EPI equation. This calculation has not been validated in all clinical situations. eGFR's persistently <60 mL/min signify possible Chronic Kidney Disease.    Anion gap 6 5 - 15  Lipase, blood     Status: Abnormal   Collection Time:  10/30/16  5:18 AM  Result Value Ref Range   Lipase 1,514 (H) 11 - 51 U/L    Comment: RESULTS CONFIRMED BY MANUAL DILUTION  Lipase, blood     Status: Abnormal   Collection Time: 10/31/16  5:28 AM  Result Value Ref Range   Lipase 171 (H) 11 - 51 U/L  Comprehensive metabolic panel     Status: Abnormal   Collection Time: 10/31/16  5:28 AM  Result Value Ref Range   Sodium 140 135 - 145 mmol/L   Potassium 4.0 3.5 - 5.1 mmol/L   Chloride 105 101 - 111 mmol/L   CO2 29 22 - 32 mmol/L   Glucose, Bld 101 (H) 65 - 99 mg/dL   BUN 14 6 - 20 mg/dL   Creatinine, Ser 0.92 0.44 - 1.00 mg/dL   Calcium 8.5 (L) 8.9 - 10.3 mg/dL   Total  Protein 6.1 (L) 6.5 - 8.1 g/dL   Albumin 3.3 (L) 3.5 - 5.0 g/dL   AST 51 (H) 15 - 41 U/L   ALT 134 (H) 14 - 54 U/L   Alkaline Phosphatase 61 38 - 126 U/L   Total Bilirubin 1.0 0.3 - 1.2 mg/dL   GFR calc non Af Amer >60 >60 mL/min   GFR calc Af Amer >60 >60 mL/min    Comment: (NOTE) The eGFR has been calculated using the CKD EPI equation. This calculation has not been validated in all clinical situations. eGFR's persistently <60 mL/min signify possible Chronic Kidney Disease.    Anion gap 6 5 - 15  CBC     Status: Abnormal   Collection Time: 10/31/16  5:28 AM  Result Value Ref Range   WBC 18.6 (H) 4.0 - 10.5 K/uL   RBC 4.26 3.87 - 5.11 MIL/uL   Hemoglobin 12.3 12.0 - 15.0 g/dL   HCT 38.1 36.0 - 46.0 %   MCV 89.4 78.0 - 100.0 fL   MCH 28.9 26.0 - 34.0 pg   MCHC 32.3 30.0 - 36.0 g/dL   RDW 13.8 11.5 - 15.5 %   Platelets 160 150 - 400 K/uL    Imaging / Studies: Dg Ercp With Sphincterotomy  Result Date: 10/29/2016 CLINICAL DATA:  66 year old female with a history of intraoperative ERCP, choledocholithiasis EXAM: ERCP TECHNIQUE: Multiple spot images obtained with the fluoroscopic device and submitted for interpretation post-procedure. FLUOROSCOPY TIME:  Fluoroscopy Time:  4 minutes 15 second COMPARISON:  Intraoperative cholangiogram 10/28/2016 FINDINGS:  Multiple intraoperative fluoroscopic spot images during ERCP. Initial images demonstrate endoscope projecting over the upper abdomen with cannulation of the ampulla and a safety wire in position. Partial opacification of the extrahepatic biliary system is evident, during reported treatment of choledocholithiasis. No large filling defect identified at the completion of the study, however, there is incomplete opacification. IMPRESSION: Limited images during ERCP, demonstrate treatment for choledocholithiasis. No large filling defect at the conclusion, although there is incomplete opacification of the ductal system. Please refer to the dictated operative report for full details of intraoperative findings and procedure. Signed, Dulcy Fanny. Earleen Newport, DO V[facility] Inter[facility] Radiology Specialists [facility] Radiology Electronically Signed   By: Corrie Mckusick D.O.   On: 10/29/2016 12:04    Medications / Allergies: per chart  Antibiotics: Anti-infectives    Start     Dose/Rate Route Frequency Ordered Stop   10/29/16 1015  ciprofloxacin (CIPRO) IVPB 400 mg     400 mg 200 mL/hr over 60 Minutes Intravenous  Once 10/29/16 1004 10/29/16 1020   10/28/16 1130  ciprofloxacin (CIPRO) IVPB 400 mg     400 mg 200 mL/hr over 60 Minutes Intravenous Every 12 hours 10/28/16 1046 10/28/16 1321   10/28/16 0531  ciprofloxacin (CIPRO) IVPB 400 mg     400 mg 200 mL/hr over 60 Minutes Intravenous On call to O.R. 10/28/16 0531 10/28/16 0731        Note: Portions of this report may have been transcribed using voice recognition software. Every effort was made to ensure accuracy; however, inadvertent computerized transcription errors may be present.   Any transcriptional errors that result from this process are unintentional.     Adin Hector, M.D., F.A.C.S. Gastrointestinal and Minimally Invasive Surgery [facility] [address] [place], [place] [address] [phone] Main /  Paging   10/31/2016

## 2016-10-31 NOTE — Progress Notes (Signed)
Patient ID: Sarah Lee, female   DOB: [birth year], 66 y.o.   MRN: [id number]    Progress Note   Subjective   Feels better than yesterday , hungry- still hurting though and using IV pain meds, no nausea    Objective   Vital signs in last 24 hours: Temp:  [98.1 F (36.7 C)-99 F (37.2 C)] 98.1 F (36.7 C) (02/18 0533) Pulse Rate:  [85-91] 91 (02/18 0533) Resp:  [15-18] 18 (02/18 0533) BP: (128-142)/(67-75) 128/67 (02/18 0533) SpO2:  [95 %-97 %] 97 % (02/18 0533) Last BM Date: 10/30/16 General:  AA female in NAD Heart:  Regular rate and rhythm; no murmurs Lungs: Respirations even and unlabored, lungs CTA bilaterally Abdomen:  Soft, tender across upper abdomen and nondistended. BS present Extremities:  Without edema. Neurologic:  Alert and oriented,  grossly normal neurologically. Psych:  Cooperative. Normal mood and affect.  Intake/Output from previous day: 02/17 0701 - 02/18 0700 In: 1616.7 [I.V.:1616.7] Out: 500 [Urine:500] Intake/Output this shift: No intake/output data recorded.  Lab Results:  Recent Labs  10/30/16 0518 10/31/16 0528  WBC 12.9* 18.6*  HGB 12.4 12.3  HCT 38.2 38.1  PLT 181 160   BMET  Recent Labs  10/29/16 0557 10/30/16 0518 10/31/16 0528  NA 142 143 140  K 4.0 4.2 4.0  CL 110 109 105  CO2 '27 28 29  '$ GLUCOSE 117* 145* 101*  BUN '7 15 14  '$ CREATININE 0.67 0.89 0.92  CALCIUM 9.2 8.8* 8.5*   LFT  Recent Labs  10/31/16 0528  PROT 6.1*  ALBUMIN 3.3*  AST 51*  ALT 134*  ALKPHOS 61  BILITOT 1.0   PT/INR No results for input(s): LABPROT, INR in the last 72 hours.  Studies/Results: Dg Ercp With Sphincterotomy  Result Date: 10/29/2016 CLINICAL DATA:  66 year old female with a history of intraoperative ERCP, choledocholithiasis EXAM: ERCP TECHNIQUE: Multiple spot images obtained with the fluoroscopic device and submitted for interpretation post-procedure. FLUOROSCOPY TIME:  Fluoroscopy Time:  4 minutes 15 second COMPARISON:   Intraoperative cholangiogram 10/28/2016 FINDINGS: Multiple intraoperative fluoroscopic spot images during ERCP. Initial images demonstrate endoscope projecting over the upper abdomen with cannulation of the ampulla and a safety wire in position. Partial opacification of the extrahepatic biliary system is evident, during reported treatment of choledocholithiasis. No large filling defect identified at the completion of the study, however, there is incomplete opacification. IMPRESSION: Limited images during ERCP, demonstrate treatment for choledocholithiasis. No large filling defect at the conclusion, although there is incomplete opacification of the ductal system. Please refer to the dictated operative report for full details of intraoperative findings and procedure. Signed, Dulcy Fanny. Earleen Newport, DO V[facility] and Interventional Radiology Specialists [facility] Radiology Electronically Signed   By: Corrie Mckusick D.O.   On: 10/29/2016 12:04     IMP/PLAN;  #1 66 yo female with acute post ERCP pancreatitis s/p ERCP with stone extraction 2/16 /18 She is stable but has  rising WBC- other parameters improved  Plan; Start clear liquids gingerly Increase fluid to 150 cc /hr Repeat labs in am  Will continue to follow   Principal Problem:   Post ERCP Pancreatitis Active Problems:   Obesity   Chronic cholecystitis with calculus s/p lap cholecystectomy 10/28/2016   Choledocholithiasis s/p ERCP 10/29/2016   Asthma   GERD (gastroesophageal reflux disease)     LOS: 1 day   Courage Biglow  10/31/2016, 8:23 AM

## 2016-11-01 ENCOUNTER — Encounter (HOSPITAL_COMMUNITY): Payer: Self-pay | Admitting: Gastroenterology

## 2016-11-01 DIAGNOSIS — R1013 Epigastric pain: Secondary | ICD-10-CM

## 2016-11-01 DIAGNOSIS — K8581 Other acute pancreatitis with uninfected necrosis: Secondary | ICD-10-CM

## 2016-11-01 LAB — CBC
HCT: 32.8 % — ABNORMAL LOW (ref 36.0–46.0)
Hemoglobin: 10.7 g/dL — ABNORMAL LOW (ref 12.0–15.0)
MCH: 28.9 pg (ref 26.0–34.0)
MCHC: 32.6 g/dL (ref 30.0–36.0)
MCV: 88.6 fL (ref 78.0–100.0)
Platelets: 144 10*3/uL — ABNORMAL LOW (ref 150–400)
RBC: 3.7 MIL/uL — ABNORMAL LOW (ref 3.87–5.11)
RDW: 13.6 % (ref 11.5–15.5)
WBC: 16.1 10*3/uL — ABNORMAL HIGH (ref 4.0–10.5)

## 2016-11-01 LAB — BASIC METABOLIC PANEL
Anion gap: 5 (ref 5–15)
BUN: 15 mg/dL (ref 6–20)
CO2: 28 mmol/L (ref 22–32)
Calcium: 8.6 mg/dL — ABNORMAL LOW (ref 8.9–10.3)
Chloride: 103 mmol/L (ref 101–111)
Creatinine, Ser: 0.9 mg/dL (ref 0.44–1.00)
GFR calc Af Amer: >60 mL/min (ref >60)
GFR calc non Af Amer: >60 mL/min (ref >60)
Glucose, Bld: 87 mg/dL (ref 65–99)
Potassium: 4 mmol/L (ref 3.5–5.1)
Sodium: 136 mmol/L (ref 135–145)

## 2016-11-01 LAB — LIPASE, BLOOD: Lipase: 30 U/L (ref 11–51)

## 2016-11-01 MED ORDER — ENOXAPARIN SODIUM 40 MG/0.4ML ~~LOC~~ SOLN
40.0000 mg | SUBCUTANEOUS | Status: DC
  Administered 2016-11-01 – 2016-11-04 (×4): 40 mg via SUBCUTANEOUS
  Filled 2016-11-01 (×4): qty 0.4

## 2016-11-01 MED ORDER — PANCRELIPASE (LIP-PROT-AMYL) 12000-38000 UNITS PO CPEP
36000.0000 [IU] | ORAL_CAPSULE | Freq: Three times a day (TID) | ORAL | Status: DC
  Administered 2016-11-01 – 2016-11-03 (×5): 36000 [IU] via ORAL
  Filled 2016-11-01 (×5): qty 3

## 2016-11-01 NOTE — Progress Notes (Signed)
Assessment   Post ERCP Pancreatitis-slowly improving; lipase normal; WBC down some.    Cholelithiasis with choledocholithiasis s/p laparoscopic cholecystectomy with IOC 10/28/16 and ERCP 10/29/16   Plan:  Continue IVF.  Liquid diet.   LOS: 2 days     3 Days Post-Op  Subjective: Still having waves of epigastric pain but not as intense or frequent.  On a liquid diet.  Objective: Vital signs in last 24 hours: Temp:  [97.9 F (36.6 C)-98.4 F (36.9 C)] 97.9 F (36.6 C) (02/19 0515) Pulse Rate:  [87-96] 93 (02/19 0515) Resp:  [18] 18 (02/19 0515) BP: (116-130)/(61-69) 130/69 (02/19 0515) SpO2:  [96 %-97 %] 96 % (02/19 0515) Last BM Date: 10/28/16 (pt stated)  Intake/Output from previous day: 02/18 0701 - 02/19 0700 In: 3355 [P.O.:600; I.V.:2755] Out: 400 [Urine:400] Intake/Output this shift: No intake/output data recorded.  PE: General- In NAD Abdomen-soft, mild epigastric tenderness, incisions are clean and intact  Lab Results:   Recent Labs  10/31/16 0528 11/01/16 0437  WBC 18.6* 16.1*  HGB 12.3 10.7*  HCT 38.1 32.8*  PLT 160 144*   BMET  Recent Labs  10/31/16 0528 11/01/16 0437  NA 140 136  K 4.0 4.0  CL 105 103  CO2 29 28  GLUCOSE 101* 87  BUN 14 15  CREATININE 0.92 0.90  CALCIUM 8.5* 8.6*   PT/INR No results for input(s): LABPROT, INR in the last 72 hours. Comprehensive Metabolic Panel:    Component Value Date/Time   NA 136 11/01/2016 0437   NA 140 10/31/2016 0528   K 4.0 11/01/2016 0437   K 4.0 10/31/2016 0528   CL 103 11/01/2016 0437   CL 105 10/31/2016 0528   CO2 28 11/01/2016 0437   CO2 29 10/31/2016 0528   BUN 15 11/01/2016 0437   BUN 14 10/31/2016 0528   CREATININE 0.90 11/01/2016 0437   CREATININE 0.92 10/31/2016 0528   GLUCOSE 87 11/01/2016 0437   GLUCOSE 101 (H) 10/31/2016 0528   CALCIUM 8.6 (L) 11/01/2016 0437   CALCIUM 8.5 (L) 10/31/2016 0528   AST 51 (H) 10/31/2016 0528   AST 89 (H) 10/30/2016 0518   ALT 134 (H)  10/31/2016 0528   ALT 190 (H) 10/30/2016 0518   ALKPHOS 61 10/31/2016 0528   ALKPHOS 71 10/30/2016 0518   BILITOT 1.0 10/31/2016 0528   BILITOT 0.9 10/30/2016 0518   PROT 6.1 (L) 10/31/2016 0528   PROT 6.2 (L) 10/30/2016 0518   ALBUMIN 3.3 (L) 10/31/2016 0528   ALBUMIN 3.5 10/30/2016 0518     Studies/Results: No results found.  Anti-infectives: Anti-infectives    Start     Dose/Rate Route Frequency Ordered Stop   10/29/16 1015  ciprofloxacin (CIPRO) IVPB 400 mg     400 mg 200 mL/hr over 60 Minutes Intravenous  Once 10/29/16 1004 10/29/16 1020   10/28/16 1130  ciprofloxacin (CIPRO) IVPB 400 mg     400 mg 200 mL/hr over 60 Minutes Intravenous Every 12 hours 10/28/16 1046 10/28/16 1321   10/28/16 0531  ciprofloxacin (CIPRO) IVPB 400 mg     400 mg 200 mL/hr over 60 Minutes Intravenous On call to O.R. 10/28/16 0531 10/28/16 0731       Gavin Faivre J 11/01/2016

## 2016-11-01 NOTE — Progress Notes (Signed)
     [facility] Progress Note  Chief Complaint:   pancreatitis  Subjective: Generalized abdominal pain recurs when pain meds wear off about every 3 hours. Tolerating full liquids  Objective:  Vital signs in last 24 hours: Temp:  [97.9 F (36.6 C)-98.4 F (36.9 C)] 97.9 F (36.6 C) (02/19 0515) Pulse Rate:  [87-96] 93 (02/19 0515) Resp:  [18] 18 (02/19 0515) BP: (116-130)/(61-69) 130/69 (02/19 0515) SpO2:  [96 %-97 %] 96 % (02/19 0515) Last BM Date: 10/28/16 (pt stated) General:   Alert, obese black female in NAD EENT:  Normal hearing, non icteric sclera, conjunctive pink.  Heart:  Regular rate and rhythm; no murmurs. No lower extremity edema Pulm: Normal respiratory effort, lungs CTA bilaterally without wheezes or crackles. Abdomen:  Soft, nondistended, mild epigastric tenderness. A few bowel sounds    Neurologic:  Alert and  oriented x4;  grossly normal neurologically. Psych:  Pleasanat, alert and cooperative. Normal mood and affect.   Intake/Output from previous day: 02/18 0701 - 02/19 0700 In: 3355 [P.O.:600; I.V.:2755] Out: 400 [Urine:400] Intake/Output this shift: No intake/output data recorded.  Lab Results:  Recent Labs  10/30/16 0518 10/31/16 0528 11/01/16 0437  WBC 12.9* 18.6* 16.1*  HGB 12.4 12.3 10.7*  HCT 38.2 38.1 32.8*  PLT 181 160 144*   BMET  Recent Labs  10/30/16 0518 10/31/16 0528 11/01/16 0437  NA 143 140 136  K 4.2 4.0 4.0  CL 109 105 103  CO2 '28 29 28  '$ GLUCOSE 145* 101* 87  BUN '15 14 15  '$ CREATININE 0.89 0.92 0.90  CALCIUM 8.8* 8.5* 8.6*   LFT  Recent Labs  10/31/16 0528  PROT 6.1*  ALBUMIN 3.3*  AST 51*  ALT 134*  ALKPHOS 61  BILITOT 1.0    Assessment / Plan:  1. 66 yo female with cholelithiasis, s/p lap cholecystectomy with IOC 10/28/16.  2. Choledocholithiasis, s/p ERCP with stone extraction and sphincterotomy 10/29/16.   3. Post ERCP pancreatitis. She gets generalized abdominal pain as pain meds  wear off. Tolerating full liquids without any increase in pain level. Her WBC slightly better today 18.6 >>>16.1. No fevers. Since taking PO will cut back on IVF from 150 ml / hr to 100/hr as net fluid balance is +6700.  -am CBC Principal Problem:   Post ERCP Pancreatitis Active Problems:   Obesity   Chronic cholecystitis with calculus s/p lap cholecystectomy 10/28/2016   Choledocholithiasis s/p ERCP 10/29/2016   Asthma   GERD (gastroesophageal reflux disease)    LOS: 2 days   Tye Savoy NP 11/01/2016, 8:52 AM  Pager number [phone]

## 2016-11-02 DIAGNOSIS — R110 Nausea: Secondary | ICD-10-CM

## 2016-11-02 DIAGNOSIS — K859 Acute pancreatitis without necrosis or infection, unspecified: Secondary | ICD-10-CM

## 2016-11-02 LAB — CBC
HCT: 32.3 % — ABNORMAL LOW (ref 36.0–46.0)
HCT: 32.5 % — ABNORMAL LOW (ref 36.0–46.0)
Hemoglobin: 10.6 g/dL — ABNORMAL LOW (ref 12.0–15.0)
Hemoglobin: 10.7 g/dL — ABNORMAL LOW (ref 12.0–15.0)
MCH: 28 pg (ref 26.0–34.0)
MCH: 28.8 pg (ref 26.0–34.0)
MCHC: 32.8 g/dL (ref 30.0–36.0)
MCHC: 32.9 g/dL (ref 30.0–36.0)
MCV: 85.4 fL (ref 78.0–100.0)
MCV: 87.6 fL (ref 78.0–100.0)
Platelets: 157 10*3/uL (ref 150–400)
Platelets: 160 10*3/uL (ref 150–400)
RBC: 3.71 MIL/uL — ABNORMAL LOW (ref 3.87–5.11)
RBC: 3.78 MIL/uL — ABNORMAL LOW (ref 3.87–5.11)
RDW: 13.5 % (ref 11.5–15.5)
RDW: 13.5 % (ref 11.5–15.5)
WBC: 13.7 10*3/uL — ABNORMAL HIGH (ref 4.0–10.5)
WBC: 14.8 10*3/uL — ABNORMAL HIGH (ref 4.0–10.5)

## 2016-11-02 MED ORDER — BISACODYL 10 MG RE SUPP
10.0000 mg | Freq: Once | RECTAL | Status: AC
  Administered 2016-11-02: 10 mg via RECTAL
  Filled 2016-11-02: qty 1

## 2016-11-02 MED ORDER — CODEINE SULFATE 15 MG PO TABS: 15.0000 mg | ORAL_TABLET | ORAL | Status: DC | PRN

## 2016-11-02 MED ORDER — CODEINE SULFATE 30 MG PO TABS
30.0000 mg | ORAL_TABLET | ORAL | Status: DC | PRN
  Administered 2016-11-03 – 2016-11-04 (×3): 60 mg via ORAL
  Filled 2016-11-02 (×2): qty 2
  Filled 2016-11-02: qty 1
  Filled 2016-11-02: qty 2

## 2016-11-02 MED ORDER — POLYETHYLENE GLYCOL 3350 17 G PO PACK
17.0000 g | PACK | Freq: Two times a day (BID) | ORAL | Status: AC
  Administered 2016-11-03: 17 g via ORAL
  Filled 2016-11-02: qty 1

## 2016-11-02 MED ORDER — FUROSEMIDE 10 MG/ML IJ SOLN
20.0000 mg | Freq: Once | INTRAMUSCULAR | Status: AC
  Administered 2016-11-02: 20 mg via INTRAVENOUS
  Filled 2016-11-02: qty 2

## 2016-11-02 NOTE — Progress Notes (Signed)
[facility] Progress Note  Chief Complaint: Post ERCP pancreatitis  Subjective  History:  Sarah Lee continues to have upper abdominal pain. She says it comes and goes, but has not improved since yesterday. His not seem worse when she eats. Thank reticulocyte enzymes were started yesterday. There's been no fever, and she has not had a bowel movement in at least several days. She has been out of bed walking the halls with physical therapy.  ROS: Cardiovascular:  no chest pain Respiratory: no dyspnea  Objective:  Med list reviewed  Vital signs in last 24 hrs: Vitals:   11/01/16 2117 11/02/16 0440  BP: 138/70 126/70  Pulse: 94 89  Resp: 20 20  Temp: 98.4 F (36.9 C) 98.2 F (36.8 C)    Physical Exam   HEENT: sclera anicteric, oral mucosa moist without lesions  Neck: supple, no thyromegaly, JVD or lymphadenopathy  Cardiac: RRR without murmurs, S1S2 heard, no peripheral edema  Pulm: clear to auscultation bilaterally, normal RR and effort noted  Abdomen: soft, Mild upper abdominal tenderness, with active bowel sounds. No guarding or palpable hepatosplenomegaly  Skin; warm and dry, no jaundice or rash  Recent Labs:   Recent Labs Lab 11/01/16 0437 11/02/16 0000 11/02/16 0445  WBC 16.1* 14.8* 13.7*  HGB 10.7* 10.6* 10.7*  HCT 32.8* 32.3* 32.5*  PLT 144* 157 160    Recent Labs Lab 10/31/16 0528 11/01/16 0437  NA 140 136  K 4.0 4.0  CL 105 103  CO2 29 28  BUN 14 15  ALBUMIN 3.3*  --   ALKPHOS 61  --   ALT 134*  --   AST 51*  --   GLUCOSE 101* 87   No results for input(s): INR in the last 168 hours.    '@ASSESSMENTPLANBEGIN'$ @ Assessment: Post ERCP pancreatitis Epigastric pain Nausea Leukocytosis from pancreatitis which is slowly improving  I do not think she needs a CT at this point, because I doubt the findings will change our current management. I agree with the Lasix ordered by surgery due to volume overload.  Continue current for liquid diet with  pancreatic enzyme supplements Judicious use of pain medication Dulcolax suppository ordered for today   Nelida Meuse III Pager [phone] Mon-Fri 8a-5p [phone] after 5p, weekends, holidays

## 2016-11-02 NOTE — Progress Notes (Addendum)
Paged Dr Zella Richer of pt complaints of swelling in legs. Pt does not have pitting edema at this time and no evidence of fluid in the lungs upon auscultation. Pt informed nurse that she had added salt to her chicken broth. Educated pt. MD was aware of pt concerns and states will check on her in the am. Encouraged pt to continue to ambulate, and keep SCDs plugged in, and have feet elevated.

## 2016-11-02 NOTE — Progress Notes (Signed)
Assessment   Post ERCP Pancreatitis-continued slow improvement; lipase normal; WBC continues to decrease   Edema-In>>Out =>some volume overload. .    Cholelithiasis with choledocholithiasis s/p laparoscopic cholecystectomy with IOC 10/28/16 and ERCP 10/29/16   Plan:  [facility] IVF. One dose of Lasix.  Soft diet.  Repeat lab tomorrow.   LOS: 3 days     4 Days Post-Op  Subjective: Abdominal pain improving but she reports that both legs and her lower back feel swollen and tight.  Passing gas.  Objective: Vital signs in last 24 hours: Temp:  [98.2 F (36.8 C)-98.4 F (36.9 C)] 98.2 F (36.8 C) (02/20 0440) Pulse Rate:  [89-96] 89 (02/20 0440) Resp:  [18-20] 20 (02/20 0440) BP: (126-150)/(70-81) 126/70 (02/20 0440) SpO2:  [95 %-98 %] 97 % (02/20 0440) Last BM Date: 10/29/16  Intake/Output from previous day: 02/19 0701 - 02/20 0700 In: 3749.2 [P.O.:1250; I.V.:2499.2] Out: 1910 [Urine:1910] Intake/Output this shift: Total I/O In: 1100 [I.V.:1100] Out: 1300 [Urine:1300]  PE: General- In NAD.  SaO2 95-98% on room air. Lungs-soft rales in bases Abdomen-soft, mild epigastric tenderness, incisions are clean and intact Musculoskeletal-1+sacral and pretibial edema  Lab Results:   Recent Labs  11/02/16 0000 11/02/16 0445  WBC 14.8* 13.7*  HGB 10.6* 10.7*  HCT 32.3* 32.5*  PLT 157 160   BMET  Recent Labs  10/31/16 0528 11/01/16 0437  NA 140 136  K 4.0 4.0  CL 105 103  CO2 29 28  GLUCOSE 101* 87  BUN 14 15  CREATININE 0.92 0.90  CALCIUM 8.5* 8.6*   PT/INR No results for input(s): LABPROT, INR in the last 72 hours. Comprehensive Metabolic Panel:    Component Value Date/Time   NA 136 11/01/2016 0437   NA 140 10/31/2016 0528   K 4.0 11/01/2016 0437   K 4.0 10/31/2016 0528   CL 103 11/01/2016 0437   CL 105 10/31/2016 0528   CO2 28 11/01/2016 0437   CO2 29 10/31/2016 0528   BUN 15 11/01/2016 0437   BUN 14 10/31/2016 0528   CREATININE 0.90 11/01/2016 0437   CREATININE 0.92 10/31/2016 0528   GLUCOSE 87 11/01/2016 0437   GLUCOSE 101 (H) 10/31/2016 0528   CALCIUM 8.6 (L) 11/01/2016 0437   CALCIUM 8.5 (L) 10/31/2016 0528   AST 51 (H) 10/31/2016 0528   AST 89 (H) 10/30/2016 0518   ALT 134 (H) 10/31/2016 0528   ALT 190 (H) 10/30/2016 0518   ALKPHOS 61 10/31/2016 0528   ALKPHOS 71 10/30/2016 0518   BILITOT 1.0 10/31/2016 0528   BILITOT 0.9 10/30/2016 0518   PROT 6.1 (L) 10/31/2016 0528   PROT 6.2 (L) 10/30/2016 0518   ALBUMIN 3.3 (L) 10/31/2016 0528   ALBUMIN 3.5 10/30/2016 0518     Studies/Results: No results found.  Anti-infectives: Anti-infectives    Start     Dose/Rate Route Frequency Ordered Stop   10/29/16 1015  ciprofloxacin (CIPRO) IVPB 400 mg     400 mg 200 mL/hr over 60 Minutes Intravenous  Once 10/29/16 1004 10/29/16 1020   10/28/16 1130  ciprofloxacin (CIPRO) IVPB 400 mg     400 mg 200 mL/hr over 60 Minutes Intravenous Every 12 hours 10/28/16 1046 10/28/16 1321   10/28/16 0531  ciprofloxacin (CIPRO) IVPB 400 mg     400 mg 200 mL/hr over 60 Minutes Intravenous On call to O.R. 10/28/16 0531 10/28/16 0731       Jaquaya Coyle J 11/02/2016

## 2016-11-02 NOTE — Progress Notes (Signed)
Pt wants pain meds, she is refusing oxycodone.  Wants dilaudid but it give her something of a buz.  She just had 1 gm of Tylenol.  Will try some plain codeine and see if that works for her.  If it does we could try Tylenol with Codeine for pain control.     Not sure why this requires co-sign? Alphonsa Overall, MD, [facility] Pager: [phone] Office phone:  [phone]

## 2016-11-03 LAB — COMPREHENSIVE METABOLIC PANEL
ALT: 120 U/L — ABNORMAL HIGH (ref 14–54)
AST: 49 U/L — ABNORMAL HIGH (ref 15–41)
Albumin: 3 g/dL — ABNORMAL LOW (ref 3.5–5.0)
Alkaline Phosphatase: 80 U/L (ref 38–126)
Anion gap: 8 (ref 5–15)
BUN: 10 mg/dL (ref 6–20)
CO2: 32 mmol/L (ref 22–32)
Calcium: 8.8 mg/dL — ABNORMAL LOW (ref 8.9–10.3)
Chloride: 99 mmol/L — ABNORMAL LOW (ref 101–111)
Creatinine, Ser: 0.93 mg/dL (ref 0.44–1.00)
GFR calc Af Amer: >60 mL/min (ref >60)
GFR calc non Af Amer: >60 mL/min (ref >60)
Glucose, Bld: 82 mg/dL (ref 65–99)
Potassium: 3.3 mmol/L — ABNORMAL LOW (ref 3.5–5.1)
Sodium: 139 mmol/L (ref 135–145)
Total Bilirubin: 1.8 mg/dL — ABNORMAL HIGH (ref 0.3–1.2)
Total Protein: 6.5 g/dL (ref 6.5–8.1)

## 2016-11-03 LAB — LIPASE, BLOOD: Lipase: 16 U/L (ref 11–51)

## 2016-11-03 LAB — CBC
HCT: 30.9 % — ABNORMAL LOW (ref 36.0–46.0)
Hemoglobin: 10.3 g/dL — ABNORMAL LOW (ref 12.0–15.0)
MCH: 28.3 pg (ref 26.0–34.0)
MCHC: 33.3 g/dL (ref 30.0–36.0)
MCV: 84.9 fL (ref 78.0–100.0)
Platelets: 178 10*3/uL (ref 150–400)
RBC: 3.64 MIL/uL — ABNORMAL LOW (ref 3.87–5.11)
RDW: 13.4 % (ref 11.5–15.5)
WBC: 12.8 10*3/uL — ABNORMAL HIGH (ref 4.0–10.5)

## 2016-11-03 MED ORDER — PANCRELIPASE (LIP-PROT-AMYL) 12000-38000 UNITS PO CPEP
60000.0000 [IU] | ORAL_CAPSULE | Freq: Three times a day (TID) | ORAL | Status: DC
  Administered 2016-11-03 – 2016-11-04 (×3): 60000 [IU] via ORAL
  Filled 2016-11-03 (×3): qty 5

## 2016-11-03 MED ORDER — POTASSIUM CHLORIDE CRYS ER 20 MEQ PO TBCR
40.0000 meq | EXTENDED_RELEASE_TABLET | Freq: Two times a day (BID) | ORAL | Status: AC
  Administered 2016-11-03 (×2): 40 meq via ORAL
  Filled 2016-11-03 (×2): qty 2

## 2016-11-03 MED ORDER — MAGNESIUM HYDROXIDE 400 MG/5ML PO SUSP
30.0000 mL | Freq: Every day | ORAL | Status: DC
  Administered 2016-11-03: 30 mL via ORAL
  Filled 2016-11-03 (×2): qty 30

## 2016-11-03 MED ORDER — FUROSEMIDE 10 MG/ML IJ SOLN
20.0000 mg | Freq: Once | INTRAMUSCULAR | Status: AC
  Administered 2016-11-03: 20 mg via INTRAVENOUS
  Filled 2016-11-03: qty 2

## 2016-11-03 NOTE — Progress Notes (Signed)
Assessment   Post ERCP Pancreatitis-continued slow improvement; lipase normal; WBC continues to decrease   Edema-In>>Out =>improvement in volume overload.   Hypokalemia related to diuresis .    Cholelithiasis with choledocholithiasis s/p laparoscopic cholecystectomy with IOC 10/28/16 and ERCP 10/29/16   Plan:  Replace potassium.  Milk of magnesia for constipation.  Hopefully home tomorrow.   LOS: 4 days     5 Days Post-Op  Subjective: Abdominal pain continues to improve.  Swelling better after second dose of Lasix early this morning.  Voiding a lot.  One mucousy BM after suppository yesterday.  Tolerating soft diet. Objective: Vital signs in last 24 hours: Temp:  [98.5 F (36.9 C)-98.9 F (37.2 C)] 98.9 F (37.2 C) (02/21 0640) Pulse Rate:  [83-92] 83 (02/21 0529) Resp:  [18] 18 (02/21 0529) BP: (139-158)/(64-79) 139/79 (02/21 0529) SpO2:  [96 %-100 %] 97 % (02/21 0529) Weight:  [112.2 kg (247 lb 4.8 oz)] 112.2 kg (247 lb 4.8 oz) (02/20 1150) Last BM Date: 11/02/16  Intake/Output from previous day: 02/20 0701 - 02/21 0700 In: 480 [P.O.:240; I.V.:240] Out: 4300 [Urine:4300] Intake/Output this shift: No intake/output data recorded.  PE: General- In NAD.  SaO2 95-98% on room air. Lungs-soft rales in bases Abdomen-soft, mild epigastric tenderness, incisions are clean and intact Musculoskeletal-1+sacral and pretibial edema  Lab Results:   Recent Labs  11/02/16 0445 11/03/16 0009  WBC 13.7* 12.8*  HGB 10.7* 10.3*  HCT 32.5* 30.9*  PLT 160 178   BMET  Recent Labs  11/01/16 0437 11/03/16 0547  NA 136 139  K 4.0 3.3*  CL 103 99*  CO2 28 32  GLUCOSE 87 82  BUN 15 10  CREATININE 0.90 0.93  CALCIUM 8.6* 8.8*   PT/INR No results for input(s): LABPROT, INR in the last 72 hours. Comprehensive Metabolic Panel:    Component Value Date/Time   NA 139 11/03/2016 0547   NA 136 11/01/2016 0437   K 3.3 (L) 11/03/2016 0547   K 4.0 11/01/2016 0437   CL 99 (L)  11/03/2016 0547   CL 103 11/01/2016 0437   CO2 32 11/03/2016 0547   CO2 28 11/01/2016 0437   BUN 10 11/03/2016 0547   BUN 15 11/01/2016 0437   CREATININE 0.93 11/03/2016 0547   CREATININE 0.90 11/01/2016 0437   GLUCOSE 82 11/03/2016 0547   GLUCOSE 87 11/01/2016 0437   CALCIUM 8.8 (L) 11/03/2016 0547   CALCIUM 8.6 (L) 11/01/2016 0437   AST 49 (H) 11/03/2016 0547   AST 51 (H) 10/31/2016 0528   ALT 120 (H) 11/03/2016 0547   ALT 134 (H) 10/31/2016 0528   ALKPHOS 80 11/03/2016 0547   ALKPHOS 61 10/31/2016 0528   BILITOT 1.8 (H) 11/03/2016 0547   BILITOT 1.0 10/31/2016 0528   PROT 6.5 11/03/2016 0547   PROT 6.1 (L) 10/31/2016 0528   ALBUMIN 3.0 (L) 11/03/2016 0547   ALBUMIN 3.3 (L) 10/31/2016 0528     Studies/Results: No results found.  Anti-infectives: Anti-infectives    Start     Dose/Rate Route Frequency Ordered Stop   10/29/16 1015  ciprofloxacin (CIPRO) IVPB 400 mg     400 mg 200 mL/hr over 60 Minutes Intravenous  Once 10/29/16 1004 10/29/16 1020   10/28/16 1130  ciprofloxacin (CIPRO) IVPB 400 mg     400 mg 200 mL/hr over 60 Minutes Intravenous Every 12 hours 10/28/16 1046 10/28/16 1321   10/28/16 0531  ciprofloxacin (CIPRO) IVPB 400 mg     400 mg 200 mL/hr over  60 Minutes Intravenous On call to O.R. 10/28/16 0531 10/28/16 0731       Yoseph Haile J 11/03/2016

## 2016-11-03 NOTE — Progress Notes (Signed)
  Pt c/o of abdominal fullness, pain and swelling in upper and lower extremities. Lungs sound are clear, assessed pt's extremities no swelling noted at this time. Paged Dr Hassell Done order given for 20 mg of IV Lasix and NSL fluids, Dilaudid  0.'5mg'$  IV , Reglan 10 mg IV and Maalox given for gas and fullness and pressure, will continue to encourage the Pt to walk will continue to monitor.

## 2016-11-03 NOTE — Progress Notes (Signed)
Pt had increase relief after medication, she rate her pain a 2 out of 10, she up and ambulating in the hall. I will continue to monitor.

## 2016-11-03 NOTE — Progress Notes (Signed)
     [facility] Progress Note  Chief Complaint: Post ERCP pancreatitis  Subjective:  Feeling much better.  Has been on a soft diet since yesterday AM.  No pain today.  Eating small amounts of food.  Having small BM's with mucus.  Objective:  Vital signs in last 24 hours: Temp:  [98.5 F (36.9 C)-98.9 F (37.2 C)] 98.9 F (37.2 C) (02/21 0640) Pulse Rate:  [83-92] 83 (02/21 0529) Resp:  [18] 18 (02/21 0529) BP: (139-158)/(64-79) 139/79 (02/21 0529) SpO2:  [96 %-100 %] 97 % (02/21 0529) Weight:  [247 lb 4.8 oz (112.2 kg)] 247 lb 4.8 oz (112.2 kg) (02/20 1150) Last BM Date: 11/02/16 General:  Alert, Well-developed, in NAD Heart:  Regular rate and rhythm; no murmurs Pulm:  CTAB.  No W/R/R.  No increased WOB. Abdomen:  Soft, non-distended.  BS present.  Non-tender.   Extremities:  Without edema. Neurologic:  Alert and oriented x 4;  grossly normal neurologically. Psych:  Alert and cooperative. Normal mood and affect.  Intake/Output from previous day: 02/20 0701 - 02/21 0700 In: 480 [P.O.:240; I.V.:240] Out: 4300 [Urine:4300]  Lab Results:  Recent Labs  11/02/16 0000 11/02/16 0445 11/03/16 0009  WBC 14.8* 13.7* 12.8*  HGB 10.6* 10.7* 10.3*  HCT 32.3* 32.5* 30.9*  PLT 157 160 178   BMET  Recent Labs  11/01/16 0437 11/03/16 0547  NA 136 139  K 4.0 3.3*  CL 103 99*  CO2 28 32  GLUCOSE 87 82  BUN 15 10  CREATININE 0.90 0.93  CALCIUM 8.6* 8.8*   LFT  Recent Labs  11/03/16 0547  PROT 6.5  ALBUMIN 3.0*  AST 49*  ALT 120*  ALKPHOS 80  BILITOT 1.8*   Assessment / Plan: *Post ERCP pancreatitis:  Slowly improving.  Lipase normal.  Little to no pain today.  Tolerating soft food.  Will increase creon to 60,000 units TID with meals.  Should continue these for a week after discharge.   *Leukocytosis from pancreatitis which is slowly improving, 12,800 today. *Constipation:  Likely from anesthesia and pain meds:  Having small BM's with mucus.   Continue Miralax BID for now.   LOS: 4 days   ZEHR, JESSICA D.  11/03/2016, 8:54 AM  Pager number [phone]  I have discussed the case with the PA, and that is the plan I formulated. I personally interviewed and examined the patient. Add'l Dx: Choledocholithiasis, resolved after ERCP and extraction.  She looks much better today.  Eating solid food without pain or nausea. Not much BM, but has not eaten much in nearly a week.   Home tomorrow on another several days of pancreatic enzymes to rest the pancreas.  See me as needed in clinic.   Nelida Meuse III Pager [phone]  Mon-Fri 8a-5p [phone] after 5p, weekends, holidays

## 2016-11-03 NOTE — Discharge Instructions (Addendum)
[facility] ______[facility] LAPAROSCOPIC CHOLECYSTECTOMY SURGERY: POST OP INSTRUCTIONS Always review your discharge instruction sheet given to you by the facility where your surgery was performed. IF YOU HAVE DISABILITY OR FAMILY LEAVE FORMS, YOU MUST BRING THEM TO THE OFFICE FOR PROCESSING.   DO NOT GIVE THEM TO YOUR DOCTOR.  1. A prescription for pain medication may be given to you upon discharge.  Take your pain medication as prescribed, if needed.  If narcotic pain medicine is not needed, then you may take acetaminophen (Tylenol) or ibuprofen (Advil) as needed. 2. Take your usually prescribed medications unless otherwise directed. 3. If you need a refill on your pain medication, please contact your pharmacy.  They will contact our office to request authorization. Prescriptions will not be filled after 5pm or on week-ends. 4. You should follow a strict lowfat diet.  Do not overeat. 5. It is common to experience some constipation if taking pain medication after surgery.  Increasing fluid intake and taking a stool softener (such as Colace) will usually help or prevent this problem from occurring.  A mild laxative (Milk of Magnesia or Miralax) should be taken according to package instructions if there are no bowel movements after 48 hours. 6. Unless discharge instructions indicate otherwise, you may remove your bandages 72 hours after surgery.  You may shower the day after surgery.  You may have steri-strips (small skin tapes) in place directly over the incision.  These strips should be left on the skin until they fall off.  If your surgeon used skin glue on the incision, you may shower in 24 hours.  The glue will flake off over the next 2-3 weeks.  Any sutures or staples will be removed at the office during your follow-up visit. 7. ACTIVITIES:  You may resume regular (light) daily activities beginning the next day--such as daily self-care, walking, climbing stairs--gradually increasing  activities as tolerated.  You may have sexual intercourse when it is comfortable.  Refrain from any heavy lifting or straining for two weeks.  Do not lift anything over 10 pounds during that time.  a. You may drive when you are no longer taking prescription pain medication, you can comfortably wear a seatbelt, and you can safely maneuver your car and apply brakes. b. RETURN TO WORK:  Desk type work in 1 week, full duty work in 2 weeks if you are pain-free.________________________________________________________ 8. You should see your doctor in the office for a follow-up appointment approximately 2-3 weeks after your surgery.  Make sure that you call for this appointment within a day or two after you arrive home to insure a convenient appointment time. 9. OTHER INSTRUCTIONS: __Take Creon pills before your meals.________________________________________________________________________________________________________________________ __________________________________________________________________________________________________________________________ WHEN TO CALL YOUR DOCTOR: 1. Fever over 101.0 2. Inability to urinate 3. Continued bleeding from incision. 4. Increased pain, redness, or drainage from the incision. 5. Increasing abdominal pain  The clinic staff is available to answer your questions during regular business hours.  Please dont hesitate to call and ask to speak to one of the nurses for clinical concerns.  If you have a medical emergency, go to the nearest emergency room or call 911.  A surgeon from [facility] is always on call at the hospital. [address] [place], [place], [place]  [address] ? [address], [place], [place]   [address] [phone] ? [phone] ? FAX [phone] Web site: [email or web address]

## 2016-11-04 ENCOUNTER — Encounter (HOSPITAL_COMMUNITY): Payer: Self-pay | Admitting: Student

## 2016-11-04 MED ORDER — PANCRELIPASE (LIP-PROT-AMYL) 20000-68000 UNITS PO CPEP: 60000.0000 [IU] | ORAL_CAPSULE | Freq: Three times a day (TID) | ORAL | 0 refills | Status: DC

## 2016-11-04 MED ORDER — CODEINE SULFATE 30 MG PO TABS: 30.0000 mg | ORAL_TABLET | ORAL | 0 refills | Status: DC | PRN

## 2016-11-04 NOTE — Progress Notes (Signed)
[facility] Progress Note  Chief Complaint: pancreatitis  Subjective  History:  Feeling well.  Abd pain decreased No vomiting Eager to go home today  ROS: Cardiovascular:  no chest pain Respiratory: no dyspnea  Objective:  Med list reviewed  Vital signs in last 24 hrs: Vitals:   11/03/16 2112 11/04/16 0616  BP: (!) 145/74 134/71  Pulse: 76 84  Resp: 18 18  Temp: 98.5 F (36.9 C) 99 F (37.2 C)    Physical Exam   HEENT: sclera anicteric, oral mucosa moist without lesions  Neck: supple, no thyromegaly, JVD or lymphadenopathy  Cardiac: RRR without murmurs, S1S2 heard, no peripheral edema  Pulm: clear to auscultation bilaterally, normal RR and effort noted  Abdomen: soft, no tenderness, with active bowel sounds. No guarding or palpable hepatosplenomegaly  Skin; warm and dry, no jaundice or rash  Recent Labs:   Recent Labs Lab 11/02/16 0000 11/02/16 0445 11/03/16 0009  WBC 14.8* 13.7* 12.8*  HGB 10.6* 10.7* 10.3*  HCT 32.3* 32.5* 30.9*  PLT 157 160 178    Recent Labs Lab 11/03/16 0547  NA 139  K 3.3*  CL 99*  CO2 32  BUN 10  ALBUMIN 3.0*  ALKPHOS 80  ALT 120*  AST 49*  GLUCOSE 82   No results for input(s): INR in the last 168 hours.  '@ASSESSMENTPLANBEGIN'$ @ Assessment: Post-ERCP pancreatitis - improved Choledocholithiasis, resolved after ERCP    Plan: Home today I have made arrangements for her to pick up Creon samples at my office to last for the next 5-7 days See me as needed.  Nelida Meuse III Pager [phone] Mon-Fri 8a-5p [phone] after 5p, weekends, holidays

## 2016-11-04 NOTE — Discharge Summary (Signed)
Physician Discharge Summary  Patient ID: Sarah Lee MRN: [id number] DOB/AGE: [birth year] 66 y.o.  Admit date: 10/28/2016 Discharge date: 11/04/2016  Admission Diagnoses:  Symptomatic cholelithiasis  Discharge Diagnoses:  Principal Problem:   Post ERCP Pancreatitis Active Problems:   Obesity   Chronic cholecystitis with calculus s/p lap cholecystectomy 10/28/2016   Choledocholithiasis s/p ERCP 10/29/2016   Asthma   GERD (gastroesophageal reflux disease)   Discharged Condition: fair  Hospital Course: she underwent laparoscopic cholecystectomy October 28, 2016.  Cholangiogram was done and demonstrated choledocholithiasis.  Gastroenterology consultation was obtained. The following day she underwent an ERCP in the morning.  Later in the afternoon she began developing some moderate epigastric pain which was worse next morning.  Lipase was significantly elevated  some elevation of liver function tests.  Clinical and laboratory results were consistent with  post-ERCP pancreatitis.  She was started on bowel rest and aggressive hydration with intravenous narcotics for pain control.  Her symptoms  very slowly improved and lipase returning into normal range, white blood cell count trending down towards normal, and liver functions improving. She was started on a clear liquid diet as well as Creon. Her diet was slowly advanced and she was transitioned to oral analgesics.  By POD #7 she was ready for discharge.  Discharge instructions were given to her.  Consults: GI-Dr. Loletha Carrow   Discharge Exam: Blood pressure (!) 158/85, pulse 87, temperature 98.3 F (36.8 C), temperature source Oral, resp. rate 18, height '5\' 4"'$  (1.626 m), weight 110.6 kg (243 lb 14.4 oz), SpO2 98 %.   Disposition: 01-Home or Self Care   Allergies as of 11/04/2016      Reactions   Ibuprofen    Iron Other (See Comments)   Extreme constipation   Penicillins Rash   Tetracyclines & Related Nausea And Vomiting, Anxiety       Medication List    TAKE these medications   albuterol 108 (90 Base) MCG/ACT inhaler Commonly known as:  PROVENTIL HFA;VENTOLIN HFA Inhale 1-2 puffs into the lungs every 4 (four) hours as needed for wheezing or shortness of breath.   codeine 30 MG tablet Take 1-2 tablets (30-60 mg total) by mouth every 4 (four) hours as needed for moderate pain.   montelukast 10 MG tablet Commonly known as:  SINGULAIR Take 10 mg by mouth daily as needed.   Pancrelipase (Lip-Prot-Amyl) 20000 units Cpep Take 3 capsules (60,000 Units total) by mouth 3 (three) times daily before meals.        Signed: Odis Hollingshead 11/04/2016, 2:52 PM

## 2016-11-04 NOTE — Progress Notes (Signed)
Pt's vitals are WNL, tolerating diet and pain is under control. Discussed discharge instructions with patient. Discharged to home with prescriptions.

## 2016-11-04 NOTE — Progress Notes (Signed)
Assessment   Post ERCP Pancreatitis-continue to improve .    Cholelithiasis with choledocholithiasis s/p laparoscopic cholecystectomy with IOC 10/28/16 and ERCP 10/29/16   Plan:   Discharge today. Codeine for pain.  Creon before meals.  RTC in about 2 weeks.  LOS: 5 days     6 Days Post-Op  Subjective: She is feeling better overall and is eager to go home.  Objective: Vital signs in last 24 hours: Temp:  [98.5 F (36.9 C)-99 F (37.2 C)] 99 F (37.2 C) (02/22 0616) Pulse Rate:  [76-84] 84 (02/22 0616) Resp:  [18-19] 18 (02/22 0616) BP: (134-154)/(71-77) 134/71 (02/22 0616) SpO2:  [96 %-100 %] 96 % (02/22 0616) Weight:  [110.6 kg (243 lb 14.4 oz)] 110.6 kg (243 lb 14.4 oz) (02/22 0616) Last BM Date: 11/02/16  Intake/Output from previous day: 02/21 0701 - 02/22 0700 In: 720 [P.O.:720] Out: 3850 [Urine:3850] Intake/Output this shift: No intake/output data recorded.  PE: General- In NAD.  Browsing Facebook on phone. Abdomen-soft, no epigastric tenderness, incisions are clean and intact   Lab Results:   Recent Labs  11/02/16 0445 11/03/16 0009  WBC 13.7* 12.8*  HGB 10.7* 10.3*  HCT 32.5* 30.9*  PLT 160 178   BMET  Recent Labs  11/03/16 0547  NA 139  K 3.3*  CL 99*  CO2 32  GLUCOSE 82  BUN 10  CREATININE 0.93  CALCIUM 8.8*   PT/INR No results for input(s): LABPROT, INR in the last 72 hours. Comprehensive Metabolic Panel:    Component Value Date/Time   NA 139 11/03/2016 0547   NA 136 11/01/2016 0437   K 3.3 (L) 11/03/2016 0547   K 4.0 11/01/2016 0437   CL 99 (L) 11/03/2016 0547   CL 103 11/01/2016 0437   CO2 32 11/03/2016 0547   CO2 28 11/01/2016 0437   BUN 10 11/03/2016 0547   BUN 15 11/01/2016 0437   CREATININE 0.93 11/03/2016 0547   CREATININE 0.90 11/01/2016 0437   GLUCOSE 82 11/03/2016 0547   GLUCOSE 87 11/01/2016 0437   CALCIUM 8.8 (L) 11/03/2016 0547   CALCIUM 8.6 (L) 11/01/2016 0437   AST 49 (H) 11/03/2016 0547   AST 51 (H)  10/31/2016 0528   ALT 120 (H) 11/03/2016 0547   ALT 134 (H) 10/31/2016 0528   ALKPHOS 80 11/03/2016 0547   ALKPHOS 61 10/31/2016 0528   BILITOT 1.8 (H) 11/03/2016 0547   BILITOT 1.0 10/31/2016 0528   PROT 6.5 11/03/2016 0547   PROT 6.1 (L) 10/31/2016 0528   ALBUMIN 3.0 (L) 11/03/2016 0547   ALBUMIN 3.3 (L) 10/31/2016 0528     Studies/Results: No results found.  Anti-infectives: Anti-infectives    Start     Dose/Rate Route Frequency Ordered Stop   10/29/16 1015  ciprofloxacin (CIPRO) IVPB 400 mg     400 mg 200 mL/hr over 60 Minutes Intravenous  Once 10/29/16 1004 10/29/16 1020   10/28/16 1130  ciprofloxacin (CIPRO) IVPB 400 mg     400 mg 200 mL/hr over 60 Minutes Intravenous Every 12 hours 10/28/16 1046 10/28/16 1321   10/28/16 0531  ciprofloxacin (CIPRO) IVPB 400 mg     400 mg 200 mL/hr over 60 Minutes Intravenous On call to O.R. 10/28/16 0531 10/28/16 0731       Jamia Hoban J 11/04/2016

## 2016-11-15 DIAGNOSIS — D649 Anemia, unspecified: Secondary | ICD-10-CM | POA: Diagnosis not present

## 2016-11-15 DIAGNOSIS — R5383 Other fatigue: Secondary | ICD-10-CM | POA: Diagnosis not present

## 2016-11-18 DIAGNOSIS — K8020 Calculus of gallbladder without cholecystitis without obstruction: Secondary | ICD-10-CM | POA: Diagnosis not present

## 2016-11-18 DIAGNOSIS — Z6837 Body mass index (BMI) 37.0-37.9, adult: Secondary | ICD-10-CM | POA: Diagnosis not present

## 2016-11-18 DIAGNOSIS — E559 Vitamin D deficiency, unspecified: Secondary | ICD-10-CM | POA: Diagnosis not present

## 2016-11-18 DIAGNOSIS — R945 Abnormal results of liver function studies: Secondary | ICD-10-CM | POA: Diagnosis not present

## 2016-11-30 ENCOUNTER — Other Ambulatory Visit (INDEPENDENT_AMBULATORY_CARE_PROVIDER_SITE_OTHER): Payer: PPO

## 2016-11-30 ENCOUNTER — Encounter (INDEPENDENT_AMBULATORY_CARE_PROVIDER_SITE_OTHER): Payer: Self-pay

## 2016-11-30 ENCOUNTER — Ambulatory Visit (INDEPENDENT_AMBULATORY_CARE_PROVIDER_SITE_OTHER): Payer: PPO | Admitting: Physician Assistant

## 2016-11-30 ENCOUNTER — Encounter: Payer: Self-pay | Admitting: Physician Assistant

## 2016-11-30 VITALS — BP 112/74 | HR 76 | Ht 64.0 in | Wt 222.2 lb

## 2016-11-30 DIAGNOSIS — R7989 Other specified abnormal findings of blood chemistry: Secondary | ICD-10-CM

## 2016-11-30 DIAGNOSIS — R945 Abnormal results of liver function studies: Principal | ICD-10-CM

## 2016-11-30 DIAGNOSIS — K5900 Constipation, unspecified: Secondary | ICD-10-CM

## 2016-11-30 DIAGNOSIS — Z9049 Acquired absence of other specified parts of digestive tract: Secondary | ICD-10-CM

## 2016-11-30 LAB — HEPATIC FUNCTION PANEL
ALT: 31 U/L (ref 0–35)
AST: 22 U/L (ref 0–37)
Albumin: 4 g/dL (ref 3.5–5.2)
Alkaline Phosphatase: 81 U/L (ref 39–117)
Bilirubin, Direct: 0.1 mg/dL (ref 0.0–0.3)
Total Bilirubin: 0.5 mg/dL (ref 0.2–1.2)
Total Protein: 7.3 g/dL (ref 6.0–8.3)

## 2016-11-30 NOTE — Patient Instructions (Signed)
Your physician has requested that you go to the basement for lab work before leaving today.  We have given you a high fiber diet handout. Please strive to have 25-30 grams of fiber daily.   Your provider suggest that you drink more water. Try to have at least 6-8 8 oz glasses of water daily.   Try to exercise at least 30 minutes a day.   Please purchase the following medications over the counter and take as directed: Align probiotic once daily

## 2016-11-30 NOTE — Progress Notes (Signed)
Thank you for sending this case to me. I have reviewed the entire note, and the outlined plan seems appropriate.  LFTs from earlier today have returned normal.  Wilfrid Lund, MD

## 2016-11-30 NOTE — Progress Notes (Signed)
Chief Complaint: Constipation, elevated LFTs  HPI:  Sarah Lee is a 66 year old female with a past medical history of anemia, constipation, gallstones, GERD and recent cholecystectomy as well as ERCP and post-ERCP pancreatitis, who was referred to me by Fanny Bien, MD for a complaint of continually elevated LFTs and constipation .      Patient was recently admitted to the hospital 10/28/16-11/04/16 for symptomatic cholelithiasis. She had a cholecystectomy and an ERCP for choledocholithiasis performed by Dr. Loletha Carrow. At discharge her white count was minimally elevated at 12.8 and CMP showed continually elevated liver enzymes with an AST of 49 and ALT of 120. Bilirubin was 1.8 at that time. Labs were recently checked by PCP 11/15/16 and white count has returned to normal at 6.3. CMP showed elevated AST at 70 and ALT of 183. Alkaline phosphatase was normal at 89 and total bilirubin normal at 0.5.   The patient tells me today that she did have some epigastric burning pain and left lower quadrant abdominal pain for a couple of weeks after leaving the hospital, but over the past week this has all gotten much better. Her primary complaint today is that she has constipation. Apparently this is chronic for her and she has a bowel movement "maybe every other day", but sometimes this is just "small hard balls". Patient has tried various over-the-counter laxatives in the past with no results. She is leery of starting any medication today. She does tell me she was on a probiotic while in the hospital and felt like this may be helping her.   Patient denies fever, chills, blood in her stool, melena, weight loss, fatigue and anorexia, nausea, vomiting, heartburn, reflux or symptoms that awaken her at night.  Past Medical History:  Diagnosis Date  . Allergy   . Anemia   . Asthma   . Blood transfusion   . Cataract    slight  . Complication of anesthesia    used sodium pentothal and very hard to wake up and arm  swelled up from medication  . Constipation   . Gallstones   . GERD (gastroesophageal reflux disease)   . Heart murmur   . Seasonal allergies     Past Surgical History:  Procedure Laterality Date  . ABDOMINAL HYSTERECTOMY  2004   fibroids--- TAH/BSO  . BREAST SURGERY     right breast biopsy-benign  . CESAREAN SECTION    . CHOLECYSTECTOMY N/A 10/28/2016   Procedure: LAPAROSCOPIC CHOLECYSTECTOMY WITH INTRAOPERATIVE CHOLANGIOGRAM;  Surgeon: Jackolyn Confer, MD;  Location: [facility];  Service: General;  Laterality: N/A;  . ERCP N/A 10/29/2016   Procedure: ENDOSCOPIC RETROGRADE CHOLANGIOPANCREATOGRAPHY (ERCP);  Surgeon: Doran Stabler, MD;  Location: [facility];  Service: Endoscopy;  Laterality: N/A;  . UTERINE FIBROID SURGERY      Current Outpatient Prescriptions  Medication Sig Dispense Refill  . albuterol (PROVENTIL HFA;VENTOLIN HFA) 108 (90 Base) MCG/ACT inhaler Inhale 1-2 puffs into the lungs every 4 (four) hours as needed for wheezing or shortness of breath.    . cholecalciferol (VITAMIN D) 1000 units tablet Take 1,000 Units by mouth daily.    . montelukast (SINGULAIR) 10 MG tablet Take 10 mg by mouth daily as needed.    . lipase/protease/amylase 20000 units CPEP Take 3 capsules (60,000 Units total) by mouth 3 (three) times daily before meals. (Patient not taking: Reported on 11/30/2016) 70 capsule 0   No current facility-administered medications for this visit.     Allergies as of 11/30/2016 - Review  Complete 11/30/2016  Allergen Reaction Noted  . Ibuprofen  11/01/2016  . Iron Other (See Comments) 05/20/2011  . Penicillins Rash 05/20/2011  . Tetracyclines & related Nausea And Vomiting and Anxiety 05/20/2011    Family History  Problem Relation Age of Onset  . Cancer Father     prostate  . Anemia Mother   . Stomach cancer Cousin     maternal side   . Stomach cancer Maternal Uncle   . Colon cancer Neg Hx   . Esophageal cancer Neg Hx   . Rectal cancer Neg Hx      Social History   Social History  . Marital status: Married    Spouse name: N/A  . Number of children: N/A  . Years of education: N/A   Occupational History  .      unemployed   Social History Main Topics  . Smoking status: Former Smoker    Packs/day: 1.50    Years: 25.00    Types: Cigarettes    Quit date: 03/02/1988  . Smokeless tobacco: Never Used  . Alcohol use Yes     Comment: wine occassionally  . Drug use: Yes    Types: Methaqualone  . Sexual activity: Not Currently   Other Topics Concern  . Not on file   Social History Narrative   Exercise--  no    Review of Systems:    Constitutional: No weight loss, fever, chills, weakness or fatigue Skin: No rash  Cardiovascular: No chest pain Respiratory: No SOB  Gastrointestinal: See HPI and otherwise negative Genitourinary: No dysuria or change in urinary frequency Neurological: No headache Musculoskeletal: No new muscle or joint pain Hematologic: No bleeding or bruising Psychiatric: No history of depression or anxiety   Physical Exam:  Vital signs: BP 112/74   Pulse 76   Ht '5\' 4"'$  (1.626 m)   Wt 222 lb 4 oz (100.8 kg)   BMI 38.15 kg/m   Constitutional:   Pleasant obese Caucasian female appears to be in NAD, Well developed, Well nourished, alert and cooperative Head:  Normocephalic and atraumatic. Eyes:   PEERL, EOMI. No icterus. Conjunctiva pink. Ears:  Normal auditory acuity. Neck:  Supple Throat: Oral cavity and pharynx without inflammation, swelling or lesion.  Respiratory: Respirations even and unlabored. Lungs clear to auscultation bilaterally.   No wheezes, crackles, or rhonchi.  Cardiovascular: Normal S1, S2. No MRG. Regular rate and rhythm. No peripheral edema, cyanosis or pallor.  Gastrointestinal:  Soft, nondistended, nontender. No rebound or guarding. Normal bowel sounds. No appreciable masses or hepatomegaly. Rectal:  Not performed.  Msk:  Symmetrical without gross deformities. Without edema,  no deformity or joint abnormality.  Neurologic:  Alert and  oriented x4;  grossly normal neurologically.  Skin:   Dry and intact without significant lesions or rashes. Psychiatric:  Demonstrates good judgement and reason without abnormal affect or behaviors.  RELEVANT LABS AND IMAGING: CBC    Component Value Date/Time   WBC 12.8 (H) 11/03/2016 0009   RBC 3.64 (L) 11/03/2016 0009   HGB 10.3 (L) 11/03/2016 0009   HCT 30.9 (L) 11/03/2016 0009   PLT 178 11/03/2016 0009   MCV 84.9 11/03/2016 0009   MCH 28.3 11/03/2016 0009   MCHC 33.3 11/03/2016 0009   RDW 13.4 11/03/2016 0009   LYMPHSABS 1.5 10/25/2016 1058   MONOABS 0.4 10/25/2016 1058   EOSABS 0.1 10/25/2016 1058   BASOSABS 0.0 10/25/2016 1058    CMP     Component Value Date/Time   NA  139 11/03/2016 0547   K 3.3 (L) 11/03/2016 0547   CL 99 (L) 11/03/2016 0547   CO2 32 11/03/2016 0547   GLUCOSE 82 11/03/2016 0547   BUN 10 11/03/2016 0547   CREATININE 0.93 11/03/2016 0547   CALCIUM 8.8 (L) 11/03/2016 0547   PROT 6.5 11/03/2016 0547   ALBUMIN 3.0 (L) 11/03/2016 0547   AST 49 (H) 11/03/2016 0547   ALT 120 (H) 11/03/2016 0547   ALKPHOS 80 11/03/2016 0547   BILITOT 1.8 (H) 11/03/2016 0547   GFRNONAA >60 11/03/2016 0547   GFRAA >60 11/03/2016 0547   More recent labs in HPI  Assessment: 1. Elevated LFTs: Slight increase after time of discharge, though this was 15 days ago, we will recheck today, patient with no symptoms of abdominal pain, nausea or vomiting 2. Constipation: Chronic for the patient, every other day bowel movements, no help from over-the-counter laxatives, the patient declines medication 3. Status post cholecystectomy and ERCP for choledocholithiasis 11/03/16  Plan: 1. Discussed with the patient that it is unlikely that she has repeat choledocholithiasis that she has no symptoms, we will recheck liver function tests today. As long as these continue to trend down, we will not need any further evaluation, if  still elevated could consider abdominal imaging 2. Discussed possible medications for patient's constipation including Linzess or Amitiza but she declines at this time as she "does not like taking medicine and it doesn't usually work". 3. Recommend patient maintain a high-fiber diet 25-35 g daily with use of a fiber supplement such as Metamucil, Citrucel or Benefiber. Recommend she increase her daily water intake to at least 6-8 8 ounce glasses of water per day. She should also exercise at least 30 minutes a day. 4. Discussed Align probiotic on a daily basis. 5. If all of the above measures do not work then could try MiraLAX, though the patient tells me this has not worked for her in the past, if she becomes frustrated with this, could also re-discuss above meds 6. Patient to follow in clinic as directed by lab results with Dr. Loletha Carrow or myself  Ellouise Newer, PA-C [facility] 11/30/2016, 9:57 AM  Cc: Fanny Bien, MD

## 2016-12-02 DIAGNOSIS — R7989 Other specified abnormal findings of blood chemistry: Secondary | ICD-10-CM | POA: Diagnosis not present

## 2016-12-02 DIAGNOSIS — R5383 Other fatigue: Secondary | ICD-10-CM | POA: Diagnosis not present

## 2016-12-02 DIAGNOSIS — R945 Abnormal results of liver function studies: Secondary | ICD-10-CM | POA: Diagnosis not present

## 2016-12-06 DIAGNOSIS — Z6838 Body mass index (BMI) 38.0-38.9, adult: Secondary | ICD-10-CM | POA: Diagnosis not present

## 2016-12-06 DIAGNOSIS — K5900 Constipation, unspecified: Secondary | ICD-10-CM | POA: Diagnosis not present

## 2016-12-06 DIAGNOSIS — E559 Vitamin D deficiency, unspecified: Secondary | ICD-10-CM | POA: Diagnosis not present

## 2016-12-06 DIAGNOSIS — R945 Abnormal results of liver function studies: Secondary | ICD-10-CM | POA: Diagnosis not present

## 2016-12-21 DIAGNOSIS — K858 Other acute pancreatitis without necrosis or infection: Secondary | ICD-10-CM | POA: Diagnosis not present

## 2017-02-11 NOTE — Addendum Note (Signed)
Addendum  created 02/11/17 1126 by Rica Koyanagi, MD   Sign clinical note

## 2017-02-23 DIAGNOSIS — K5900 Constipation, unspecified: Secondary | ICD-10-CM | POA: Diagnosis not present

## 2017-02-23 DIAGNOSIS — M546 Pain in thoracic spine: Secondary | ICD-10-CM | POA: Diagnosis not present

## 2017-02-23 DIAGNOSIS — Z6836 Body mass index (BMI) 36.0-36.9, adult: Secondary | ICD-10-CM | POA: Diagnosis not present

## 2017-02-23 DIAGNOSIS — J309 Allergic rhinitis, unspecified: Secondary | ICD-10-CM | POA: Diagnosis not present

## 2017-03-01 ENCOUNTER — Ambulatory Visit: Payer: BLUE CROSS/BLUE SHIELD | Attending: Family Medicine | Admitting: Physical Therapy

## 2017-03-01 DIAGNOSIS — M545 Low back pain, unspecified: Secondary | ICD-10-CM

## 2017-03-01 DIAGNOSIS — M6281 Muscle weakness (generalized): Secondary | ICD-10-CM | POA: Insufficient documentation

## 2017-03-01 DIAGNOSIS — G8929 Other chronic pain: Secondary | ICD-10-CM | POA: Diagnosis not present

## 2017-03-01 NOTE — Patient Instructions (Signed)
    HAMSTRING STRETCH WITH TOWEL  While lying down on your back, hook a towel or strap under  your foot and draw up your leg until a stretch is felt under your leg. calf area.  Keep your knee in a straightened position during the stretch.  Hold 30 seconds, perform 3 repetitions.  1-2 times a day.        [facility] [address] [place], [place] [address] Phone # [phone] Fax [phone]       [facility] [address] [place], [place] [address] Phone # [phone] Fax [phone]

## 2017-03-01 NOTE — Therapy (Signed)
[facility] [address] [place], [place], [address] Phone: [phone]   Fax:  [phone]  Physical Therapy Evaluation  Patient Details  Name: Sarah Lee MRN: [id number] Date of Birth: [birth year] Referring Provider: Dr. Ernie Hew  Encounter Date: 03/01/2017      PT End of Session - 03/01/17 1741    Visit Number 1   Date for PT Re-Evaluation 04/26/17   Authorization Type  G codes;  KX at visit 14   PT Start Time 0948  pt late   PT Stop Time 1030   PT Time Calculation (min) 42 min   Activity Tolerance Patient tolerated treatment well      Past Medical History:  Diagnosis Date  . Allergy   . Anemia   . Asthma   . Blood transfusion   . Cataract    slight  . Complication of anesthesia    used sodium pentothal and very hard to wake up and arm swelled up from medication  . Constipation   . Gallstones   . GERD (gastroesophageal reflux disease)   . Heart murmur   . Seasonal allergies     Past Surgical History:  Procedure Laterality Date  . ABDOMINAL HYSTERECTOMY  2004   fibroids--- TAH/BSO  . BREAST SURGERY     right breast biopsy-benign  . CESAREAN SECTION    . CHOLECYSTECTOMY N/A 10/28/2016   Procedure: LAPAROSCOPIC CHOLECYSTECTOMY WITH INTRAOPERATIVE CHOLANGIOGRAM;  Surgeon: Jackolyn Confer, MD;  Location: [facility];  Service: General;  Laterality: N/A;  . ERCP N/A 10/29/2016   Procedure: ENDOSCOPIC RETROGRADE CHOLANGIOPANCREATOGRAPHY (ERCP);  Surgeon: Doran Stabler, MD;  Location: [facility];  Service: Endoscopy;  Laterality: N/A;  . UTERINE FIBROID SURGERY      There were no vitals filed for this visit.       Subjective Assessment - 03/01/17 0956    Subjective History of midback to lower back pain for 2 days;  they though it was my gallbladder but then had surgery and it's still there.    Back pain when I don't drink enough water or turn too quickly.  Dr. Ernie Hew started me on shoulder rolls and  knee to chest stretches.  Trying to lose weight and I've lost some.     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 as long as I want   How long can you stand comfortably? varies 30 min or all day   How long can you walk comfortably? 15-30 min   Diagnostic tests xrays at chiropractor;  unsure if have had bone density test   Patient Stated Goals find out what's wrong with me and see if exercise helps   Currently in Pain? No/denies   Pain Score 0-No pain   Pain Location Back   Pain Orientation Lower;Mid   Pain Type Chronic pain   Pain Onset More than a month ago   Pain Frequency Intermittent   Aggravating Factors  caffeine, not drinking enough water; ice cream;  turning too quickly   Pain Relieving Factors not moving; sitting            [facility] PT Assessment - 03/01/17 0001      Assessment   Medical Diagnosis back pain   Referring Provider Dr. Ernie Hew   Onset Date/Surgical Date --  2 weeks   Next MD Visit 2 weeks  Prior Therapy yes when I had it for my neck (electronic needles) helped temporarily     Precautions   Precautions None     Restrictions   Weight Bearing Restrictions No     Balance Screen   Has the patient fallen in the past 6 months Yes   How many times? 1   Has the patient had a decrease in activity level because of a fear of falling?  No   Is the patient reluctant to leave their home because of a fear of falling?  No     Home Social worker Private residence   Living Arrangements Spouse/significant other   Available Help at [place] to enter   Entrance Stairs-Number of Steps [place] Two level;Able to live on main level with bedroom/bathroom   Home Equipment None     Prior Function   Level of Independence Independent   Leisure walk and exercise     Observation/Other Assessments    Focus on Therapeutic Outcomes (FOTO)  51% limitation     Posture/Postural Control   Posture/Postural Control Postural limitations   Postural Limitations Decreased lumbar lordosis     AROM   Lumbar Flexion 65   Lumbar Extension 25   Lumbar - Right Side Bend 50   Lumbar - Left Side Bend 40     Strength   Strength Assessment Site --  decreased thoracic and periscapular strength grosslly 4/5   Right/Left Hip Right;Left   Right Hip ABduction 4+/5   Left Hip ABduction 4+/5   Lumbar Flexion 3+/5  decreased activation of transverse abdominus muscles   Lumbar Extension 3+/5     Flexibility   Soft Tissue Assessment /Muscle Length yes   Hamstrings bil   Quadriceps bil hip flexors     Palpation   Palpation comment no tenderness     Slump test   Findings Negative     Straight Leg Raise   Findings Negative            Objective measurements completed on examination: See above findings.                  PT Education - 03/01/17 1741    Education provided Yes   Education Details supine HS stretch;  abdominal brace in supine;  ab brace with hand to knee push   Person(s) Educated Patient   Methods Explanation;Demonstration;Handout   Comprehension Verbalized understanding;Returned demonstration          PT Short Term Goals - 03/01/17 1749      PT SHORT TERM GOAL #1   Title The patient will demonstrate knowledge of basic HEP for core muscle activation and stretching  03/29/17   Time 4   Period Weeks   Status New     PT SHORT TERM GOAL #2   Title The patient will report a 30% improved in mid and low back pain with usual ADLS   Time 4   Period Weeks   Status New     PT SHORT TERM GOAL #3   Title The patient will be able to walk 30-45 minutes with minimal complaint of pain   Time 4   Period Weeks   Status New           PT Long Term Goals - 03/01/17 1752      PT LONG TERM GOAL #1   Title The patient will be independent in safe self  progression of  HEP for further improvements in pain and function   04/26/17   Time 8   Period Weeks   Status New     PT LONG TERM GOAL #2   Title The patient will report a 60% improvement in mid and low back pain with usual ADLs   Time 8   Period Weeks   Status New     PT LONG TERM GOAL #3   Title Lumbo/hip/pelvic and periscapular strength grossly 4 to 4+/5 needed for standing and walking longer periods of time   Time 8   Period Weeks   Status New     PT LONG TERM GOAL #4   Title FOTO functional outcome score improved form 51% limitation to 40% indicating improved function with less pain   Time 8   Period Weeks   Status New                Plan - 03/01/17 1743    Clinical Impression Statement The patient reports a long history of mid to lower back pain that has recently worsened for no apparent reason.  She reports it is worsened with turning too quickly, dehydration and she also wonders if her chest size may be pulling her forward with fatigue with standing to cook.  Lumbar ROM is WFLS.  Decreased lumbar lordosis and mild rounded shoulders.  Weakness and decreased activation of lumbar and hip core muscles.  Decreased muscle lengths bil HS and hip flexors.  She would benefit from PT to address these deficits.     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   Clinical Presentation Stable   Clinical Presentation due to: mid to low back pain   Clinical Decision Making Low   Rehab Potential Good   Clinical Impairments Affecting Rehab Potential none   PT Frequency 2x / week   PT Duration 8 weeks   PT Treatment/Interventions ADLs/Self Care Home Management;Cryotherapy;Electrical Stimulation;Moist Heat;Traction;Patient/family education;Therapeutic exercise;Therapeutic activities;Manual techniques;Taping;Dry needling;Ultrasound   PT Next Visit Plan abdominal brace progression;  core and hip strengthening;  add hip flexor stretching;  electrical stimulation/heat  as needed for pain control      Patient will benefit from skilled therapeutic intervention in order to improve the following deficits and impairments:  Decreased strength, Difficulty walking, Pain, Postural dysfunction  Visit Diagnosis: Chronic bilateral low back pain without sciatica - Plan: PT plan of care cert/re-cert  Muscle weakness (generalized) - Plan: PT plan of care cert/re-cert      G-Codes - 40/98/11 1755    Functional Assessment Tool Used (Outpatient Only) FOTO; clinical judgement    Functional Limitation Mobility: Walking and moving around   Mobility: Walking and Moving Around Current Status (B1478) At least 40 percent but less than 60 percent impaired, limited or restricted   Mobility: Walking and Moving Around Goal Status [phone]) At least 20 percent but less than 40 percent impaired, limited or restricted       Problem List Patient Active Problem List   Diagnosis Date Noted  . Post ERCP Pancreatitis 10/30/2016  . Asthma   . GERD (gastroesophageal reflux disease)   . Choledocholithiasis s/p ERCP 10/29/2016   . Chronic cholecystitis with calculus s/p lap cholecystectomy 10/28/2016 10/28/2016  . Hemorrhoids 10/12/2012  . Obesity 05/20/2011  . Constipation 05/20/2011    Ruben Im, PT 03/01/17 5:57 PM Phone: [phone] Fax: [phone]  Alvera Singh 03/01/2017, 5:56 PM  [facility] [address], [place], [place], [address] Phone: [phone]   Fax:  [phone]  Name: Sarah Lee MRN: [id number] Date of Birth: 1951-01-03

## 2017-03-03 ENCOUNTER — Ambulatory Visit: Payer: BLUE CROSS/BLUE SHIELD | Admitting: Physical Therapy

## 2017-03-03 DIAGNOSIS — M545 Low back pain, unspecified: Secondary | ICD-10-CM

## 2017-03-03 DIAGNOSIS — M6281 Muscle weakness (generalized): Secondary | ICD-10-CM

## 2017-03-03 DIAGNOSIS — G8929 Other chronic pain: Secondary | ICD-10-CM

## 2017-03-03 NOTE — Therapy (Addendum)
[facility] [address] [place], [place], [address] Phone: [phone]   Fax:  [phone]  Physical Therapy Treatment/Discharge Summary  Patient Details  Name: Sarah Lee MRN: [id number] Date of Birth: 1950/11/03 Referring Provider: Dr. Ernie Hew   Encounter Date: 03/03/2017      PT End of Session - 03/03/17 1412    Visit Number 2   Date for PT Re-Evaluation 04/26/17   Authorization Type  G codes;  KX at visit 14   PT Start Time 1410  pt arrived late   PT Stop Time 1444   PT Time Calculation (min) 34 min   Activity Tolerance Patient tolerated treatment well;No increased pain   Behavior During Therapy WFL for tasks assessed/performed      Past Medical History:  Diagnosis Date  . Allergy   . Anemia   . Asthma   . Blood transfusion   . Cataract    slight  . Complication of anesthesia    used sodium pentothal and very hard to wake up and arm swelled up from medication  . Constipation   . Gallstones   . GERD (gastroesophageal reflux disease)   . Heart murmur   . Seasonal allergies     Past Surgical History:  Procedure Laterality Date  . ABDOMINAL HYSTERECTOMY  2004   fibroids--- TAH/BSO  . BREAST SURGERY     right breast biopsy-benign  . CESAREAN SECTION    . CHOLECYSTECTOMY N/A 10/28/2016   Procedure: LAPAROSCOPIC CHOLECYSTECTOMY WITH INTRAOPERATIVE CHOLANGIOGRAM;  Surgeon: Jackolyn Confer, MD;  Location: [facility];  Service: General;  Laterality: N/A;  . ERCP N/A 10/29/2016   Procedure: ENDOSCOPIC RETROGRADE CHOLANGIOPANCREATOGRAPHY (ERCP);  Surgeon: Doran Stabler, MD;  Location: [facility];  Service: Endoscopy;  Laterality: N/A;  . UTERINE FIBROID SURGERY      There were no vitals filed for this visit.      Subjective Assessment - 03/03/17 1411    Subjective Pt reports that her back mostly hurts when she bends over to pick up item off floor; resolves once she stands up.  No new changes since  last visit.    Currently in Pain? No/denies   Pain Score 0-No pain            [facility] PT Assessment - 03/03/17 0001      Assessment   Medical Diagnosis back pain   Referring Provider Dr. Ernie Hew    Next MD Visit 2 weeks          [facility] Adult PT Treatment/Exercise - 03/03/17 0001      Self-Care   Self-Care Posture   Posture Pt issued information on posture and body mechanics.    Reviewed log roll for supine to/from sit.      Exercises   Exercises Lumbar     Lumbar Exercises: Stretches   Passive Hamstring Stretch 2 reps;30 seconds  supine with strap    Quad Stretch 2 reps;30 seconds     Lumbar Exercises: Aerobic   Stationary Bike NuStep L3-4: 5 min      Lumbar Exercises: Seated   Sit to Stand 5 reps  with TA engaged.    Other Seated Lumbar Exercises resisted hip flexion with ipsilateral and contralateral placement of hands on knees x 10 x 2 sets     Lumbar Exercises: Supine   Ab Set 10 reps;5 seconds  pt educated on 3 part core.    Clam 10 reps  with ab set  Bent Knee Raise 10 reps  with ab set                  PT Short Term Goals - 03/03/17 1445      PT SHORT TERM GOAL #1   Title The patient will demonstrate knowledge of basic HEP for core muscle activation and stretching  03/29/17   Time 4   Period Weeks   Status On-going     PT SHORT TERM GOAL #2   Title The patient will report a 30% improved in mid and low back pain with usual ADLS   Time 4   Period Weeks   Status On-going     PT SHORT TERM GOAL #3   Title The patient will be able to walk 30-45 minutes with minimal complaint of pain   Time 4   Period Weeks   Status On-going           PT Long Term Goals - 03/03/17 1445      PT LONG TERM GOAL #1   Title The patient will be independent in safe self progression of HEP for further improvements in pain and function   04/26/17   Time 8   Period Weeks   Status On-going     PT LONG TERM GOAL #2   Title The patient will report a 60%  improvement in mid and low back pain with usual ADLs   Time 8   Period Weeks   Status On-going     PT LONG TERM GOAL #3   Title Lumbo/hip/pelvic and periscapular strength grossly 4 to 4+/5 needed for standing and walking longer periods of time   Time 8   Period Weeks   Status On-going     PT LONG TERM GOAL #4   Title FOTO functional outcome score improved form 51% limitation to 40% indicating improved function with less pain   Time 8   Period Weeks   Status On-going               Plan - 03/03/17 1435    Clinical Impression Statement Pt had tight quads; ~90 deg knee flexion bilat with prone quad stretch.  Pt tolerated all exercises well, without any production of symptoms.  She required minor tactile cues for proper engagement of transverse abdominus during exercises.  Progressing towards goals.    Rehab Potential Good   PT Frequency 2x / week   PT Duration 8 weeks   PT Treatment/Interventions ADLs/Self Care Home Management;Cryotherapy;Electrical Stimulation;Moist Heat;Traction;Patient/family education;Therapeutic exercise;Therapeutic activities;Manual techniques;Taping;Dry needling;Ultrasound   PT Next Visit Plan Review HEP, progress back and core strengthening; add hip flexor stretch to HEP.    Consulted and Agree with Plan of Care Patient      Patient will benefit from skilled therapeutic intervention in order to improve the following deficits and impairments:  Decreased strength, Difficulty walking, Pain, Postural dysfunction  Visit Diagnosis: Chronic bilateral low back pain without sciatica  Muscle weakness (generalized)  PHYSICAL THERAPY DISCHARGE SUMMARY  Visits from Start of Care: 2  Current functional level related to goals / functional outcomes: The patient attended just 2 PT visits and has not returned since June.  Will discharge from PT.   Remaining deficits: No goals met   Education / Equipment: Initial HEP Plan: Patient agrees to discharge.   Patient goals were not met. Patient is being discharged due to not returning since the last visit.  ?????       Problem List Patient Active  Problem List   Diagnosis Date Noted  . Post ERCP Pancreatitis 10/30/2016  . Asthma   . GERD (gastroesophageal reflux disease)   . Choledocholithiasis s/p ERCP 10/29/2016   . Chronic cholecystitis with calculus s/p lap cholecystectomy 10/28/2016 10/28/2016  . Hemorrhoids 10/12/2012  . Obesity 05/20/2011  . Constipation 05/20/2011   Ruben Im, PT 06/16/17 10:19 AM Phone: [phone] Fax: [place], PTA 03/03/17 2:47 PM  [facility] [address], [place], [place], [address] Phone: [phone]   Fax:  [phone]  Name: Sarah Lee MRN: [id number] Date of Birth: 1951-04-04

## 2017-03-03 NOTE — Patient Instructions (Signed)
Abdominal Bracing With Pelvic Floor (Hook-Lying)   With neutral spine, tighten pelvic floor and abdominals. Hold 10 seconds. Repeat __10_ times. Do _1__ times a day.  You can practice this in sitting and standing.  Engage these same muscles when rising from sitting position, or when carrying/lifting objects to support spine.   Knee to Chest: Transverse Plane Stability   Bring one knee up, then return. Be sure pelvis does not roll side to side. Keep pelvis still. Lift knee __10_ times each leg. Restabilize pelvis. Repeat with other leg. Do _1-2__ sets, _1__ times per day.  Hip External Rotation With Pillow: Transverse Plane Stability   KEEP BOTH KNEES BENT. Slowly roll bent knee out. Be sure pelvis does not rotate. Do _10__ times. Restabilize pelvis. Repeat with other leg. Do _1-2__ sets, _1__ times per day.  KNEE: Quadriceps - Prone    Place strap around ankle. Bring ankle toward buttocks. Press hip into surface. Hold __30_ seconds. __2_ reps per set, _2__ sets per day, _5-7__ days per week    Sleeping on Back  Place pillow under knees. A pillow with cervical support and a roll around waist are also helpful. Copyright  VHI. All rights reserved.  Sleeping on Side Place pillow between knees. Use cervical support under neck and a roll around waist as needed. Copyright  VHI. All rights reserved.   Sleeping on Stomach   If this is the only desirable sleeping position, place pillow under lower legs, and under stomach or chest as needed.  Posture - Sitting   Sit upright, head facing forward. Try using a roll to support lower back. Keep shoulders relaxed, and avoid rounded back. Keep hips level with knees. Avoid crossing legs for long periods. Stand to Sit / Sit to Stand   To sit: Bend knees to lower self onto front edge of chair, then scoot back on seat. To stand: Reverse sequence by placing one foot forward, and scoot to front of seat. Use rocking motion to stand up.    Work Height and Reach  Ideal work height is no more than 2 to 4 inches below elbow level when standing, and at elbow level when sitting. Reaching should be limited to arm's length, with elbows slightly bent.  Bending  Bend at hips and knees, not back. Keep feet shoulder-width apart.    Posture - Standing   Good posture is important. Avoid slouching and forward head thrust. Maintain curve in low back and align ears over shoul- ders, hips over ankles.  Alternating Positions   Alternate tasks and change positions frequently to reduce fatigue and muscle tension. Take rest breaks. Computer Work   Position work to Programmer, multimedia. Use proper work and seat height. Keep shoulders back and down, wrists straight, and elbows at right angles. Use chair that provides full back support. Add footrest and lumbar roll as needed.  Getting Into / Out of Car  Lower self onto seat, scoot back, then bring in one leg at a time. Reverse sequence to get out.  Dressing  Lie on back to pull socks or slacks over feet, or sit and bend leg while keeping back straight.    Housework - Sink  Place one foot on ledge of cabinet under sink when standing at sink for prolonged periods.   Pushing / Pulling  Pushing is preferable to pulling. Keep back in proper alignment, and use leg muscles to do the work.  Deep Squat   Squat and lift with both arms held  against upper trunk. Tighten stomach muscles without holding breath. Use smooth movements to avoid jerking.  Avoid Twisting   Avoid twisting or bending back. Pivot around using foot movements, and bend at knees if needed when reaching for articles.  Carrying Luggage   Distribute weight evenly on both sides. Use a cart whenever possible. Do not twist trunk. Move body as a unit.   Lifting Principles .Maintain proper posture and head alignment. .Slide object as close as possible before lifting. .Move obstacles out of the way. .Test before  lifting; ask for help if too heavy. .Tighten stomach muscles without holding breath. .Use smooth movements; do not jerk. .Use legs to do the work, and pivot with feet. .Distribute the work load symmetrically and close to the center of trunk. .Push instead of pull whenever possible.   Ask For Help   Ask for help and delegate to others when possible. Coordinate your movements when lifting together, and maintain the low back curve.  Log Roll   Lying on back, bend left knee and place left arm across chest. Roll all in one movement to the right. Reverse to roll to the left. Always move as one unit. Housework - Sweeping  Use long-handled equipment to avoid stooping.   Housework - Wiping  Position yourself as close as possible to reach work surface. Avoid straining your back.  Laundry - Unloading Wash   To unload small items at bottom of washer, lift leg opposite to arm being used to reach.  [place] close to area to be raked. Use arm movements to do the work. Keep back straight and avoid twisting.     Cart  When reaching into cart with one arm, lift opposite leg to keep back straight.   Getting Into / Out of Bed  Lower self to lie down on one side by raising legs and lowering head at the same time. Use arms to assist moving without twisting. Bend both knees to roll onto back if desired. To sit up, start from lying on side, and use same move-ments in reverse. Housework - Vacuuming  Hold the vacuum with arm held at side. Step back and forth to move it, keeping head up. Avoid twisting.   Laundry - IT consultant so that bending and twisting can be avoided.   Laundry - Unloading Dryer  Squat down to reach into clothes dryer or use a reacher.  Gardening - Weeding / Probation officer or Kneel. Knee pads may be helpful.

## 2017-03-22 DIAGNOSIS — J309 Allergic rhinitis, unspecified: Secondary | ICD-10-CM | POA: Diagnosis not present

## 2017-03-22 DIAGNOSIS — Z6836 Body mass index (BMI) 36.0-36.9, adult: Secondary | ICD-10-CM | POA: Diagnosis not present

## 2017-03-22 DIAGNOSIS — J45901 Unspecified asthma with (acute) exacerbation: Secondary | ICD-10-CM | POA: Diagnosis not present

## 2017-04-26 DIAGNOSIS — K5900 Constipation, unspecified: Secondary | ICD-10-CM | POA: Diagnosis not present

## 2017-04-26 DIAGNOSIS — J309 Allergic rhinitis, unspecified: Secondary | ICD-10-CM | POA: Diagnosis not present

## 2017-04-26 DIAGNOSIS — B078 Other viral warts: Secondary | ICD-10-CM | POA: Diagnosis not present

## 2017-05-24 DIAGNOSIS — Z1231 Encounter for screening mammogram for malignant neoplasm of breast: Secondary | ICD-10-CM | POA: Diagnosis not present

## 2017-05-31 DIAGNOSIS — E559 Vitamin D deficiency, unspecified: Secondary | ICD-10-CM | POA: Diagnosis not present

## 2017-06-06 DIAGNOSIS — Z6837 Body mass index (BMI) 37.0-37.9, adult: Secondary | ICD-10-CM | POA: Diagnosis not present

## 2017-06-06 DIAGNOSIS — R5383 Other fatigue: Secondary | ICD-10-CM | POA: Diagnosis not present

## 2017-06-06 DIAGNOSIS — E559 Vitamin D deficiency, unspecified: Secondary | ICD-10-CM | POA: Diagnosis not present

## 2017-06-17 ENCOUNTER — Encounter: Payer: Self-pay | Admitting: Gastroenterology

## 2017-08-31 DIAGNOSIS — E559 Vitamin D deficiency, unspecified: Secondary | ICD-10-CM | POA: Diagnosis not present

## 2017-09-13 DIAGNOSIS — C3490 Malignant neoplasm of unspecified part of unspecified bronchus or lung: Secondary | ICD-10-CM

## 2017-09-13 HISTORY — PX: COLONOSCOPY: SHX174

## 2017-09-13 HISTORY — DX: Malignant neoplasm of unspecified part of unspecified bronchus or lung: C34.90

## 2017-09-13 HISTORY — PX: POLYPECTOMY: SHX149

## 2017-11-21 DIAGNOSIS — Z6837 Body mass index (BMI) 37.0-37.9, adult: Secondary | ICD-10-CM | POA: Diagnosis not present

## 2017-11-21 DIAGNOSIS — K5900 Constipation, unspecified: Secondary | ICD-10-CM | POA: Diagnosis not present

## 2017-11-21 DIAGNOSIS — M545 Low back pain: Secondary | ICD-10-CM | POA: Diagnosis not present

## 2017-11-21 DIAGNOSIS — M479 Spondylosis, unspecified: Secondary | ICD-10-CM | POA: Diagnosis not present

## 2017-11-21 DIAGNOSIS — R109 Unspecified abdominal pain: Secondary | ICD-10-CM | POA: Diagnosis not present

## 2018-01-03 DIAGNOSIS — K5900 Constipation, unspecified: Secondary | ICD-10-CM | POA: Diagnosis not present

## 2018-01-03 DIAGNOSIS — M545 Low back pain: Secondary | ICD-10-CM | POA: Diagnosis not present

## 2018-01-03 DIAGNOSIS — Z6837 Body mass index (BMI) 37.0-37.9, adult: Secondary | ICD-10-CM | POA: Diagnosis not present

## 2018-01-03 DIAGNOSIS — M5431 Sciatica, right side: Secondary | ICD-10-CM | POA: Diagnosis not present

## 2018-01-05 ENCOUNTER — Other Ambulatory Visit: Payer: Self-pay | Admitting: Family Medicine

## 2018-01-05 DIAGNOSIS — Z1211 Encounter for screening for malignant neoplasm of colon: Secondary | ICD-10-CM | POA: Diagnosis not present

## 2018-01-05 DIAGNOSIS — Z0000 Encounter for general adult medical examination without abnormal findings: Secondary | ICD-10-CM | POA: Diagnosis not present

## 2018-01-05 DIAGNOSIS — E2839 Other primary ovarian failure: Secondary | ICD-10-CM

## 2018-01-05 DIAGNOSIS — Z6836 Body mass index (BMI) 36.0-36.9, adult: Secondary | ICD-10-CM | POA: Diagnosis not present

## 2018-01-06 ENCOUNTER — Inpatient Hospital Stay: Admission: RE | Admit: 2018-01-06 | Payer: PPO | Source: Ambulatory Visit

## 2018-01-06 ENCOUNTER — Ambulatory Visit
Admission: RE | Admit: 2018-01-06 | Discharge: 2018-01-06 | Disposition: A | Discharge status: Home / Self Care | Payer: PPO | Source: Ambulatory Visit | Attending: Family Medicine | Admitting: Family Medicine

## 2018-01-06 DIAGNOSIS — M85851 Other specified disorders of bone density and structure, right thigh: Secondary | ICD-10-CM | POA: Diagnosis not present

## 2018-01-06 DIAGNOSIS — E2839 Other primary ovarian failure: Secondary | ICD-10-CM

## 2018-01-10 ENCOUNTER — Encounter: Payer: Self-pay | Admitting: Gastroenterology

## 2018-01-12 DIAGNOSIS — E559 Vitamin D deficiency, unspecified: Secondary | ICD-10-CM | POA: Diagnosis not present

## 2018-01-17 ENCOUNTER — Other Ambulatory Visit: Payer: Self-pay

## 2018-01-17 ENCOUNTER — Ambulatory Visit (AMBULATORY_SURGERY_CENTER): Payer: Self-pay | Admitting: *Deleted

## 2018-01-17 ENCOUNTER — Encounter: Payer: Self-pay | Admitting: Gastroenterology

## 2018-01-17 VITALS — Ht 64.0 in | Wt 216.2 lb

## 2018-01-17 DIAGNOSIS — Z86010 Personal history of colonic polyps: Secondary | ICD-10-CM

## 2018-01-17 MED ORDER — PEG 3350-KCL-NA BICARB-NACL 420 G PO SOLR: 4000.0000 mL | Freq: Once | ORAL | 0 refills | Status: AC

## 2018-01-17 NOTE — Progress Notes (Addendum)
No egg or soy allergy known to patient  No issues with past sedation with any surgeries  or procedures, no intubation problems   (Arm swelled once with sodium thiopental) many years ago No diet pills per patient No home 02 use per patient  No blood thinners per patient  Pt has some issues with constipation on Linzess does not take daily No A fib A flutter  On and off  EMMI video sent to pt's e mail   Pt. States she has had an episode of rectal bleeding after taking Advil.

## 2018-01-23 ENCOUNTER — Ambulatory Visit (AMBULATORY_SURGERY_CENTER): Payer: PPO | Admitting: Gastroenterology

## 2018-01-23 ENCOUNTER — Telehealth: Payer: Self-pay

## 2018-01-23 ENCOUNTER — Encounter: Payer: Self-pay | Admitting: Gastroenterology

## 2018-01-23 ENCOUNTER — Other Ambulatory Visit: Payer: Self-pay

## 2018-01-23 VITALS — BP 130/83 | HR 72 | Temp 98.0°F | Resp 22 | Ht 64.0 in | Wt 216.0 lb

## 2018-01-23 DIAGNOSIS — K219 Gastro-esophageal reflux disease without esophagitis: Secondary | ICD-10-CM | POA: Diagnosis not present

## 2018-01-23 DIAGNOSIS — Z86010 Personal history of colonic polyps: Secondary | ICD-10-CM | POA: Diagnosis present

## 2018-01-23 DIAGNOSIS — D122 Benign neoplasm of ascending colon: Secondary | ICD-10-CM

## 2018-01-23 DIAGNOSIS — D120 Benign neoplasm of cecum: Secondary | ICD-10-CM

## 2018-01-23 MED ORDER — SODIUM CHLORIDE 0.9 % IV SOLN: 500.0000 mL | Freq: Once | INTRAVENOUS | Status: DC

## 2018-01-23 NOTE — Progress Notes (Signed)
No problems noted in the recovery room. maw 

## 2018-01-23 NOTE — Progress Notes (Signed)
Called to room to assist during endoscopic procedure.  Patient ID and intended procedure confirmed with present staff. Received instructions for my participation in the procedure from the performing physician.  

## 2018-01-23 NOTE — Telephone Encounter (Signed)
Phone call made in error.  Pt's procedure is today 01-23-18. maw

## 2018-01-23 NOTE — Patient Instructions (Signed)
YOU HAD AN ENDOSCOPIC PROCEDURE TODAY AT [facility]:   Refer to the procedure report that was given to you for any specific questions about what was found during the examination.  If the procedure report does not answer your questions, please call your gastroenterologist to clarify.  If you requested that your care partner not be given the details of your procedure findings, then the procedure report has been included in a sealed envelope for you to review at your convenience later.  YOU SHOULD EXPECT: Some feelings of bloating in the abdomen. Passage of more gas than usual.  Walking can help get rid of the air that was put into your GI tract during the procedure and reduce the bloating. If you had a lower endoscopy (such as a colonoscopy or flexible sigmoidoscopy) you may notice spotting of blood in your stool or on the toilet paper. If you underwent a bowel prep for your procedure, you may not have a normal bowel movement for a few days.  Please Note:  You might notice some irritation and congestion in your nose or some drainage.  This is from the oxygen used during your procedure.  There is no need for concern and it should clear up in a day or so.  SYMPTOMS TO REPORT IMMEDIATELY:   Following lower endoscopy (colonoscopy or flexible sigmoidoscopy):  Excessive amounts of blood in the stool  Significant tenderness or worsening of abdominal pains  Swelling of the abdomen that is new, acute  Fever of 100F or higher   For urgent or emergent issues, a gastroenterologist can be reached at any hour by calling [phone].   DIET:  We do recommend a small meal at first, but then you may proceed to your regular diet.  Drink plenty of fluids but you should avoid alcoholic beverages for 24 hours.  ACTIVITY:  You should plan to take it easy for the rest of today and you should NOT DRIVE or use heavy machinery until tomorrow (because of the sedation medicines used during the test).     FOLLOW UP: Our staff will call the number listed on your records the next business day following your procedure to check on you and address any questions or concerns that you may have regarding the information given to you following your procedure. If we do not reach you, we will leave a message.  However, if you are feeling well and you are not experiencing any problems, there is no need to return our call.  We will assume that you have returned to your regular daily activities without incident.  If any biopsies were taken you will be contacted by phone or by letter within the next 1-3 weeks.  Please call us at [phone] if you have not heard about the biopsies in 3 weeks.    SIGNATURES/CONFIDENTIALITY: You and/or your care partner have signed paperwork which will be entered into your electronic medical record.  These signatures attest to the fact that that the information above on your After Visit Summary has been reviewed and is understood.  Full responsibility of the confidentiality of this discharge information lies with you and/or your care-partner.   Handout was given to your care partner on polyps. You may resume your current medications today. Await biopsy results. Please call if any questions or concerns.

## 2018-01-23 NOTE — Progress Notes (Signed)
Pt's states no medical or surgical changes since previsit or office visit. 

## 2018-01-23 NOTE — Op Note (Signed)
[facility] Patient Name: Sarah Lee Procedure Date: 01/23/2018 8:12 AM MRN: [id number] Endoscopist: Mallie Mussel L. Loletha Carrow , MD Age: 67 Referring MD:  Date of Birth: October 29, 1950 Gender: Female Account #: [id number] Procedure:                Colonoscopy Indications:              Personal history of colonic polyps (last                            colonoscopy 05/2012) Medicines:                Monitored Anesthesia Care Procedure:                Pre-Anesthesia Assessment:                           - Prior to the procedure, a History and Physical                            was performed, and patient medications and                            allergies were reviewed. The patient's tolerance of                            previous anesthesia was also reviewed. The risks                            and benefits of the procedure and the sedation                            options and risks were discussed with the patient.                            All questions were answered, and informed consent                            was obtained. Prior Anticoagulants: The patient has                            taken no previous anticoagulant or antiplatelet                            agents. ASA Grade Assessment: II - A patient with                            mild systemic disease. After reviewing the risks                            and benefits, the patient was deemed in                            satisfactory condition to undergo the procedure.  After obtaining informed consent, the colonoscope                            was passed under direct vision. Throughout the                            procedure, the patient's blood pressure, pulse, and                            oxygen saturations were monitored continuously. The                            Colonoscope was introduced through the anus and                            advanced to the the cecum, identified by                  appendiceal orifice and ileocecal valve. The                            colonoscopy was performed without difficulty. The                            patient tolerated the procedure well. The quality                            of the bowel preparation was excellent. The                            ileocecal valve, appendiceal orifice, and rectum                            were photographed. The quality of the bowel                            preparation was evaluated using the BBPS [facility]                            Bowel Preparation Scale) with scores of: Right                            Colon = 3, Transverse Colon = 3 and Left Colon = 3                            (entire mucosa seen well with no residual staining,                            small fragments of stool or opaque liquid). The                            total BBPS score equals 9. The bowel preparation  used was GoLYTELY. Scope In: 8:20:59 AM Scope Out: 8:35:29 AM Scope Withdrawal Time: 0 hours 11 minutes 11 seconds  Total Procedure Duration: 0 hours 14 minutes 30 seconds  Findings:                 The perianal and digital rectal examinations were                            normal.                           A 2 mm polyp was found in the cecum. The polyp was                            sessile. The polyp was removed with a cold biopsy                            forceps. Resection and retrieval were complete.                           A 4 mm polyp was found in the proximal ascending                            colon. The polyp was sessile. The polyp was removed                            with a cold snare. Resection and retrieval were                            complete.                           The exam was otherwise without abnormality on                            direct and retroflexion views. Complications:            No immediate complications. Estimated Blood Loss:     Estimated  blood loss was minimal. Impression:               - One 2 mm polyp in the cecum, removed with a cold                            biopsy forceps. Resected and retrieved.                           - One 4 mm polyp in the proximal ascending colon,                            removed with a cold snare. Resected and retrieved.                           - The examination was otherwise normal on direct  and retroflexion views. Recommendation:           - Patient has a contact number available for                            emergencies. The signs and symptoms of potential                            delayed complications were discussed with the                            patient. Return to normal activities tomorrow.                            Written discharge instructions were provided to the                            patient.                           - Resume previous diet.                           - Continue present medications.                           - Await pathology results.                           - Repeat colonoscopy is recommended for                            surveillance. The colonoscopy date will be                            determined after pathology results from today's                            exam become available for review.  L. Loletha Carrow, MD 01/23/2018 8:40:14 AM This report has been signed electronically.

## 2018-01-23 NOTE — Progress Notes (Signed)
During prep-procedure discussion, the patient reported that she does not have a family history of colon cancer. At the time of her last colonoscopy in 2013, she believed her father had had colon cancer.  However, she later learned it was actually prostate cancer.

## 2018-01-23 NOTE — Telephone Encounter (Signed)
No telephone number was in the phone call.  Called [phone] no answer.  I did not leave a message, since pt had not said we could.  Will try to call back this afternoon. maw

## 2018-01-23 NOTE — Progress Notes (Signed)
Report given to PACU, vss 

## 2018-01-24 ENCOUNTER — Telehealth: Payer: Self-pay

## 2018-01-24 NOTE — Telephone Encounter (Signed)
  Follow up Call-  Call back number 01/23/2018  Post procedure Call Back phone  # [phone]  Permission to leave phone message Yes  Some recent data might be hidden     Patient questions:  Do you have a fever, pain , or abdominal swelling? No. Pain Score  0 *  Have you tolerated food without any problems? Yes.    Have you been able to return to your normal activities? Yes.    Do you have any questions about your discharge instructions: Diet   No. Medications  No. Follow up visit  No.  Do you have questions or concerns about your Care? No.  Actions: * If pain score is 4 or above: No action needed, pain <4.

## 2018-01-31 ENCOUNTER — Encounter: Payer: Self-pay | Admitting: Gastroenterology

## 2018-07-31 DIAGNOSIS — Z1231 Encounter for screening mammogram for malignant neoplasm of breast: Secondary | ICD-10-CM | POA: Diagnosis not present

## 2018-08-07 DIAGNOSIS — M5414 Radiculopathy, thoracic region: Secondary | ICD-10-CM | POA: Diagnosis not present

## 2018-08-07 DIAGNOSIS — M5416 Radiculopathy, lumbar region: Secondary | ICD-10-CM | POA: Diagnosis not present

## 2018-08-07 DIAGNOSIS — M5134 Other intervertebral disc degeneration, thoracic region: Secondary | ICD-10-CM | POA: Diagnosis not present

## 2019-06-20 ENCOUNTER — Other Ambulatory Visit: Payer: Self-pay | Admitting: Gastroenterology

## 2019-06-20 DIAGNOSIS — R1031 Right lower quadrant pain: Secondary | ICD-10-CM

## 2019-07-04 ENCOUNTER — Ambulatory Visit
Admission: RE | Admit: 2019-07-04 | Discharge: 2019-07-04 | Disposition: A | Discharge status: Home / Self Care | Payer: BC Managed Care – PPO | Source: Ambulatory Visit | Attending: Gastroenterology | Admitting: Gastroenterology

## 2019-07-04 DIAGNOSIS — R1031 Right lower quadrant pain: Secondary | ICD-10-CM

## 2019-07-04 MED ORDER — IOPAMIDOL (ISOVUE-300) INJECTION 61%
125.0000 mL | Freq: Once | INTRAVENOUS | Status: AC | PRN
  Administered 2019-07-04: 15:00:00 125 mL via INTRAVENOUS

## 2019-07-11 ENCOUNTER — Encounter: Payer: Self-pay | Admitting: Internal Medicine

## 2019-07-11 ENCOUNTER — Other Ambulatory Visit: Payer: Self-pay

## 2019-07-11 ENCOUNTER — Ambulatory Visit (INDEPENDENT_AMBULATORY_CARE_PROVIDER_SITE_OTHER): Payer: BC Managed Care – PPO | Admitting: Internal Medicine

## 2019-07-11 DIAGNOSIS — R911 Solitary pulmonary nodule: Secondary | ICD-10-CM

## 2019-07-11 DIAGNOSIS — R918 Other nonspecific abnormal finding of lung field: Secondary | ICD-10-CM

## 2019-07-11 DIAGNOSIS — R1031 Right lower quadrant pain: Secondary | ICD-10-CM | POA: Diagnosis not present

## 2019-07-11 DIAGNOSIS — R109 Unspecified abdominal pain: Secondary | ICD-10-CM | POA: Insufficient documentation

## 2019-07-11 LAB — CBC WITH DIFFERENTIAL/PLATELET
Basophils Absolute: 0 10*3/uL (ref 0.0–0.1)
Basophils Relative: 0.3 % (ref 0.0–3.0)
Eosinophils Absolute: 0 10*3/uL (ref 0.0–0.7)
Eosinophils Relative: 0.9 % (ref 0.0–5.0)
HCT: 37.4 % (ref 36.0–46.0)
Hemoglobin: 12.4 g/dL (ref 12.0–15.0)
Lymphocytes Relative: 27.5 % (ref 12.0–46.0)
Lymphs Abs: 1.4 10*3/uL (ref 0.7–4.0)
MCHC: 33 g/dL (ref 30.0–36.0)
MCV: 88.6 fl (ref 78.0–100.0)
Monocytes Absolute: 0.4 10*3/uL (ref 0.1–1.0)
Monocytes Relative: 7.6 % (ref 3.0–12.0)
Neutro Abs: 3.3 10*3/uL (ref 1.4–7.7)
Neutrophils Relative %: 63.7 % (ref 43.0–77.0)
Platelets: 200 10*3/uL (ref 150.0–400.0)
RBC: 4.22 Mil/uL (ref 3.87–5.11)
RDW: 13.1 % (ref 11.5–15.5)
WBC: 5.2 10*3/uL (ref 4.0–10.5)

## 2019-07-11 LAB — SEDIMENTATION RATE: Sed Rate: 17 mm/hr (ref 0–30)

## 2019-07-11 NOTE — Assessment & Plan Note (Addendum)
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    She should tol LLLobectomy fine if need so will refer directly to T surgery for excisional bx and lobectomy after review the results of PET with her.    Discussed in detail all the  indications, usual  risks and alternatives  relative to the benefits with patient who agrees to proceed with w/u as outlined.     Addendum 07/20/2019 : pt informed me does have active dental problems with pus intermittently from areas around  several broken > rx abx x 2 weeks then ov

## 2019-07-11 NOTE — Progress Notes (Addendum)
Sarah Lee, female    DOB: December 09, 1950    MRN: [id number]   Brief patient profile:  51 yobf quit smoking 1989  With asthma as child outgrew by HS ,  And no symptoms at d/c smoking  But developed "nasal allergies" variable times of the year with need for prn saba  rx  sicne 2004  But not needing chronically and stopped completelyn2018  and referred to pulmonary clinic 07/11/2019 by Dr   Sarah Lee for abn  CT chest cuts on abd ct done 07/04/19 for  w/u for episodic RLQ/ flank pain with LLL as dz (pos airbronchograms) but no symptoms of pna      History of Present Illness  07/11/2019  Pulmonary/ 1st office eval/Sarah Lee  Chief Complaint  Patient presents with  . Pulmonary Consult    Referred by Dr. Evette Lee for eval of incidental lung mass.   Dyspnea:  Rushing to get across parking lot / steps are ok  Cough: periodic but more since 1.5 year/ mucus is white  At times slt bloody better for "a while" but still feels daytime need to  clear her throat even when non productive Sleep: fine flat  SABA use: none x sev years  Pain is once or twice weekly x months lasting  from a few minutes to a few hours very positional, usually worse sitting.  Late Add hx:  More sweats x several months/ some chills / no purulent sputum for over a year. Dental problems for years last visit was 6 m prior to OV  / feels lump in roof in mouth is back and intermittently drains pus but hasn't had money to return to dentist so originally checked the no dental problems/ procedure box on the entry form.    No obvious other patterns in day to day or daytime variability or assoc ongoing  excess/ purulent sputum or mucus plugs or hemoptysis or cp or chest tightness, subjective wheeze or overt sinus or hb symptoms.   Sleeping flat  without nocturnal  or early am exacerbation  of respiratory  c/o's or need for noct saba. Also denies any obvious fluctuation of symptoms with weather or environmental changes or other aggravating or  alleviating factors except as outlined above   No unusual exposure hx or h/o childhood pna  or knowledge of premature birth.  Current Allergies, Complete Past Medical History, Past Surgical History, Family History, and Social History were reviewed in [employer] record.  ROS  The following are not active complaints unless bolded Hoarseness, sore throat, dysphagia, dental problems, itching, sneezing,  nasal congestion or discharge of excess mucus or purulent secretions, ear ache,   fever, chills, sweats, unintended wt loss or wt gain, classically pleuritic or exertional cp,  orthopnea pnd or arm/hand swelling  or leg swelling, presyncope, palpitations, abdominal pain, anorexia, nausea, vomiting, diarrhea  or change in bowel habits or change in bladder habits, change in stools or change in urine, dysuria, hematuria,  rash, arthralgias, visual complaints, headache, numbness, weakness or ataxia or problems with walking or coordination,  change in mood or  memory.             Past Medical History:  Diagnosis Date  . Allergy   . Anemia   . Arthritis   . Asthma   . Blood transfusion   . Cataract    slight  . Complication of anesthesia    used sodium pentothal and very hard to wake up and arm swelled up  from medication  . Constipation   . Gallstones   . GERD (gastroesophageal reflux disease)   . Heart murmur   . Seasonal allergies     Outpatient Medications Prior to Visit  Medication Sig Dispense Refill  . cholecalciferol (VITAMIN D) 1000 units tablet Take 5,000 Units by mouth daily.     Marland Kitchen Specialty Vitamins Products (JOINT/BONE VITALITY PO) Take 1 tablet by mouth daily.    . TURMERIC PO Take 1 each by mouth daily. Tea bag liquid    .       . diclofenac sodium (VOLTAREN) 1 % GEL Apply 1 application topically as needed.    . linaclotide (LINZESS) 145 MCG CAPS capsule Take 145 mcg by mouth as needed.        Objective:     BP 116/78 (BP Location: Left Arm,  Cuff Size: Normal)   Pulse 77   Temp (!) 97.3 F (36.3 C) (Temporal)   Ht 5' 4.5" (1.638 m)   Wt 219 lb (99.3 kg)   LMP 01/17/1994   SpO2 96% Comment: on RA  BMI 37.01 kg/m   SpO2: 96 %(on RA)   Pleasant amb bf nad   Reports edentulous   HEENT : pt wearing mask not removed for exam due to covid -19 concerns.    NECK :  without JVD/Nodes/TM/ nl carotid upstrokes bilaterally   LUNGS: no acc muscle use,  Nl contour chest which is clear to A and P bilaterally without cough on insp or exp maneuvers   CV:  RRR  no s3 or murmur or increase in P2, and no edema   ABD:  soft and nontender with nl inspiratory excursion in the supine position. No bruits or organomegaly appreciated, bowel sounds nl  MS:  Nl gait/ ext warm without deformities, calf tenderness, cyanosis or clubbing No obvious joint restrictions   SKIN: warm and dry without lesions    NEURO:  alert, approp, nl sensorium with  no motor or cerebellar deficits apparent.       I personally reviewed images and agree with radiology impression as follows:   Chest CT chest on abd ct cuts 07/04/2019 1. No acute process in the abdomen or pelvis. No explanation for right lower quadrant pain, nausea, or constipation. 2. Left lower lobe masslike consolidation. New since remote chest radiograph. Correlate with infectious symptoms. Especially if no infectious symptoms, consider   possible PET. 3. 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    She should tol LLLobectomy fine if need so will refer directly to T  surgery for excisional bx and lobectomy after review the results of PET with her.    Discussed in detail all the  indications, usual  risks and alternatives  relative to the benefits with patient who agrees to proceed with w/u as outlined.      >>>> Addendum 07/20/2019 : pt informed me does have active dental problems with pus intermittently from areas around  several broken > rx abx x 2 weeks then ov   Abdominal pain  rec rx  as ibs 07/11/2019 >>>   The postional nature of the pain is typical of IBS so rec diet/ citrucel and f/u GI as planned    Total time devoted to counseling  > 50 % of initial 60 min office visit:  review case with pt/ discussion of options/alternatives/ personally creating written customized instructions  in presence of pt  then going over those specific  Instructions directly with the pt including how to use all of the meds but in particular covering each new medication in detail and the difference between the maintenance= "automatic" meds and the prns using an action plan format for the latter (If this problem/symptom => do that organization reading Left to right).  Please see AVS from this visit for a full list of these instructions which I personally wrote for this pt and  are unique to this visit.        Sandrea Hughs, MD 07/11/2019

## 2019-07-11 NOTE — Patient Instructions (Addendum)
Please remember to go to the lab department   for your tests - we will call you with the results when they are available.      We will call you to set up a PET scan    abdominal pain is  very limited distribution of pain locations, daytime, not usually exacerbated by exercise  or coughing, worse in sitting position, frequently associated with generalized abd bloating or constipation, not as likely to be present supine due to the dome effect of the diaphragm which  is  canceled in that position. Frequently these patients have had multiple negative GI workups and CT scans of abdomen like yours.  Treatment consists of avoiding foods that cause gas (especially boiled eggs, mexcican food but especially  beans and undercooked vegetables like  spinach and some salads)  and Citrucel 1 heaping tsp twice daily with a large glass of water.  Pain should improve w/in 2 weeks and if not then consider further GI work up.     We will arrange follow up according to above results  Add: pt gave hx of dental infection p PET already done > rx omicef (rash only from pen) x 2 weeks then ov with cxr

## 2019-07-11 NOTE — Assessment & Plan Note (Addendum)
rec rx  as ibs 07/11/2019 >>>   The postional nature of the pain is typical of IBS so rec diet/ citrucel and f/u GI as planned    Total time devoted to counseling  > 50 % of initial 60 min office visit:  review case with pt/ discussion of options/alternatives/ personally creating written customized instructions  in presence of pt  then going over those specific  Instructions directly with the pt including how to use all of the meds but in particular covering each new medication in detail and the difference between the maintenance= "automatic" meds and the prns using an action plan format for the latter (If this problem/symptom => do that organization reading Left to right).  Please see AVS from this visit for a full list of these instructions which I personally wrote for this pt and  are unique to this visit.

## 2019-07-13 LAB — QUANTIFERON-TB GOLD PLUS
Mitogen-NIL: 7.26 IU/mL
NIL: 0.02 IU/mL
QuantiFERON-TB Gold Plus: NEGATIVE
TB1-NIL: 0.01 IU/mL
TB2-NIL: 0 IU/mL

## 2019-07-17 ENCOUNTER — Telehealth: Payer: Self-pay | Admitting: Internal Medicine

## 2019-07-17 NOTE — Telephone Encounter (Signed)
When I called Gabriel Cirri back she stated that she now sees both Auth's for this patients PET

## 2019-07-19 ENCOUNTER — Encounter (HOSPITAL_COMMUNITY)
Admission: RE | Admit: 2019-07-19 | Discharge: 2019-07-19 | Disposition: A | Discharge status: Home / Self Care | Payer: BC Managed Care – PPO | Source: Ambulatory Visit | Attending: Internal Medicine | Admitting: Internal Medicine

## 2019-07-19 ENCOUNTER — Other Ambulatory Visit: Payer: Self-pay

## 2019-07-19 DIAGNOSIS — R918 Other nonspecific abnormal finding of lung field: Secondary | ICD-10-CM | POA: Insufficient documentation

## 2019-07-19 DIAGNOSIS — R911 Solitary pulmonary nodule: Secondary | ICD-10-CM | POA: Diagnosis present

## 2019-07-19 LAB — GLUCOSE, CAPILLARY: Glucose-Capillary: 86 mg/dL (ref 70–99)

## 2019-07-19 MED ORDER — FLUDEOXYGLUCOSE F - 18 (FDG) INJECTION
10.5700 | Freq: Once | INTRAVENOUS | Status: AC | PRN
  Administered 2019-07-19: 10.57 via INTRAVENOUS

## 2019-07-20 ENCOUNTER — Telehealth: Payer: Self-pay | Admitting: Internal Medicine

## 2019-07-20 ENCOUNTER — Telehealth: Payer: Self-pay | Admitting: *Deleted

## 2019-07-20 MED ORDER — CEFDINIR 300 MG PO CAPS: 300.0000 mg | ORAL_CAPSULE | Freq: Two times a day (BID) | ORAL | 0 refills | Status: DC

## 2019-07-20 NOTE — Telephone Encounter (Signed)
Called [facility] and spoke with pharmacist  She states pt had prednisone tapers filled there in July 2020 and in Nov 2019 but NO abx  Pt states she thinks she may have used [facility] a long time ago, but not sure  I called and checked with them and they stated that the pt had never had anything filled there before  Will forward to MW to make him aware

## 2019-07-20 NOTE — Telephone Encounter (Signed)
See same day phone note

## 2019-07-20 NOTE — Addendum Note (Signed)
Addended by: Christinia Gully B on: 07/20/2019 11:46 AM   Modules accepted: Orders

## 2019-07-20 NOTE — Telephone Encounter (Signed)
Spoke with pt, she states someone called her but there is no message in the chart. She does have labwork and a PET scan result but MW hasn't reviewed it yet. MW did you call pt? Please advise.

## 2019-07-20 NOTE — Telephone Encounter (Signed)
appt scheduled

## 2019-07-20 NOTE — Telephone Encounter (Signed)
-----   Message from Tanda Rockers, MD sent at 07/20/2019 10:56 AM EST ----- Call [facility] for list of abx she's had

## 2019-07-20 NOTE — Telephone Encounter (Signed)
Called in [place] as nothing in computer x for cipro as inpt and only had mild rash to pcn.  rec ov/  cxr in 2 weeks

## 2019-08-02 ENCOUNTER — Ambulatory Visit: Payer: BC Managed Care – PPO | Admitting: Internal Medicine

## 2019-08-08 ENCOUNTER — Ambulatory Visit (INDEPENDENT_AMBULATORY_CARE_PROVIDER_SITE_OTHER): Payer: BC Managed Care – PPO | Admitting: Internal Medicine

## 2019-08-08 ENCOUNTER — Other Ambulatory Visit: Payer: Self-pay

## 2019-08-08 ENCOUNTER — Encounter: Payer: Self-pay | Admitting: Internal Medicine

## 2019-08-08 ENCOUNTER — Ambulatory Visit (INDEPENDENT_AMBULATORY_CARE_PROVIDER_SITE_OTHER): Payer: BC Managed Care – PPO

## 2019-08-08 DIAGNOSIS — R918 Other nonspecific abnormal finding of lung field: Secondary | ICD-10-CM

## 2019-08-08 NOTE — Patient Instructions (Addendum)
We will call you to arrange a surgical consultation with Dr Hendrickson's group (thoracic surgery)

## 2019-08-08 NOTE — Progress Notes (Signed)
Sarah Lee, female    DOB: 1950/10/18    MRN: [id number]   Brief patient profile:  65 yobf quit smoking 1989  With asthma as child outgrew by HS ,  And no symptoms at d/c smoking  But developed "nasal allergies" variable times of the year with need for prn saba  rx  since 2004  But not needing chronically and stopped completelyn2018  and referred to pulmonary clinic 07/11/2019 by Dr   Evette Cristal for abn  CT chest cuts on abd ct done 07/04/19 for  w/u for episodic RLQ/ flank pain with LLL as dz (pos airbronchograms) but no symptoms of pna      History of Present Illness  07/11/2019  Pulmonary/ 1st office eval/Marly Schuld  Chief Complaint  Patient presents with  . Pulmonary Consult    Referred by Dr. Evette Cristal for eval of incidental lung mass.   Dyspnea:  Rushing to get across parking lot / steps are ok  Cough: periodic but more since 1.5 year/ mucus is white  At times slt bloody better for "a while" but still feels daytime need to  clear her throat even when non productive Sleep: fine flat  SABA use: none x sev years  Pain is once or twice weekly x months lasting  from a few minutes to a few hours very positional, usually worse sitting.  Late Add hx:  More sweats x several months/ some chills / no purulent sputum for over a year. Dental problems for years last visit was 6 m prior to OV  / feels lump in roof in mouth is back and intermittently drains pus but hasn't had money to return to dentist so originally checked the no dental problems/ procedure box on the entry form.   rec Please remember to go to the lab department   for your tests - we will call you with the results when they are available.  We will call you to set up a PET scan  abdominal pain is  very limited distribution of pain  Treatment consists of avoiding foods that cause gas (especially boiled eggs, mexcican food but especially  beans and undercooked vegetables like  spinach and some salads)  and Citrucel 1 heaping tsp twice daily with  a large glass of water.  Pain should improve w/in 2 weeks and if not then consider further GI work up.    We will arrange follow up according to above results  Add: pt gave hx of dental infection p PET already done > rx omicef (rash only from pen) x 2 weeks then ov with cxr    08/08/2019  f/u ov/Tylor Gambrill re: LLL dense infiltrate ? Aspiration pna vs lung mass  Chief Complaint  Patient presents with  . Follow-up    Patient is here for follow up. Patient states that she feels good overall.   Dyspnea:  Not limited by breathing from desired activities  / steps ok Cough: white mucus, never bloody and not recently  purulent and never putrid  Sleeping: flat / one pillow SABA use: none 02: none  No cp with deep breathing / chills better p abx / occ sweats not soaking    No obvious day to day or daytime variability or assoc excess/ purulent sputum or mucus plugs or hemoptysis or cp or chest tightness, subjective wheeze or overt sinus or hb symptoms.   sleeping without nocturnal  or early am exacerbation  of respiratory  c/o's or need for noct saba. Also denies  any obvious fluctuation of symptoms with weather or environmental changes or other aggravating or alleviating factors except as outlined above   No unusual exposure hx or h/o childhood pna  or knowledge of premature birth.  Current Allergies, Complete Past Medical History, Past Surgical History, Family History, and Social History were reviewed in [employer] record.  ROS  The following are not active complaints unless bolded Hoarseness, sore throat, dysphagia, dental problems, itching, sneezing,  nasal congestion or discharge of excess mucus or purulent secretions, ear ache,   fever, chills, sweats, unintended wt loss or wt gain, classically pleuritic or exertional cp,  orthopnea pnd or arm/hand swelling  or leg swelling, presyncope, palpitations, abdominal pain, anorexia, nausea, vomiting, diarrhea  or change in bowel  habits or change in bladder habits, change in stools or change in urine, dysuria, hematuria,  rash, arthralgias, visual complaints, headache, numbness, weakness or ataxia or problems with walking or coordination,  change in mood or  memory.        Current Meds  Medication Sig  . cefdinir (OMNICEF) 300 MG capsule Take 1 capsule (300 mg total) by mouth 2 (two) times daily x 14 days - completed 08/04/19  . cholecalciferol (VITAMIN D) 1000 units tablet Take 5,000 Units by mouth daily.   Marland Kitchen Specialty Vitamins Products (JOINT/BONE VITALITY PO) Take 1 tablet by mouth daily.  . TURMERIC PO Take 1 each by mouth daily. Tea bag liquid             Past Medical History:  Diagnosis Date  . Allergy   . Anemia   . Arthritis   . Asthma   . Blood transfusion   . Cataract    slight  . Complication of anesthesia    used sodium pentothal and very hard to wake up and arm swelled up from medication  . Constipation   . Gallstones   . GERD (gastroesophageal reflux disease)   . Heart murmur   . Seasonal allergies        Objective:       Pleasant amb bf nad  Wt Readings from Last 3 Encounters:  08/08/19 217 lb 12.8 oz (98.8 kg)  07/11/19 219 lb (99.3 kg)  01/23/18 216 lb (98 kg)     Vital signs reviewed - Note on arrival 02 sats  98% on RA   HEENT : pt wearing mask not removed for exam due to covid -19 concerns.    NECK :  without JVD/Nodes/TM/ nl carotid upstrokes bilaterally   LUNGS: no acc muscle use,  Nl contour chest which is clear to A and P bilaterally without cough on insp or exp maneuvers   CV:  RRR  no s3 or murmur or increase in P2, and no edema   ABD:  Obese soft and nontender with nl inspiratory excursion in the supine position. No bruits or organomegaly appreciated, bowel sounds nl  MS:  Nl gait/ ext warm without deformities, calf tenderness, cyanosis or clubbing No obvious joint restrictions   SKIN: warm and dry without lesions    NEURO:  alert, approp, nl  sensorium with  no motor or cerebellar deficits apparent.        CXR PA and Lateral:   08/08/2019 :    I personally reviewed images and agree with radiology impression as follows:    Masslike opacity in the left lower lobe, highly worrisome for primary bronchogenic carcinoma.     Assessment

## 2019-08-09 ENCOUNTER — Encounter: Payer: Self-pay | Admitting: Internal Medicine

## 2019-08-09 NOTE — Assessment & Plan Note (Signed)
Quit smoking 1989   Incidentally detected on abd ct 07/04/19  for chronic/recurrent  RLQ abd pain with pos air bronchograms with no prior imaging since nl cxr 06/27/13  - Quant TB 07/11/2019  = neg  -  ESR        07/11/2019  =  17  -  PET 07/19/2019  5.4 by 5.2 cm. SUV max equal to 7.25 >  operable setting  - 07/20/2019 Add: pt gave hx of dental infection p PET already done > rx abx x 2 weeks then ov with cxr > done 08/08/2019  "feels better" but cxr no change so rec refer to T surgery   I believe the active dental infections have nothing to do with the Lung lesion and that's why she "feels better" with cxr no change p 2 weeks of omnicef so best to go ahead and get a T surgery eval now and no further abx. Explained may need several procedures to sort out dx and treatment but since this is potentially curable bronchogenic ca best to expedite w/u with one source in this covid restricted era.  Discussed in detail all the  indications, usual  risks and alternatives  relative to the benefits with patient who agrees to proceed with w/u as outlined.    I had an extended discussion with the patient reviewing all relevant studies completed to date and  lasting 15 to 20 minutes of a 25 minute visit    Each maintenance medication was reviewed in detail including most importantly the difference between maintenance and prns and under what circumstances the prns are to be triggered using an action plan format that is not reflected in the computer generated alphabetically organized AVS.     Please see AVS for specific instructions unique to this visit that I personally wrote and verbalized to the the pt in detail and then reviewed with pt  by my nurse highlighting any  changes in therapy recommended at today's visit to their plan of care.

## 2019-08-17 ENCOUNTER — Encounter: Payer: Self-pay | Admitting: Thoracic Surgery (Cardiothoracic Vascular Surgery)

## 2019-08-17 ENCOUNTER — Other Ambulatory Visit: Payer: Self-pay | Admitting: Thoracic Surgery (Cardiothoracic Vascular Surgery)

## 2019-08-17 ENCOUNTER — Institutional Professional Consult (permissible substitution) (INDEPENDENT_AMBULATORY_CARE_PROVIDER_SITE_OTHER): Payer: BC Managed Care – PPO | Admitting: Thoracic Surgery (Cardiothoracic Vascular Surgery)

## 2019-08-17 ENCOUNTER — Other Ambulatory Visit: Payer: Self-pay

## 2019-08-17 VITALS — BP 144/82 | HR 78 | Temp 97.7°F | Resp 20 | Ht 64.0 in | Wt 220.0 lb

## 2019-08-17 DIAGNOSIS — R918 Other nonspecific abnormal finding of lung field: Secondary | ICD-10-CM

## 2019-08-17 NOTE — Progress Notes (Signed)
[address]       [place] [address]             [phone]                    Mairin A [place] Medical Record #[id number] Date of Birth: 03-11-1951  Referring: Tanda Rockers, MD Primary Care: Willey Blade, MD Primary Cardiologist: No primary care provider on file.  Chief Complaint:    Chief Complaint  Patient presents with   Pulmonary Infiltrate    Surgical eval, PET Scan 07/19/19, CTA ABD/Pelvis 07/04/19    History of Present Illness:    Sarah Lee 68 y.o. female referred by Dr. Melvyn Novas for surgical evaluation of a LLL pulmonary mass that was PET avid.  On review of her images from 2007, there was a report that described an incompletely visualized 2cm nodule in the LLL.  She subsequently had a clear CXR from 2014.    She admits to an occasional dry cough, but denies any weight changes.  She regularly has had headaches for much of her life.  She quit smoking in 1990, and worked mostly in Designer, television/film set.  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  She has no 2nd hand smoke exposure.  She is unable to walk up 2 flights of steps due to dyspnea.  Zubrod Score: At the time of surgery this patients most appropriate activity status/level should be described as: []     0    Normal activity, no symptoms [x]     1    Restricted in physical strenuous activity but ambulatory, able to do out light work []     2    Ambulatory and capable of self care, unable to do work activities, up and about               >50 % of waking hours                              []     3    Only limited self care, in bed greater than 50% of waking hours []     4    Completely disabled, no self care, confined to bed or chair []     5    Moribund   Past Medical History:  Diagnosis Date   Allergy    Anemia    Arthritis    Asthma    Blood transfusion    Cataract    slight   Complication of anesthesia    used sodium pentothal and  very hard to wake up and arm swelled up from medication   Constipation    Gallstones    GERD (gastroesophageal reflux disease)    Heart murmur    Seasonal allergies     Past Surgical History:  Procedure Laterality Date   ABDOMINAL HYSTERECTOMY  2004   fibroids--- TAH/BSO   BREAST SURGERY     right breast biopsy-benign   CESAREAN SECTION     CHOLECYSTECTOMY N/A 10/28/2016   Procedure: LAPAROSCOPIC CHOLECYSTECTOMY WITH INTRAOPERATIVE CHOLANGIOGRAM;  Surgeon: Jackolyn Confer, MD;  Location: [facility];  Service: General;  Laterality: N/A;   COLONOSCOPY     ERCP N/A 10/29/2016   Procedure: ENDOSCOPIC RETROGRADE CHOLANGIOPANCREATOGRAPHY (ERCP);  Surgeon: Doran Stabler, MD;  Location: [facility];  Service: Endoscopy;  Laterality: N/A;  POLYPECTOMY     UTERINE FIBROID SURGERY      Family History  Problem Relation Age of Onset   Cancer Father        prostate   Anemia Mother    Stomach cancer Cousin        maternal side    Stomach cancer Maternal Uncle    Colon cancer Neg Hx    Esophageal cancer Neg Hx    Rectal cancer Neg Hx    Colon polyps Neg Hx      Social History   Tobacco Use  Smoking Status Former Smoker   Packs/day: 1.50   Years: 25.00   Pack years: 37.50   Types: Cigarettes   Quit date: 03/02/1988   Years since quitting: 31.4  Smokeless Tobacco Never Used    Social History   Substance and Sexual Activity  Alcohol Use Yes   Comment: wine occassionally     Allergies  Allergen Reactions   Advil [Ibuprofen] Other (See Comments)    Rectal bleeding    Iron Other (See Comments)    Extreme constipation   Penicillins Rash   Tetracyclines & Related Nausea And Vomiting and Anxiety    Current Outpatient Medications  Medication Sig Dispense Refill   Specialty Vitamins Products (JOINT/BONE VITALITY PO) Take 1 tablet by mouth daily.     TURMERIC PO Take 1 each by mouth daily. Tea bag liquid     No current  facility-administered medications for this visit.     Review of Systems  Constitutional: Negative for chills, fever, malaise/fatigue and weight loss.  HENT: Negative.   Eyes: Negative.   Respiratory: Positive for cough and shortness of breath. Negative for sputum production.   Cardiovascular: Negative for chest pain, palpitations, orthopnea and leg swelling.  Gastrointestinal: Negative.   Musculoskeletal: Negative.   Skin: Negative.   Neurological: Positive for headaches.     PHYSICAL EXAMINATION: BP (!) 144/82 (BP Location: Right Arm)    Pulse 78    Temp 97.7 F (36.5 C) (Skin)    Resp 20    Ht 5\' 4"  (1.626 m)    Wt 220 lb (99.8 kg)    LMP 01/17/1994    SpO2 98% Comment: RA   BMI 37.76 kg/m  Physical Exam  Constitutional: She is oriented to person, place, and time. She appears well-developed and well-nourished. No distress.  HENT:  Head: Normocephalic and atraumatic.  Eyes: Conjunctivae are normal. No scleral icterus.  Neck: Normal range of motion. No tracheal deviation present.  Cardiovascular: Normal rate and regular rhythm.  No murmur heard. Respiratory: Effort normal and breath sounds normal. No respiratory distress.  GI: Soft. She exhibits no distension.  Musculoskeletal: Normal range of motion.  Neurological: She is alert and oriented to person, place, and time.  Skin: Skin is warm and dry. She is not diaphoretic.    Diagnostic Studies & Laboratory data:     Recent Radiology Findings:   Leslie's Cxr  Result Date: 08/08/2019 CLINICAL DATA:  Pulmonary infiltrate, asthma. EXAM: CHEST - 2 VIEW COMPARISON:  PET 07/19/2019, CT abdomen pelvis 07/04/2019 and chest radiograph 06/27/2013. FINDINGS: Trachea is midline. Heart size normal. Rounded masslike opacity in the left lower lobe corresponds to that seen on 07/19/2019 and 07/04/2019. Lungs are otherwise clear. No pleural fluid. IMPRESSION: Masslike opacity in the left lower lobe, highly worrisome for primary bronchogenic  carcinoma. Electronically Signed   By: Lorin Picket M.D.   On: 08/08/2019 14:30   Nm Pet Image Initial (pi)  Skull Base To Thigh  Result Date: 07/19/2019 CLINICAL DATA:  Initial treatment strategy for lung mass. EXAM: NUCLEAR MEDICINE PET SKULL BASE TO THIGH TECHNIQUE: 10.6 mCi F-18 FDG was injected intravenously. Full-ring PET imaging was performed from the skull base to thigh after the radiotracer. CT data was obtained and used for attenuation correction and anatomic localization. Fasting blood glucose: 86 mg/dl COMPARISON:  07/04/2019 FINDINGS: Mediastinal blood pool activity: SUV max 4.1 Liver activity: SUV max NA NECK: No hypermetabolic lymph nodes in the neck. Incidental CT findings: none CHEST: No hypermetabolic mediastinal or hilar lymph nodes. Masslike area of architectural distortion within the left lower lobe is again noted. On today's exam this measures 5.4 by 5.2 cm. SUV max associated with this mass is equal to 7.25. No additional pulmonary nodules or masses identified. Incidental CT findings: none ABDOMEN/PELVIS: No abnormal hypermetabolic activity within the liver, pancreas, adrenal glands, or spleen. No hypermetabolic lymph nodes in the abdomen or pelvis. Incidental CT findings: Hysterectomy. SKELETON: No focal hypermetabolic activity to suggest skeletal metastasis. Incidental CT findings: none IMPRESSION: 1. Increased radiotracer uptake is associated with the persistent left lower lobe lung mass. Cannot rule out primary bronchogenic carcinoma. Advise further investigation with tissue sampling. 2. No FDG avid mediastinal or hilar lymph nodes and no evidence for distant metastatic disease. Electronically Signed   By: Kerby Moors M.D.   On: 07/19/2019 16:44       I have independently reviewed the above radiology studies  and reviewed the findings with the patient.   Recent Lab Findings: Lab Results  Component Value Date   WBC 5.2 07/11/2019   HGB 12.4 07/11/2019   HCT 37.4  07/11/2019   PLT 200.0 07/11/2019   GLUCOSE 82 11/03/2016   CHOL 200 02/14/2014   TRIG 140.0 02/14/2014   HDL 55.00 02/14/2014   LDLCALC 117 (H) 02/14/2014   ALT 31 11/30/2016   AST 22 11/30/2016   NA 139 11/03/2016   K 3.3 (L) 11/03/2016   CL 99 (L) 11/03/2016   CREATININE 0.93 11/03/2016   BUN 10 11/03/2016   CO2 32 11/03/2016   TSH 0.86 02/14/2014     PFTs: Pending    Assessment / Plan:   68 yo female with a 5.4cm LLL pulmonary mass with SUV of 7.25.  Based on her history, it remains unclear as to whether this has been present since 2007.  Given the concern for malignancy, I have recommended that she undergo a CT guided biopsy to help guide our treatment plan.  Her case will also be discussed in tumor board.  She will also require PFTs, MRI brain and stress testing if this proves to be a primary lung malignancy.        I  spent 40 minutes with  the patient face to face and greater then 50% of the time was spent in counseling and coordination of care.    [name] 08/17/2019 12:07 PM

## 2019-08-20 ENCOUNTER — Encounter (HOSPITAL_COMMUNITY): Payer: Self-pay | Admitting: Radiology

## 2019-08-20 NOTE — Progress Notes (Signed)
Sarah Lee Female, 68 y.o., [birth year] MRN:  [id number] Phone:  [phone] [name]) PCP:  Willey Blade, MD Primary Cvg:  Sherre Poot [employer] Comm Ppo Next Appt With Radiology ([facility]-CT 3) 09/03/2019 at 8:00 AM  RE: CT Biopsy Received: Today Message Contents  Markus Daft, MD  Arlyn Leak for CT guided biopsy. Looks like patient will be discussed at Tumor conference?    [place]   Previous Messages  ----- Message -----  From: Garth Bigness D  Sent: 08/17/2019  3:18 PM EST  To: Ir Procedure Requests  Subject: CT Biopsy                     Procedure:  CT Biopsy   Reason: Pulmonary infiltrate, left lower lobe mass   History: CT, NM PET in computer   Dr. [name]  [phone]

## 2019-08-23 ENCOUNTER — Other Ambulatory Visit: Payer: Self-pay | Admitting: *Deleted

## 2019-08-23 NOTE — Progress Notes (Signed)
The proposed treatment discussed in cancer conference 08/23/19 is for discussion purpose only and is not a binding recommendation.  The patient was not physically examined nor present for their treatment options.  Therefore, final treatment plans cannot be decided.

## 2019-08-27 ENCOUNTER — Other Ambulatory Visit (HOSPITAL_COMMUNITY)
Admission: RE | Admit: 2019-08-27 | Discharge: 2019-08-27 | Disposition: A | Discharge status: Home / Self Care | Payer: BC Managed Care – PPO | Source: Ambulatory Visit | Attending: Thoracic Surgery (Cardiothoracic Vascular Surgery) | Admitting: Thoracic Surgery (Cardiothoracic Vascular Surgery)

## 2019-08-27 DIAGNOSIS — Z20828 Contact with and (suspected) exposure to other viral communicable diseases: Secondary | ICD-10-CM | POA: Diagnosis not present

## 2019-08-27 DIAGNOSIS — Z01812 Encounter for preprocedural laboratory examination: Secondary | ICD-10-CM | POA: Diagnosis not present

## 2019-08-27 LAB — SARS CORONAVIRUS 2 (TAT 6-24 HRS): SARS Coronavirus 2: NEGATIVE

## 2019-08-28 NOTE — Progress Notes (Signed)
Emailed letter to patient @ [email or web address]

## 2019-08-29 ENCOUNTER — Other Ambulatory Visit: Payer: Self-pay

## 2019-08-29 ENCOUNTER — Ambulatory Visit (HOSPITAL_COMMUNITY)
Admission: RE | Admit: 2019-08-29 | Discharge: 2019-08-29 | Disposition: A | Discharge status: Home / Self Care | Payer: BC Managed Care – PPO | Source: Ambulatory Visit | Attending: Thoracic Surgery (Cardiothoracic Vascular Surgery) | Admitting: Thoracic Surgery (Cardiothoracic Vascular Surgery)

## 2019-08-29 DIAGNOSIS — R918 Other nonspecific abnormal finding of lung field: Secondary | ICD-10-CM | POA: Diagnosis present

## 2019-08-29 LAB — PULMONARY FUNCTION TEST
DL/VA % pred: 130 %
DL/VA: 5.4 ml/min/mmHg/L
DLCO unc % pred: 93 %
DLCO unc: 18.48 ml/min/mmHg
FEF 25-75 Post: 2.72 L/sec
FEF 25-75 Pre: 1.44 L/sec
FEF2575-%Change-Post: 89 %
FEF2575-%Pred-Post: 154 %
FEF2575-%Pred-Pre: 81 %
FEV1-%Change-Post: 19 %
FEV1-%Pred-Post: 97 %
FEV1-%Pred-Pre: 81 %
FEV1-Post: 1.85 L
FEV1-Pre: 1.56 L
FEV1FVC-%Change-Post: 7 %
FEV1FVC-%Pred-Pre: 100 %
FEV6-%Change-Post: 10 %
FEV6-%Pred-Post: 93 %
FEV6-%Pred-Pre: 84 %
FEV6-Post: 2.21 L
FEV6-Pre: 2 L
FEV6FVC-%Pred-Post: 103 %
FEV6FVC-%Pred-Pre: 103 %
FVC-%Change-Post: 10 %
FVC-%Pred-Post: 90 %
FVC-%Pred-Pre: 81 %
FVC-Post: 2.21 L
FVC-Pre: 2 L
Post FEV1/FVC ratio: 84 %
Post FEV6/FVC ratio: 100 %
Pre FEV1/FVC ratio: 78 %
Pre FEV6/FVC Ratio: 100 %
RV % pred: 94 %
RV: 2.07 L
TLC % pred: 80 %
TLC: 4.15 L

## 2019-08-29 MED ORDER — ALBUTEROL SULFATE (2.5 MG/3ML) 0.083% IN NEBU
2.5000 mg | INHALATION_SOLUTION | Freq: Once | RESPIRATORY_TRACT | Status: AC
  Administered 2019-08-29: 16:00:00 2.5 mg via RESPIRATORY_TRACT

## 2019-08-31 ENCOUNTER — Other Ambulatory Visit (HOSPITAL_COMMUNITY)
Admission: RE | Admit: 2019-08-31 | Discharge: 2019-08-31 | Disposition: A | Discharge status: Home / Self Care | Payer: BC Managed Care – PPO | Source: Ambulatory Visit | Attending: Thoracic Surgery (Cardiothoracic Vascular Surgery) | Admitting: Thoracic Surgery (Cardiothoracic Vascular Surgery)

## 2019-08-31 ENCOUNTER — Other Ambulatory Visit: Payer: Self-pay | Admitting: Student

## 2019-08-31 DIAGNOSIS — Z800 Family history of malignant neoplasm of digestive organs: Secondary | ICD-10-CM | POA: Diagnosis not present

## 2019-08-31 DIAGNOSIS — C3432 Malignant neoplasm of lower lobe, left bronchus or lung: Secondary | ICD-10-CM | POA: Diagnosis not present

## 2019-08-31 DIAGNOSIS — Z20828 Contact with and (suspected) exposure to other viral communicable diseases: Secondary | ICD-10-CM | POA: Diagnosis not present

## 2019-08-31 DIAGNOSIS — Z880 Allergy status to penicillin: Secondary | ICD-10-CM | POA: Diagnosis not present

## 2019-08-31 DIAGNOSIS — R918 Other nonspecific abnormal finding of lung field: Secondary | ICD-10-CM | POA: Diagnosis present

## 2019-08-31 DIAGNOSIS — Z90722 Acquired absence of ovaries, bilateral: Secondary | ICD-10-CM | POA: Diagnosis not present

## 2019-08-31 DIAGNOSIS — Z886 Allergy status to analgesic agent status: Secondary | ICD-10-CM | POA: Diagnosis not present

## 2019-08-31 DIAGNOSIS — I2510 Atherosclerotic heart disease of native coronary artery without angina pectoris: Secondary | ICD-10-CM | POA: Diagnosis not present

## 2019-08-31 DIAGNOSIS — Z9049 Acquired absence of other specified parts of digestive tract: Secondary | ICD-10-CM | POA: Diagnosis not present

## 2019-08-31 DIAGNOSIS — Z8042 Family history of malignant neoplasm of prostate: Secondary | ICD-10-CM | POA: Diagnosis not present

## 2019-08-31 DIAGNOSIS — J45909 Unspecified asthma, uncomplicated: Secondary | ICD-10-CM | POA: Diagnosis not present

## 2019-08-31 DIAGNOSIS — Z87891 Personal history of nicotine dependence: Secondary | ICD-10-CM | POA: Diagnosis not present

## 2019-08-31 DIAGNOSIS — Z79899 Other long term (current) drug therapy: Secondary | ICD-10-CM | POA: Diagnosis not present

## 2019-08-31 LAB — SARS CORONAVIRUS 2 (TAT 6-24 HRS): SARS Coronavirus 2: NEGATIVE

## 2019-09-03 ENCOUNTER — Encounter (HOSPITAL_COMMUNITY): Payer: Self-pay

## 2019-09-03 ENCOUNTER — Ambulatory Visit (HOSPITAL_COMMUNITY)
Admission: RE | Admit: 2019-09-03 | Discharge: 2019-09-03 | Disposition: A | Discharge status: Home / Self Care | Payer: BC Managed Care – PPO | Source: Ambulatory Visit | Attending: Interventional Radiology | Admitting: Interventional Radiology

## 2019-09-03 ENCOUNTER — Other Ambulatory Visit: Payer: Self-pay

## 2019-09-03 ENCOUNTER — Ambulatory Visit (HOSPITAL_COMMUNITY)
Admission: RE | Admit: 2019-09-03 | Discharge: 2019-09-03 | Disposition: A | Discharge status: Home / Self Care | Payer: BC Managed Care – PPO | Source: Ambulatory Visit | Attending: Thoracic Surgery (Cardiothoracic Vascular Surgery) | Admitting: Thoracic Surgery (Cardiothoracic Vascular Surgery)

## 2019-09-03 ENCOUNTER — Other Ambulatory Visit: Payer: Self-pay | Admitting: Radiology

## 2019-09-03 ENCOUNTER — Ambulatory Visit (HOSPITAL_COMMUNITY): Admission: RE | Admit: 2019-09-03 | Payer: BC Managed Care – PPO | Source: Ambulatory Visit

## 2019-09-03 DIAGNOSIS — Z800 Family history of malignant neoplasm of digestive organs: Secondary | ICD-10-CM | POA: Insufficient documentation

## 2019-09-03 DIAGNOSIS — J45909 Unspecified asthma, uncomplicated: Secondary | ICD-10-CM | POA: Insufficient documentation

## 2019-09-03 DIAGNOSIS — Z9049 Acquired absence of other specified parts of digestive tract: Secondary | ICD-10-CM | POA: Insufficient documentation

## 2019-09-03 DIAGNOSIS — Z9889 Other specified postprocedural states: Secondary | ICD-10-CM

## 2019-09-03 DIAGNOSIS — C3432 Malignant neoplasm of lower lobe, left bronchus or lung: Secondary | ICD-10-CM | POA: Diagnosis not present

## 2019-09-03 DIAGNOSIS — Z20828 Contact with and (suspected) exposure to other viral communicable diseases: Secondary | ICD-10-CM | POA: Insufficient documentation

## 2019-09-03 DIAGNOSIS — Z87891 Personal history of nicotine dependence: Secondary | ICD-10-CM | POA: Insufficient documentation

## 2019-09-03 DIAGNOSIS — I2510 Atherosclerotic heart disease of native coronary artery without angina pectoris: Secondary | ICD-10-CM | POA: Insufficient documentation

## 2019-09-03 DIAGNOSIS — Z886 Allergy status to analgesic agent status: Secondary | ICD-10-CM | POA: Insufficient documentation

## 2019-09-03 DIAGNOSIS — Z90722 Acquired absence of ovaries, bilateral: Secondary | ICD-10-CM | POA: Insufficient documentation

## 2019-09-03 DIAGNOSIS — Z8042 Family history of malignant neoplasm of prostate: Secondary | ICD-10-CM | POA: Insufficient documentation

## 2019-09-03 DIAGNOSIS — R918 Other nonspecific abnormal finding of lung field: Secondary | ICD-10-CM

## 2019-09-03 DIAGNOSIS — Z880 Allergy status to penicillin: Secondary | ICD-10-CM | POA: Insufficient documentation

## 2019-09-03 DIAGNOSIS — Z79899 Other long term (current) drug therapy: Secondary | ICD-10-CM | POA: Insufficient documentation

## 2019-09-03 LAB — CBC
HCT: 41.2 % (ref 36.0–46.0)
Hemoglobin: 13.2 g/dL (ref 12.0–15.0)
MCH: 28.8 pg (ref 26.0–34.0)
MCHC: 32 g/dL (ref 30.0–36.0)
MCV: 89.8 fL (ref 80.0–100.0)
Platelets: 159 10*3/uL (ref 150–400)
RBC: 4.59 MIL/uL (ref 3.87–5.11)
RDW: 12.5 % (ref 11.5–15.5)
WBC: 5.8 10*3/uL (ref 4.0–10.5)
nRBC: 0 % (ref 0.0–0.2)

## 2019-09-03 LAB — PROTIME-INR
INR: 0.9 (ref 0.8–1.2)
Prothrombin Time: 11.5 seconds (ref 11.4–15.2)

## 2019-09-03 MED ORDER — MIDAZOLAM HCL 2 MG/2ML IJ SOLN
INTRAMUSCULAR | Status: AC | PRN
  Administered 2019-09-03: 1 mg via INTRAVENOUS
  Administered 2019-09-03: 0.5 mg via INTRAVENOUS

## 2019-09-03 MED ORDER — FENTANYL CITRATE (PF) 100 MCG/2ML IJ SOLN
INTRAMUSCULAR | Status: AC
  Filled 2019-09-03: qty 2

## 2019-09-03 MED ORDER — MIDAZOLAM HCL 2 MG/2ML IJ SOLN
INTRAMUSCULAR | Status: AC
  Filled 2019-09-03: qty 2

## 2019-09-03 MED ORDER — LIDOCAINE HCL 1 % IJ SOLN
INTRAMUSCULAR | Status: AC
  Filled 2019-09-03: qty 20

## 2019-09-03 MED ORDER — SODIUM CHLORIDE 0.9 % IV SOLN: INTRAVENOUS | Status: DC

## 2019-09-03 MED ORDER — FENTANYL CITRATE (PF) 100 MCG/2ML IJ SOLN
INTRAMUSCULAR | Status: AC | PRN
  Administered 2019-09-03: 25 ug via INTRAVENOUS
  Administered 2019-09-03: 50 ug via INTRAVENOUS

## 2019-09-03 NOTE — H&P (Signed)
Chief Complaint: Patient was seen in consultation today for left lung mass biopsy at the request of [facility]  Referring Physician(s): [facility]  Supervising Physician: Jacqulynn Cadet  Patient Status: [facility] - Out-pt  History of Present Illness: Sarah Lee is a 68 y.o. female   Followed by Dr Melvyn Novas for LLL mass Initially seen 2007- incompletely visualized per imaging Clear CXR 2014  Stopped smoking 1990   CT 07/04/19 for abd pain: IMPRESSION: 1. No acute process in the abdomen or pelvis. No explanation for right lower quadrant pain, nausea, or constipation. 2. Left lower lobe masslike consolidation. New since remote chest radiograph. Correlate with infectious symptoms. Especially if no infectious symptoms, consider multidisciplinary thoracic oncology consultation for possible PET. 3. Coronary artery atherosclerosis.  PET 07/19/19: IMPRESSION: 1. Increased radiotracer uptake is associated with the persistent left lower lobe lung mass. Cannot rule out primary bronchogenic carcinoma. Advise further investigation with tissue sampling. 2. No FDG avid mediastinal or hilar lymph nodes and no evidence for distant metastatic disease.  CXR 08/08/19 for dyspnea IMPRESSION: Masslike opacity in the left lower lobe, highly worrisome for primary bronchogenic carcinoma.  Was referred to Dr Kipp Brood for evaluation/management of mass  Assessment / Plan per Dr Kipp Brood 08/17/19: 68 yo female with a 5.4cm LLL pulmonary mass with SUV of 7.25.  Based on her history, it remains unclear as to whether this has been present since 2007.  Given the concern for malignancy, I have recommended that she undergo a CT guided biopsy to help guide our treatment plan.  Her case will also be discussed in tumor board.  She will also require PFTs, MRI brain and stress testing if this proves to be a primary lung malignancy.    Scheduled now for LLL mass biopsy  Past Medical  History:  Diagnosis Date  . Allergy   . Anemia   . Arthritis   . Asthma   . Blood transfusion   . Cataract    slight  . Complication of anesthesia    used sodium pentothal and very hard to wake up and arm swelled up from medication  . Constipation   . Gallstones   . GERD (gastroesophageal reflux disease)   . Heart murmur   . Seasonal allergies     Past Surgical History:  Procedure Laterality Date  . ABDOMINAL HYSTERECTOMY  2004   fibroids--- TAH/BSO  . BREAST SURGERY     right breast biopsy-benign  . CESAREAN SECTION    . CHOLECYSTECTOMY N/A 10/28/2016   Procedure: LAPAROSCOPIC CHOLECYSTECTOMY WITH INTRAOPERATIVE CHOLANGIOGRAM;  Surgeon: Jackolyn Confer, MD;  Location: [facility] ORS;  Service: General;  Laterality: N/A;  . COLONOSCOPY    . ERCP N/A 10/29/2016   Procedure: ENDOSCOPIC RETROGRADE CHOLANGIOPANCREATOGRAPHY (ERCP);  Surgeon: Doran Stabler, MD;  Location: [facility];  Service: Endoscopy;  Laterality: N/A;  . POLYPECTOMY    . UTERINE FIBROID SURGERY      Allergies: Advil [ibuprofen], Iron, Penicillins, and Tetracyclines & related  Medications: Prior to Admission medications   Medication Sig Start Date End Date Taking? Authorizing Provider  Acetylcysteine (N-ACETYL-L-CYSTEINE PO) Take by mouth.   Yes [provider]  cholecalciferol (VITAMIN D3) 25 MCG (1000 UT) tablet Take 10,000 Units by mouth daily.   Yes [provider]  Magnesium 200 MG TABS Take by mouth.   Yes [provider]  TURMERIC PO Take 1 each by mouth daily. Tea bag liquid   Yes [provider]  zinc gluconate 50 MG  tablet Take 30 mg by mouth daily.   Yes [provider]  Specialty Vitamins Products (JOINT/BONE VITALITY PO) Take 1 tablet by mouth daily.    [provider]     Family History  Problem Relation Age of Onset  . Cancer Father        prostate  . Anemia Mother   . Stomach cancer Cousin        maternal side   . Stomach cancer  Maternal Uncle   . Colon cancer Neg Hx   . Esophageal cancer Neg Hx   . Rectal cancer Neg Hx   . Colon polyps Neg Hx     Social History   Socioeconomic History  . Marital status: Married    Spouse name: Not on file  . Number of children: Not on file  . Years of education: Not on file  . Highest education level: Not on file  Occupational History    Comment: unemployed  Tobacco Use  . Smoking status: Former Smoker    Packs/day: 1.50    Years: 25.00    Pack years: 37.50    Types: Cigarettes    Quit date: 03/02/1988    Years since quitting: 31.5  . Smokeless tobacco: Never Used  Substance and Sexual Activity  . Alcohol use: Yes    Comment: wine occassionally  . Drug use: Never  . Sexual activity: Not Currently  Other Topics Concern  . Not on file  Social History Narrative   Exercise--  no   Social Determinants of Health   Financial Resource Strain:   . Difficulty of Paying Living Expenses: Not on file  Food Insecurity:   . Worried About Charity fundraiser in the Last Year: Not on file  . Ran Out of Food in the Last Year: Not on file  Transportation Needs:   . Lack of Transportation (Medical): Not on file  . Lack of Transportation (Non-Medical): Not on file  Physical Activity:   . Days of Exercise per Week: Not on file  . Minutes of Exercise per Session: Not on file  Stress:   . Feeling of Stress : Not on file  Social Connections:   . Frequency of Communication with Friends and Family: Not on file  . Frequency of Social Gatherings with Friends and Family: Not on file  . Attends Religious Services: Not on file  . Active Member of Clubs or Organizations: Not on file  . Attends Archivist Meetings: Not on file  . Marital Status: Not on file   Review of Systems: A 12 point ROS discussed and pertinent positives are indicated in the HPI above.  All other systems are negative.  Review of Systems  Constitutional: Negative for activity change, fatigue, fever  and unexpected weight change.  Respiratory: Positive for shortness of breath. Negative for cough and wheezing.   Gastrointestinal: Negative for abdominal pain.  Neurological: Negative for weakness.  Psychiatric/Behavioral: Negative for behavioral problems and confusion.    Vital Signs: BP 120/69   Pulse 73   Temp 97.9 F (36.6 C) (Oral)   Resp 16   Ht 5' 4.5" (1.638 m)   Wt 215 lb (97.5 kg)   LMP 01/17/1994   SpO2 100%   BMI 36.33 kg/m   Physical Exam Vitals reviewed.  Cardiovascular:     Rate and Rhythm: Normal rate and regular rhythm.     Heart sounds: Normal heart sounds.  Pulmonary:     Effort: Pulmonary effort is  normal.     Breath sounds: Normal breath sounds.  Abdominal:     Palpations: Abdomen is soft.  Musculoskeletal:        General: Normal range of motion.  Skin:    General: Skin is warm and dry.  Neurological:     Mental Status: She is alert and oriented to person, place, and time.  Psychiatric:        Mood and Affect: Mood normal.        Behavior: Behavior normal.        Thought Content: Thought content normal.        Judgment: Judgment normal.     Imaging: Sarah Lee's cxr  Result Date: 08/08/2019 CLINICAL DATA:  Pulmonary infiltrate, asthma. EXAM: CHEST - 2 VIEW COMPARISON:  PET 07/19/2019, CT abdomen pelvis 07/04/2019 and chest radiograph 06/27/2013. FINDINGS: Trachea is midline. Heart size normal. Rounded masslike opacity in the left lower lobe corresponds to that seen on 07/19/2019 and 07/04/2019. Lungs are otherwise clear. No pleural fluid. IMPRESSION: Masslike opacity in the left lower lobe, highly worrisome for primary bronchogenic carcinoma. Electronically Signed   By: Lorin Picket M.D.   On: 08/08/2019 14:30    Labs:  CBC: Recent Labs    07/11/19 1137 09/03/19 0610  WBC 5.2 5.8  HGB 12.4 13.2  HCT 37.4 41.2  PLT 200.0 159    COAGS: Recent Labs    09/03/19 0610  INR 0.9    BMP: No results for input(s): NA, K, CL, CO2,  GLUCOSE, BUN, CALCIUM, CREATININE, GFRNONAA, GFRAA in the last 8760 hours.  Invalid input(s): CMP  LIVER FUNCTION TESTS: No results for input(s): BILITOT, AST, ALT, ALKPHOS, PROT, ALBUMIN in the last 8760 hours.  TUMOR MARKERS: No results for input(s): AFPTM, CEA, CA199, CHROMGRNA in the last 8760 hours.  Assessment and Plan:  LLL mass; +PET Scheduled for LLL mass biopsy Risks and benefits of CT guided lung nodule biopsy was discussed with the patient including, but not limited to bleeding, hemoptysis, respiratory failure requiring intubation, infection, pneumothorax requiring chest tube placement, stroke from air embolism or even death.  All of the patient's questions were answered and the patient is agreeable to proceed. Consent signed and in chart.   Thank you for this interesting consult.  I greatly enjoyed meeting NAOMII KREGER and look forward to participating in their care.  A copy of this report was sent to the requesting provider on this date.  Electronically Signed: Lavonia Drafts, PA-C 09/03/2019, 7:06 AM   I spent a total of  30 Minutes   in face to face in clinical consultation, greater than 50% of which was counseling/coordinating care for LLL mass bx

## 2019-09-03 NOTE — Discharge Instructions (Addendum)
Lung Biopsy, Care After This sheet gives you information about how to care for yourself after your procedure. Your health care provider may also give you more specific instructions depending on the type of biopsy you had. If you have problems or questions, contact your health care provider. What can I expect after the procedure? After the procedure, it is common to have:  A cough.  A sore throat.  Pain where a needle, bronchoscope, or incision was used to collect a biopsy sample (biopsy site). Follow these instructions at home: Medicines  Take over-the-counter and prescription medicines only as told by your health care provider.  Do not drink alcohol if your health care provider tells you not to drink.  Ask your health care provider if the medicine prescribed to you: ? Requires you to avoid driving or using heavy machinery. ? Can cause constipation. You may need to take these actions to prevent or treat constipation:  Drink enough fluid to keep your urine pale yellow.  Take over-the-counter or prescription medicines.  Eat foods that are high in fiber, such as beans, whole grains, and fresh fruits and vegetables.  Limit foods that are high in fat and processed sugars, such as fried or sweet foods.  Do not drive for 24 hours if you were given a sedative. Biopsy site care   Follow instructions from your health care provider about how to take care of your biopsy site. Make sure you: ? Wash your hands with soap and water before and after you change your bandage (dressing). If soap and water are not available, use hand sanitizer. ? Change your dressing as told by your health care provider. ? Leave stitches (sutures), skin glue, or adhesive strips in place. These skin closures may need to stay in place for 2 weeks or longer. If adhesive strip edges start to loosen and curl up, you may trim the loose edges. Do not remove adhesive strips completely unless your health care provider tells  you to do that.  Do not take baths, swim, or use a hot tub until your health care provider approves. Ask your health care provider if you may take showers. You may only be allowed to take sponge baths.  Check your biopsy site every day for signs of infection. Check for: ? Redness, swelling, or more pain. ? Fluid or blood. ? Warmth. ? Pus or a bad smell. General instructions  Return to your normal activities as told by your health care provider. Ask your health care provider what activities are safe for you.  It is up to you to get the results of your procedure. Ask your health care provider, or the department that is doing the procedure, when your results will be ready.  Keep all follow-up visits as told by your health care provider. This is important. Contact a health care provider if:  You have a fever.  You have redness, swelling, or more pain around your biopsy site.  You have fluid or blood coming from your biopsy site.  Your biopsy site feels warm to the touch.  You have pus or a bad smell coming from your biopsy site.  You have pain that does not get better with medicine. Get help right away if:  You cough up blood.  You have trouble breathing.  You have chest pain.  You lose consciousness. Summary  After the procedure, it is common to have a sore throat and a cough.  Return to your normal activities as told by your  health care provider. Ask your health care provider what activities are safe for you.  Take over-the-counter and prescription medicines only as told by your health care provider.  Report any unusual symptoms to your health care provider. This information is not intended to replace advice given to you by your health care provider. Make sure you discuss any questions you have with your health care provider. Document Released: 09/28/2016 Document Revised: 10/04/2018 Document Reviewed: 09/28/2016 Elsevier Patient Education  [place]. Moderate  Conscious Sedation, Adult, Care After These instructions provide you with information about caring for yourself after your procedure. Your health care provider may also give you more specific instructions. Your treatment has been planned according to current medical practices, but problems sometimes occur. Call your health care provider if you have any problems or questions after your procedure. What can I expect after the procedure? After your procedure, it is common:  To feel sleepy for several hours.  To feel clumsy and have poor balance for several hours.  To have poor judgment for several hours.  To vomit if you eat too soon. Follow these instructions at home: For at least 24 hours after the procedure:   Do not: ? Participate in activities where you could fall or become injured. ? Drive. ? Use heavy machinery. ? Drink alcohol. ? Take sleeping pills or medicines that cause drowsiness. ? Make important decisions or sign legal documents. ? Take care of children on your own.  Rest. Eating and drinking  Follow the diet recommended by your health care provider.  If you vomit: ? Drink water, juice, or soup when you can drink without vomiting. ? Make sure you have little or no nausea before eating solid foods. General instructions  Have a responsible adult stay with you until you are awake and alert.  Take over-the-counter and prescription medicines only as told by your health care provider.  If you smoke, do not smoke without supervision.  Keep all follow-up visits as told by your health care provider. This is important. Contact a health care provider if:  You keep feeling nauseous or you keep vomiting.  You feel light-headed.  You develop a rash.  You have a fever. Get help right away if:  You have trouble breathing. This information is not intended to replace advice given to you by your health care provider. Make sure you discuss any questions you have with your  health care provider. Document Released: 06/20/2013 Document Revised: 08/12/2017 Document Reviewed: 12/20/2015 Elsevier Patient Education  2020 [employer].

## 2019-09-03 NOTE — Procedures (Signed)
Interventional Radiology Procedure Note  Procedure: CT guided biopsy of LLL pulmonary mass-like consolidation.  Complications: No immediate Recommendations: - Bedrest until CXR cleared.  Minimize talking, coughing or otherwise straining.  - Follow up 2 hr CXR pending   Signed,  Criselda Peaches, MD

## 2019-09-03 NOTE — Progress Notes (Addendum)
Discharge instructions reviewed with pt and her friend Maudie Mercury both voice understanding. Ambulated in hallway and room tol well

## 2019-09-04 LAB — ACID FAST SMEAR (AFB, MYCOBACTERIA): Acid Fast Smear: NEGATIVE

## 2019-09-04 LAB — SURGICAL PATHOLOGY

## 2019-09-18 ENCOUNTER — Encounter: Payer: Self-pay | Admitting: Thoracic Surgery (Cardiothoracic Vascular Surgery)

## 2019-09-18 ENCOUNTER — Ambulatory Visit (INDEPENDENT_AMBULATORY_CARE_PROVIDER_SITE_OTHER): Payer: BC Managed Care – PPO | Admitting: Thoracic Surgery (Cardiothoracic Vascular Surgery)

## 2019-09-18 ENCOUNTER — Encounter: Payer: Self-pay | Admitting: *Deleted

## 2019-09-18 ENCOUNTER — Other Ambulatory Visit: Payer: Self-pay | Admitting: *Deleted

## 2019-09-18 ENCOUNTER — Other Ambulatory Visit: Payer: Self-pay

## 2019-09-18 VITALS — BP 144/84 | HR 82 | Temp 97.3°F | Resp 20 | Ht 64.5 in | Wt 212.5 lb

## 2019-09-18 DIAGNOSIS — C3490 Malignant neoplasm of unspecified part of unspecified bronchus or lung: Secondary | ICD-10-CM

## 2019-09-18 DIAGNOSIS — R918 Other nonspecific abnormal finding of lung field: Secondary | ICD-10-CM | POA: Diagnosis not present

## 2019-09-18 DIAGNOSIS — Z01818 Encounter for other preprocedural examination: Secondary | ICD-10-CM

## 2019-09-18 NOTE — Progress Notes (Signed)
[address]       [place] [address]             [phone]                    Sarah Lee [place] Medical Record #[id number] Date of Birth: 16-Feb-1951  Referring: Tanda Rockers, MD Primary Care: Willey Blade, MD Primary Cardiologist: No primary care provider on file.  Chief Complaint:    Chief Complaint  Patient presents with   Follow-up    LLL mass    History of Present Illness:    Sarah Lee 69 y.o. female come to discuss biopsy results.  He pathology did show adenocarcinoma.  She has no complaints today.       Per my last note: Sarah Lee 69 y.o. female referred by Dr. Melvyn Novas for surgical evaluation of a LLL pulmonary mass that was PET avid.  On review of her images from 2007, there was a report that described an incompletely visualized 2cm nodule in the LLL.  She subsequently had a clear CXR from 2014.    She admits to an occasional dry cough, but denies any weight changes.  She regularly has had headaches for much of her life.  She quit smoking in 1990, and worked mostly in Designer, television/film set.  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  She has no 2nd hand smoke exposure.  She is unable to walk up 2 flights of steps due to dyspnea.    Zubrod Score: At the time of surgery this patients most appropriate activity status/level should be described as: []     0    Normal activity, no symptoms []     1    Restricted in physical strenuous activity but ambulatory, able to do out light work [x]     2    Ambulatory and capable of self care, unable to do work activities, up and about               >50 % of waking hours                              []     3    Only limited self care, in bed greater than 50% of waking hours []     4    Completely disabled, no self care, confined to bed or chair []     5    Moribund   Past Medical History:  Diagnosis Date   Allergy    Anemia    Arthritis    Asthma     Blood transfusion    Cataract    slight   Complication of anesthesia    used sodium pentothal and very hard to wake up and arm swelled up from medication   Constipation    Gallstones    GERD (gastroesophageal reflux disease)    Heart murmur    Seasonal allergies     Past Surgical History:  Procedure Laterality Date   ABDOMINAL HYSTERECTOMY  2004   fibroids--- TAH/BSO   BREAST SURGERY     right breast biopsy-benign   CESAREAN SECTION     CHOLECYSTECTOMY N/A 10/28/2016   Procedure: LAPAROSCOPIC CHOLECYSTECTOMY WITH INTRAOPERATIVE CHOLANGIOGRAM;  Surgeon: Jackolyn Confer, MD;  Location: [facility];  Service: General;  Laterality: N/A;   COLONOSCOPY     ERCP  N/A 10/29/2016   Procedure: ENDOSCOPIC RETROGRADE CHOLANGIOPANCREATOGRAPHY (ERCP);  Surgeon: Doran Stabler, MD;  Location: [facility];  Service: Endoscopy;  Laterality: N/A;   POLYPECTOMY     UTERINE FIBROID SURGERY      Family History  Problem Relation Age of Onset   Cancer Father        prostate   Anemia Mother    Stomach cancer Cousin        maternal side    Stomach cancer Maternal Uncle    Colon cancer Neg Hx    Esophageal cancer Neg Hx    Rectal cancer Neg Hx    Colon polyps Neg Hx      Social History   Tobacco Use  Smoking Status Former Smoker   Packs/day: 1.50   Years: 25.00   Pack years: 37.50   Types: Cigarettes   Quit date: 03/02/1988   Years since quitting: 31.5  Smokeless Tobacco Never Used    Social History   Substance and Sexual Activity  Alcohol Use Yes   Comment: wine occassionally     Allergies  Allergen Reactions   Advil [Ibuprofen] Other (See Comments)    Rectal bleeding    Iron Other (See Comments)    Extreme constipation   Penicillins Rash   Tetracyclines & Related Nausea And Vomiting and Anxiety    Current Outpatient Medications  Medication Sig Dispense Refill   Acetylcysteine (N-ACETYL-L-CYSTEINE PO) Take by mouth.     cholecalciferol  (VITAMIN D3) 25 MCG (1000 UT) tablet Take 10,000 Units by mouth daily.     Magnesium 200 MG TABS Take by mouth.     Specialty Vitamins Products (JOINT/BONE VITALITY PO) Take 1 tablet by mouth daily.     TURMERIC PO Take 1 each by mouth daily. Tea bag liquid     zinc gluconate 50 MG tablet Take 30 mg by mouth daily.     No current facility-administered medications for this visit.      PHYSICAL EXAMINATION: BP (!) 144/84 (BP Location: Right Arm)    Pulse 82    Temp (!) 97.3 F (36.3 C) (Skin)    Resp 20    Ht 5' 4.5" (1.638 m)    Wt 212 lb 8 oz (96.4 kg)    LMP 01/17/1994    SpO2 95%    BMI 35.91 kg/m  Physical Exam  Constitutional: She is oriented to person, place, and time. She appears well-developed and well-nourished. No distress.  HENT:  Head: Normocephalic.  Eyes: No scleral icterus.  Neck: No tracheal deviation present.  Cardiovascular: Normal rate.  Respiratory: Effort normal. No respiratory distress.  GI: Soft. She exhibits no distension.  Musculoskeletal:        General: Normal range of motion.     Cervical back: Normal range of motion.  Neurological: She is alert and oriented to person, place, and time.  Skin: Skin is warm and dry. She is not diaphoretic.    Diagnostic Studies & Laboratory data:     Recent Radiology Findings:   CT BIOPSY  Result Date: 09/03/2019 INDICATION: 69 year old female with a persistent and hypermetabolic left lower lobe region of masslike consolidation. She presents for CT-guided biopsy. EXAM: CT-guided biopsy left lower lobe masslike consolidation Interventional Radiologist:  Criselda Peaches, MD MEDICATIONS: None. ANESTHESIA/SEDATION: Fentanyl 75 mcg IV; Versed 1.5 mg IV Moderate Sedation Time:  15 minutes The patient was continuously monitored during the procedure by the interventional radiology nurse under my direct supervision. FLUOROSCOPY TIME:  None COMPLICATIONS: None immediate. Estimated blood loss:  0 PROCEDURE: Informed written  consent was obtained from the patient after a thorough discussion of the procedural risks, benefits and alternatives. All questions were addressed. Maximal Sterile Barrier Technique was utilized including caps, mask, sterile gowns, sterile gloves, sterile drape, hand hygiene and skin antiseptic. A timeout was performed prior to the initiation of the procedure. A planning axial CT scan was performed. The region of masslike consolidation in the left lower lobe was successfully identified. A suitable skin entry site was selected and marked. The region was then sterilely prepped and draped in standard fashion using Betadine skin prep. Local anesthesia was attained by infiltration with 1% lidocaine. A small dermatotomy was made. Under intermittent CT fluoroscopic guidance, a 17 gauge trocar needle was advanced into the lung and positioned at the margin of the nodule. Multiple 18 gauge core biopsies were then coaxially obtained using the BioPince automated biopsy device. Biopsy specimens were placed in formalin and delivered to pathology for further analysis. A bio sentry device was deployed. Post biopsy axial CT imaging demonstrates no evidence of immediate complication. There is no pneumothorax. Mild perilesional alveolar hemorrhage is not unexpected. The patient tolerated the procedure well. IMPRESSION: Technically successful CT-guided biopsy left lower lobe region of masslike consolidation. Biopsy specimens will be sent for surgical pathology as well as fungal and AFB culture. Signed, Criselda Peaches, MD, [facility] Electronically Signed   By: Jacqulynn Cadet M.D.   On: 09/03/2019 09:36   DG Chest Port 1 View  Result Date: 09/03/2019 CLINICAL DATA:  Post lung BX EXAM: PORTABLE CHEST 1 VIEW COMPARISON:  Radiograph 08/08/2019, PET-CT 07/19/2019 FINDINGS: Normal cardiac silhouette. LEFT lower lobe mass again demonstrated. No pneumothorax post  biopsy. No pulmonary hemorrhage. IMPRESSION: 1. No complication following biopsy. 2. LEFT lower lobe lung mass. Electronically Signed   By: Suzy Bouchard M.D.   On: 09/03/2019 11:22       I have independently reviewed the above radiology studies  and reviewed the findings with the patient.   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  PET CT did not show any lymph node avidity.  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  She will undergo an MRI brain, and stress test.  She is tentatively scheduled for 10/01/19 for a bronchoscopy, and robotic assisted left VATS, left lower lobectomy.       I  spent 20 minutes with  the patient face to face and greater then 50% of the time was spent in counseling and coordination of care.    [name] 09/18/2019 1:06 PM

## 2019-09-19 ENCOUNTER — Telehealth (HOSPITAL_COMMUNITY): Payer: Self-pay | Admitting: *Deleted

## 2019-09-19 ENCOUNTER — Ambulatory Visit
Admission: RE | Admit: 2019-09-19 | Discharge: 2019-09-19 | Disposition: A | Discharge status: Home / Self Care | Payer: BC Managed Care – PPO | Source: Ambulatory Visit | Attending: Thoracic Surgery (Cardiothoracic Vascular Surgery) | Admitting: Thoracic Surgery (Cardiothoracic Vascular Surgery)

## 2019-09-19 DIAGNOSIS — C3490 Malignant neoplasm of unspecified part of unspecified bronchus or lung: Secondary | ICD-10-CM

## 2019-09-19 MED ORDER — GADOBENATE DIMEGLUMINE 529 MG/ML IV SOLN
20.0000 mL | Freq: Once | INTRAVENOUS | Status: AC | PRN
  Administered 2019-09-19: 20 mL via INTRAVENOUS

## 2019-09-19 NOTE — Telephone Encounter (Signed)
Patient given detailed instructions per Myocardial Perfusion Study Information Sheet for the test on 09/25/19 at 8:15. Patient notified to arrive 15 minutes early and that it is imperative to arrive on time for appointment to keep from having the test rescheduled.  If you need to cancel or reschedule your appointment, please call the office within 24 hours of your appointment. . Patient verbalized understanding.[name]

## 2019-09-25 ENCOUNTER — Other Ambulatory Visit: Payer: Self-pay | Admitting: *Deleted

## 2019-09-25 ENCOUNTER — Ambulatory Visit (HOSPITAL_COMMUNITY): Payer: BC Managed Care – PPO | Attending: Internal Medicine

## 2019-09-25 ENCOUNTER — Other Ambulatory Visit: Payer: Self-pay

## 2019-09-25 VITALS — Ht 64.5 in | Wt 212.0 lb

## 2019-09-25 DIAGNOSIS — C3490 Malignant neoplasm of unspecified part of unspecified bronchus or lung: Secondary | ICD-10-CM | POA: Diagnosis not present

## 2019-09-25 DIAGNOSIS — Z01818 Encounter for other preprocedural examination: Secondary | ICD-10-CM

## 2019-09-25 DIAGNOSIS — Z01810 Encounter for preprocedural cardiovascular examination: Secondary | ICD-10-CM | POA: Diagnosis present

## 2019-09-25 DIAGNOSIS — R918 Other nonspecific abnormal finding of lung field: Secondary | ICD-10-CM

## 2019-09-25 DIAGNOSIS — J45909 Unspecified asthma, uncomplicated: Secondary | ICD-10-CM | POA: Insufficient documentation

## 2019-09-25 MED ORDER — REGADENOSON 0.4 MG/5ML IV SOLN
0.4000 mg | Freq: Once | INTRAVENOUS | Status: AC
  Administered 2019-09-25: 0.4 mg via INTRAVENOUS

## 2019-09-25 MED ORDER — TECHNETIUM TC 99M TETROFOSMIN IV KIT
33.0000 | PACK | Freq: Once | INTRAVENOUS | Status: AC | PRN
  Administered 2019-09-25: 33 via INTRAVENOUS
  Filled 2019-09-25: qty 33

## 2019-09-26 ENCOUNTER — Ambulatory Visit (HOSPITAL_COMMUNITY): Payer: BC Managed Care – PPO | Attending: Cardiovascular Disease

## 2019-09-26 LAB — MYOCARDIAL PERFUSION IMAGING
LV dias vol: 71 mL (ref 46–106)
LV sys vol: 20 mL
Peak HR: 108 {beats}/min
Rest HR: 71 {beats}/min
SDS: 1
SRS: 1
SSS: 2
TID: 0.95

## 2019-09-26 MED ORDER — TECHNETIUM TC 99M TETROFOSMIN IV KIT
30.2000 | PACK | Freq: Once | INTRAVENOUS | Status: AC | PRN
  Administered 2019-09-26: 30.2 via INTRAVENOUS
  Filled 2019-09-26: qty 31

## 2019-09-27 ENCOUNTER — Other Ambulatory Visit (HOSPITAL_COMMUNITY)
Admission: RE | Admit: 2019-09-27 | Discharge: 2019-09-27 | Disposition: A | Discharge status: Home / Self Care | Payer: BC Managed Care – PPO | Source: Ambulatory Visit | Attending: Thoracic Surgery (Cardiothoracic Vascular Surgery) | Admitting: Thoracic Surgery (Cardiothoracic Vascular Surgery)

## 2019-09-27 ENCOUNTER — Other Ambulatory Visit: Payer: Self-pay

## 2019-09-27 ENCOUNTER — Encounter (HOSPITAL_COMMUNITY): Payer: Self-pay

## 2019-09-27 ENCOUNTER — Encounter (HOSPITAL_COMMUNITY)
Admission: RE | Admit: 2019-09-27 | Discharge: 2019-09-27 | Disposition: A | Discharge status: Home / Self Care | Payer: BC Managed Care – PPO | Source: Ambulatory Visit | Attending: Thoracic Surgery (Cardiothoracic Vascular Surgery) | Admitting: Thoracic Surgery (Cardiothoracic Vascular Surgery)

## 2019-09-27 DIAGNOSIS — Z01818 Encounter for other preprocedural examination: Secondary | ICD-10-CM | POA: Insufficient documentation

## 2019-09-27 DIAGNOSIS — R918 Other nonspecific abnormal finding of lung field: Secondary | ICD-10-CM | POA: Diagnosis not present

## 2019-09-27 HISTORY — DX: Malignant (primary) neoplasm, unspecified: C80.1

## 2019-09-27 HISTORY — DX: Pneumonia, unspecified organism: J18.9

## 2019-09-27 LAB — TYPE AND SCREEN
ABO/RH(D): A POS
Antibody Screen: NEGATIVE

## 2019-09-27 LAB — CBC
HCT: 40.8 % (ref 36.0–46.0)
Hemoglobin: 13.1 g/dL (ref 12.0–15.0)
MCH: 29.4 pg (ref 26.0–34.0)
MCHC: 32.1 g/dL (ref 30.0–36.0)
MCV: 91.7 fL (ref 80.0–100.0)
Platelets: 152 10*3/uL (ref 150–400)
RBC: 4.45 MIL/uL (ref 3.87–5.11)
RDW: 12.6 % (ref 11.5–15.5)
WBC: 3.7 10*3/uL — ABNORMAL LOW (ref 4.0–10.5)
nRBC: 0 % (ref 0.0–0.2)

## 2019-09-27 LAB — COMPREHENSIVE METABOLIC PANEL
ALT: 27 U/L (ref 0–44)
AST: 26 U/L (ref 15–41)
Albumin: 3.8 g/dL (ref 3.5–5.0)
Alkaline Phosphatase: 67 U/L (ref 38–126)
Anion gap: 10 (ref 5–15)
BUN: 9 mg/dL (ref 8–23)
CO2: 22 mmol/L (ref 22–32)
Calcium: 9.6 mg/dL (ref 8.9–10.3)
Chloride: 108 mmol/L (ref 98–111)
Creatinine, Ser: 0.65 mg/dL (ref 0.44–1.00)
GFR calc Af Amer: >60 mL/min (ref >60)
GFR calc non Af Amer: >60 mL/min (ref >60)
Glucose, Bld: 90 mg/dL (ref 70–99)
Potassium: 4.1 mmol/L (ref 3.5–5.1)
Sodium: 140 mmol/L (ref 135–145)
Total Bilirubin: 1 mg/dL (ref 0.3–1.2)
Total Protein: 6.7 g/dL (ref 6.5–8.1)

## 2019-09-27 LAB — URINALYSIS, ROUTINE W REFLEX MICROSCOPIC
Bilirubin Urine: NEGATIVE
Glucose, UA: NEGATIVE mg/dL
Hgb urine dipstick: NEGATIVE
Ketones, ur: NEGATIVE mg/dL
Leukocytes,Ua: NEGATIVE
Nitrite: NEGATIVE
Protein, ur: NEGATIVE mg/dL
Specific Gravity, Urine: 1.021 (ref 1.005–1.030)
pH: 5 (ref 5.0–8.0)

## 2019-09-27 LAB — BLOOD GAS, ARTERIAL
Acid-Base Excess: 0.9 mmol/L (ref 0.0–2.0)
Bicarbonate: 24.7 mmol/L (ref 20.0–28.0)
Drawn by: 421801
FIO2: 21
O2 Saturation: 98.6 %
Patient temperature: 37
pCO2 arterial: 37.4 mmHg (ref 32.0–48.0)
pH, Arterial: 7.435 (ref 7.350–7.450)
pO2, Arterial: 108 mmHg (ref 83.0–108.0)

## 2019-09-27 LAB — APTT: aPTT: 27 seconds (ref 24–36)

## 2019-09-27 LAB — ABO/RH: ABO/RH(D): A POS

## 2019-09-27 LAB — SURGICAL PCR SCREEN
MRSA, PCR: NEGATIVE
Staphylococcus aureus: NEGATIVE

## 2019-09-27 LAB — PROTIME-INR
INR: 1 (ref 0.8–1.2)
Prothrombin Time: 12.6 seconds (ref 11.4–15.2)

## 2019-09-27 NOTE — Progress Notes (Signed)
PCP - Dr. Willey Blade Cardiologist - denies  Chest x-ray - need DOS EKG - today Stress Test - 09/25/19 ECHO - denies Cardiac Cath - denies  Sleep Study - denies CPAP - no  COVID TEST- to be done today.  Reviewed Visitation policy with pt and she voiced understanding.   Pt states she will randomly have nose bleeds (only on the right side of her nose). States she has never been checked for them and she doesn't seem to know what could be causing them.  She also states she has had a "sore" on the roof of her mouth. She states it's much better than it has been. Only a small reddened area noted just behind upper front teeth. Pt has very poor dentition.  Anesthesia review: Yes - see above.  Patient denies shortness of breath, fever, cough and chest pain at PAT appointment   All instructions explained to the patient, with a verbal understanding of the material. Patient agrees to go over the instructions while at home for a better understanding. Patient also instructed to self quarantine after being tested for COVID-19. The opportunity to ask questions was provided.

## 2019-09-27 NOTE — Pre-Procedure Instructions (Signed)
Sarah Lee  09/27/2019    Your procedure is scheduled on Monday, October 01, 2019 at 11:00 AM.   Report to [facility] Entrance "A" Admitting Office at 9:00 AM.   Call this number if you have problems the morning of surgery: [phone]   Questions prior to day of surgery, please call [phone] between 8 & 4 PM.   Remember:  Do not eat or drink after midnight "Sunday, 1//17/21.      Take these medicines the morning of surgery with A SIP OF WATER: NONE  Stop all Herbal medications and Vitamins as of today.  Do not use Aspirin products (BC Powders, Goody's, etc), NSAIDS (Ibuprofen, Aleve, etc) or Fish oil prior to surgery.    Do not wear jewelry, make-up or nail polish.  Do not wear lotions, powders, perfumes or deodorant.  Do not shave 48 hours prior to surgery.    Do not bring valuables to the hospital.  [facility] is not responsible for any belongings or valuables.  Contacts, dentures or bridgework may not be worn into surgery.  Leave your suitcase in the car.  After surgery it may be brought to your room.  For patients admitted to the hospital, discharge time will be determined by your treatment team.   - Preparing for Surgery  Before surgery, you can play an important role.  Because skin is not sterile, your skin needs to be as free of germs as possible.  You can reduce the number of germs on you skin by washing with CHG (chlorahexidine gluconate) soap before surgery.  CHG is an antiseptic cleaner which kills germs and bonds with the skin to continue killing germs even after washing.  Oral Hygiene is also important in reducing the risk of infection.  Remember to brush your teeth with your regular toothpaste the morning of surgery.  Please DO NOT use if you have an allergy to CHG or antibacterial soaps.  If your skin becomes reddened/irritated stop using the CHG and inform your nurse when you arrive at Short Stay.  Do not shave (including legs and  underarms) for at least 48 hours prior to the first CHG shower.  You may shave your face.  Please follow these instructions carefully:   1.  Shower with CHG Soap the night before surgery and the morning of Surgery.  2.  If you choose to wash your hair, wash your hair first as usual with your normal shampoo.  3.  After you shampoo, rinse your hair and body thoroughly to remove the shampoo. 4.  Use CHG as you would any other liquid soap.  You can apply chg directly to the skin and wash gently with a      scrungie or washcloth.           5.  Apply the CHG Soap to your body ONLY FROM THE NECK DOWN.   Do not use on open wounds or open sores. Avoid contact with your eyes, ears, mouth and genitals (private parts).  Wash genitals (private parts) with your normal soap.  6.  Wash thoroughly, paying special attention to the area where your surgery will be performed.  7.  Thoroughly rinse your body with warm water from the neck down.  8.  DO NOT shower/wash with your normal soap after using and rinsing off the CHG Soap.  9.  Pat yourself dry with a clean towel.            10" .  Wear clean pajamas.            11.  Place clean sheets on your bed the night of your first shower and do not sleep with pets.  Day of Surgery  Shower as above. Do not apply any lotions/deodorants the morning of surgery.   Please wear clean clothes to the hospital. Remember to brush your teeth with toothpaste.   Please read over the fact sheets that you were given.

## 2019-09-28 LAB — NOVEL CORONAVIRUS, NAA (HOSP ORDER, SEND-OUT TO REF LAB; TAT 18-24 HRS): SARS-CoV-2, NAA: NOT DETECTED

## 2019-09-28 NOTE — Anesthesia Preprocedure Evaluation (Addendum)
Anesthesia Evaluation  Patient identified by MRN, date of birth, ID band Patient awake    Reviewed: Allergy & Precautions, NPO status , Patient's Chart, lab work & pertinent test results  Airway Mallampati: I  TM Distance: >3 FB Neck ROM: Full    Dental   Pulmonary former smoker,    Pulmonary exam normal        Cardiovascular Normal cardiovascular exam     Neuro/Psych    GI/Hepatic GERD  Medicated and Controlled,  Endo/Other    Renal/GU      Musculoskeletal   Abdominal   Peds  Hematology   Anesthesia Other Findings   Reproductive/Obstetrics                            Anesthesia Physical Anesthesia Plan  ASA: III  Anesthesia Plan: General   Post-op Pain Management:    Induction:   PONV Risk Score and Plan: 3 and Ondansetron, Midazolam and Treatment may vary due to age or medical condition  Airway Management Planned: Double Lumen EBT  Additional Equipment: Arterial line  Intra-op Plan:   Post-operative Plan: Extubation in OR  Informed Consent: I have reviewed the patients History and Physical, chart, labs and discussed the procedure including the risks, benefits and alternatives for the proposed anesthesia with the patient or authorized representative who has indicated his/her understanding and acceptance.       Plan Discussed with: CRNA and Surgeon  Anesthesia Plan Comments: (Proep nuclear stress ordered by Dr. Kipp Brood was normal, low risk.  Preop labs reviewed, unremarkable.  EKG 09/27/18: Normal sinus rhythm. Rate 62. Minimal voltage criteria for LVH, may be normal variant  Nuclear stress 09/25/19:  Nuclear stress EF: 72%. The left ventricular ejection fraction is hyperdynamic (>65%).  There was no ST segment deviation noted during stress.  Defect 1: There is a small defect of moderate severity present in the apical inferior, apical lateral and apex location. This  is most c/w chest and diaphragmatic attenuation artifact.  This is a low risk study. There is no evidence of ischemia or previous infarction. There is hyperdynamic left ventricular systolic function.  The study is normal.)      Anesthesia Quick Evaluation

## 2019-10-01 ENCOUNTER — Inpatient Hospital Stay (HOSPITAL_COMMUNITY): Payer: BC Managed Care – PPO

## 2019-10-01 ENCOUNTER — Other Ambulatory Visit: Payer: Self-pay

## 2019-10-01 ENCOUNTER — Inpatient Hospital Stay (HOSPITAL_COMMUNITY): Payer: BC Managed Care – PPO | Admitting: Anesthesiology

## 2019-10-01 ENCOUNTER — Encounter (HOSPITAL_COMMUNITY)
Admission: RE | Disposition: A | Discharge status: Home / Self Care | Payer: Self-pay | Source: Home / Self Care | Attending: Thoracic Surgery (Cardiothoracic Vascular Surgery)

## 2019-10-01 ENCOUNTER — Inpatient Hospital Stay (HOSPITAL_COMMUNITY)
Admission: RE | Admit: 2019-10-01 | Discharge: 2019-10-04 | DRG: 164 | Disposition: A | Discharge status: Home / Self Care | Payer: BC Managed Care – PPO | Attending: Thoracic Surgery (Cardiothoracic Vascular Surgery) | Admitting: Thoracic Surgery (Cardiothoracic Vascular Surgery)

## 2019-10-01 ENCOUNTER — Encounter (HOSPITAL_COMMUNITY): Payer: Self-pay | Admitting: Thoracic Surgery (Cardiothoracic Vascular Surgery)

## 2019-10-01 DIAGNOSIS — R222 Localized swelling, mass and lump, trunk: Secondary | ICD-10-CM

## 2019-10-01 DIAGNOSIS — K219 Gastro-esophageal reflux disease without esophagitis: Secondary | ICD-10-CM | POA: Diagnosis present

## 2019-10-01 DIAGNOSIS — Z87891 Personal history of nicotine dependence: Secondary | ICD-10-CM

## 2019-10-01 DIAGNOSIS — Z09 Encounter for follow-up examination after completed treatment for conditions other than malignant neoplasm: Secondary | ICD-10-CM

## 2019-10-01 DIAGNOSIS — K5900 Constipation, unspecified: Secondary | ICD-10-CM | POA: Diagnosis not present

## 2019-10-01 DIAGNOSIS — Z800 Family history of malignant neoplasm of digestive organs: Secondary | ICD-10-CM | POA: Diagnosis not present

## 2019-10-01 DIAGNOSIS — C3432 Malignant neoplasm of lower lobe, left bronchus or lung: Principal | ICD-10-CM | POA: Diagnosis present

## 2019-10-01 DIAGNOSIS — R918 Other nonspecific abnormal finding of lung field: Secondary | ICD-10-CM

## 2019-10-01 DIAGNOSIS — Z9889 Other specified postprocedural states: Secondary | ICD-10-CM

## 2019-10-01 DIAGNOSIS — J95812 Postprocedural air leak: Secondary | ICD-10-CM | POA: Diagnosis present

## 2019-10-01 HISTORY — PX: VIDEO BRONCHOSCOPY: SHX5072

## 2019-10-01 HISTORY — PX: NODE DISSECTION: SHX5269

## 2019-10-01 SURGERY — BRONCHOSCOPY, VIDEO-ASSISTED
Anesthesia: General | Site: Chest

## 2019-10-01 MED ORDER — TRAMADOL HCL 50 MG PO TABS
50.0000 mg | ORAL_TABLET | Freq: Four times a day (QID) | ORAL | Status: DC | PRN
  Administered 2019-10-01 – 2019-10-02 (×2): 100 mg via ORAL
  Filled 2019-10-01 (×2): qty 2

## 2019-10-01 MED ORDER — PROPOFOL 10 MG/ML IV BOLUS
INTRAVENOUS | Status: DC | PRN
  Administered 2019-10-01: 130 mg via INTRAVENOUS
  Administered 2019-10-01: 50 mg via INTRAVENOUS
  Administered 2019-10-01: 30 mg via INTRAVENOUS

## 2019-10-01 MED ORDER — DEXAMETHASONE SODIUM PHOSPHATE 10 MG/ML IJ SOLN
INTRAMUSCULAR | Status: AC
  Filled 2019-10-01: qty 3

## 2019-10-01 MED ORDER — ACETAMINOPHEN 500 MG PO TABS
1000.0000 mg | ORAL_TABLET | Freq: Four times a day (QID) | ORAL | Status: DC
  Administered 2019-10-01 – 2019-10-03 (×6): 1000 mg via ORAL
  Filled 2019-10-01 (×9): qty 2

## 2019-10-01 MED ORDER — KETOROLAC TROMETHAMINE 30 MG/ML IJ SOLN
15.0000 mg | Freq: Four times a day (QID) | INTRAMUSCULAR | Status: DC
  Administered 2019-10-01 – 2019-10-04 (×11): 15 mg via INTRAVENOUS
  Filled 2019-10-01 (×11): qty 1

## 2019-10-01 MED ORDER — MIDAZOLAM HCL 2 MG/2ML IJ SOLN
INTRAMUSCULAR | Status: AC
  Filled 2019-10-01: qty 2

## 2019-10-01 MED ORDER — HYDROMORPHONE HCL 1 MG/ML IJ SOLN
0.2500 mg | INTRAMUSCULAR | Status: DC | PRN
  Administered 2019-10-01 (×3): 0.25 mg via INTRAVENOUS

## 2019-10-01 MED ORDER — KETOROLAC TROMETHAMINE 30 MG/ML IJ SOLN
INTRAMUSCULAR | Status: AC
  Filled 2019-10-01: qty 1

## 2019-10-01 MED ORDER — ROCURONIUM BROMIDE 10 MG/ML (PF) SYRINGE
PREFILLED_SYRINGE | INTRAVENOUS | Status: DC | PRN
  Administered 2019-10-01: 100 mg via INTRAVENOUS
  Administered 2019-10-01: 10 mg via INTRAVENOUS

## 2019-10-01 MED ORDER — 0.9 % SODIUM CHLORIDE (POUR BTL) OPTIME
TOPICAL | Status: DC | PRN
  Administered 2019-10-01: 1000 mL

## 2019-10-01 MED ORDER — ONDANSETRON HCL 4 MG/2ML IJ SOLN: 4.0000 mg | Freq: Once | INTRAMUSCULAR | Status: DC | PRN

## 2019-10-01 MED ORDER — KETOROLAC TROMETHAMINE 30 MG/ML IJ SOLN
30.0000 mg | Freq: Once | INTRAMUSCULAR | Status: AC
  Administered 2019-10-01: 30 mg via INTRAVENOUS

## 2019-10-01 MED ORDER — BUPIVACAINE HCL (PF) 0.5 % IJ SOLN
INTRAMUSCULAR | Status: AC
  Filled 2019-10-01: qty 30

## 2019-10-01 MED ORDER — ONDANSETRON HCL 4 MG/2ML IJ SOLN
INTRAMUSCULAR | Status: DC | PRN
  Administered 2019-10-01: 4 mg via INTRAVENOUS

## 2019-10-01 MED ORDER — SENNOSIDES-DOCUSATE SODIUM 8.6-50 MG PO TABS
1.0000 | ORAL_TABLET | Freq: Every day | ORAL | Status: DC
  Administered 2019-10-01 – 2019-10-02 (×2): 1 via ORAL
  Filled 2019-10-01 (×3): qty 1

## 2019-10-01 MED ORDER — FENTANYL CITRATE (PF) 250 MCG/5ML IJ SOLN
INTRAMUSCULAR | Status: AC
  Filled 2019-10-01: qty 5

## 2019-10-01 MED ORDER — ROCURONIUM BROMIDE 10 MG/ML (PF) SYRINGE
PREFILLED_SYRINGE | INTRAVENOUS | Status: AC
  Filled 2019-10-01: qty 40

## 2019-10-01 MED ORDER — LACTATED RINGERS IV SOLN: INTRAVENOUS | Status: DC | PRN

## 2019-10-01 MED ORDER — LACTATED RINGERS IV SOLN: INTRAVENOUS | Status: DC

## 2019-10-01 MED ORDER — MIDAZOLAM HCL 5 MG/5ML IJ SOLN
INTRAMUSCULAR | Status: DC | PRN
  Administered 2019-10-01: 2 mg via INTRAVENOUS

## 2019-10-01 MED ORDER — ACETAMINOPHEN 160 MG/5ML PO SOLN: 1000.0000 mg | Freq: Four times a day (QID) | ORAL | Status: DC

## 2019-10-01 MED ORDER — GLYCOPYRROLATE PF 0.2 MG/ML IJ SOSY
PREFILLED_SYRINGE | INTRAMUSCULAR | Status: DC | PRN
  Administered 2019-10-01 (×2): .1 mg via INTRAVENOUS

## 2019-10-01 MED ORDER — FENTANYL CITRATE (PF) 100 MCG/2ML IJ SOLN
INTRAMUSCULAR | Status: DC | PRN
  Administered 2019-10-01 (×2): 50 ug via INTRAVENOUS
  Administered 2019-10-01: 100 ug via INTRAVENOUS
  Administered 2019-10-01: 50 ug via INTRAVENOUS
  Administered 2019-10-01: 100 ug via INTRAVENOUS

## 2019-10-01 MED ORDER — PHENYLEPHRINE 40 MCG/ML (10ML) SYRINGE FOR IV PUSH (FOR BLOOD PRESSURE SUPPORT)
PREFILLED_SYRINGE | INTRAVENOUS | Status: DC | PRN
  Administered 2019-10-01: 120 ug via INTRAVENOUS

## 2019-10-01 MED ORDER — ONDANSETRON HCL 4 MG/2ML IJ SOLN
INTRAMUSCULAR | Status: AC
  Filled 2019-10-01: qty 4

## 2019-10-01 MED ORDER — VANCOMYCIN HCL IN DEXTROSE 1-5 GM/200ML-% IV SOLN
1000.0000 mg | INTRAVENOUS | Status: AC
  Administered 2019-10-01: 1000 mg via INTRAVENOUS

## 2019-10-01 MED ORDER — VANCOMYCIN HCL IN DEXTROSE 1-5 GM/200ML-% IV SOLN
1000.0000 mg | Freq: Two times a day (BID) | INTRAVENOUS | Status: AC
  Administered 2019-10-01: 1000 mg via INTRAVENOUS
  Filled 2019-10-01: qty 200

## 2019-10-01 MED ORDER — SODIUM CHLORIDE 0.9 % IV SOLN: INTRAVENOUS | Status: DC

## 2019-10-01 MED ORDER — ONDANSETRON HCL 4 MG/2ML IJ SOLN
4.0000 mg | Freq: Four times a day (QID) | INTRAMUSCULAR | Status: DC | PRN
  Administered 2019-10-03: 4 mg via INTRAVENOUS
  Filled 2019-10-01: qty 2

## 2019-10-01 MED ORDER — SODIUM CHLORIDE 0.9 % IV SOLN: INTRAVENOUS | Status: DC | PRN

## 2019-10-01 MED ORDER — IPRATROPIUM-ALBUTEROL 0.5-2.5 (3) MG/3ML IN SOLN
3.0000 mL | Freq: Four times a day (QID) | RESPIRATORY_TRACT | Status: DC
  Administered 2019-10-01: 3 mL via RESPIRATORY_TRACT
  Filled 2019-10-01: qty 3

## 2019-10-01 MED ORDER — FENTANYL CITRATE (PF) 100 MCG/2ML IJ SOLN
INTRAMUSCULAR | Status: AC
  Filled 2019-10-01: qty 2

## 2019-10-01 MED ORDER — DEXAMETHASONE SODIUM PHOSPHATE 10 MG/ML IJ SOLN
INTRAMUSCULAR | Status: DC | PRN
  Administered 2019-10-01: 10 mg via INTRAVENOUS

## 2019-10-01 MED ORDER — ENOXAPARIN SODIUM 40 MG/0.4ML ~~LOC~~ SOLN
40.0000 mg | Freq: Every day | SUBCUTANEOUS | Status: DC
  Administered 2019-10-02 – 2019-10-03 (×2): 40 mg via SUBCUTANEOUS
  Filled 2019-10-01 (×2): qty 0.4

## 2019-10-01 MED ORDER — MORPHINE SULFATE (PF) 2 MG/ML IV SOLN
2.0000 mg | INTRAVENOUS | Status: DC | PRN
  Administered 2019-10-01 – 2019-10-02 (×2): 2 mg via INTRAVENOUS
  Filled 2019-10-01 (×2): qty 1

## 2019-10-01 MED ORDER — SUGAMMADEX SODIUM 200 MG/2ML IV SOLN
INTRAVENOUS | Status: DC | PRN
  Administered 2019-10-01: 200 mg via INTRAVENOUS

## 2019-10-01 MED ORDER — MEPERIDINE HCL 25 MG/ML IJ SOLN: 6.2500 mg | INTRAMUSCULAR | Status: DC | PRN

## 2019-10-01 MED ORDER — BISACODYL 5 MG PO TBEC
10.0000 mg | DELAYED_RELEASE_TABLET | Freq: Every day | ORAL | Status: DC
  Administered 2019-10-02: 10 mg via ORAL
  Filled 2019-10-01: qty 2

## 2019-10-01 MED ORDER — SODIUM CHLORIDE FLUSH 0.9 % IV SOLN
INTRAVENOUS | Status: DC | PRN
  Administered 2019-10-01: 100 mL

## 2019-10-01 MED ORDER — SUGAMMADEX SODIUM 500 MG/5ML IV SOLN
INTRAVENOUS | Status: AC
  Filled 2019-10-01: qty 5

## 2019-10-01 MED ORDER — HYDROMORPHONE HCL 1 MG/ML IJ SOLN
INTRAMUSCULAR | Status: AC
  Filled 2019-10-01: qty 1

## 2019-10-01 MED ORDER — PROPOFOL 10 MG/ML IV BOLUS
INTRAVENOUS | Status: AC
  Filled 2019-10-01: qty 20

## 2019-10-01 MED ORDER — HEMOSTATIC AGENTS (NO CHARGE) OPTIME
TOPICAL | Status: DC | PRN
  Administered 2019-10-01: 3 via TOPICAL

## 2019-10-01 SURGICAL SUPPLY — 128 items
ADH SKN CLS APL DERMABOND .7 (GAUZE/BANDAGES/DRESSINGS) ×2
APL PRP STRL LF DISP 70% ISPRP (MISCELLANEOUS) ×2
APPLIER CLIP ROT 10 11.4 M/L (STAPLE)
APR CLP MED LRG 11.4X10 (STAPLE)
BAG SPEC RTRVL LRG 6X4 10 (ENDOMECHANICALS)
BLADE CLIPPER SURG (BLADE) ×3 IMPLANT
CANISTER SUCT 3000ML PPV (MISCELLANEOUS) ×3 IMPLANT
CANNULA REDUC XI 12-8 STAPL (CANNULA) ×2
CANNULA REDUCER 12-8 DVNC XI (CANNULA) ×4 IMPLANT
CATH THORACIC 28FR (CATHETERS) IMPLANT
CATH THORACIC 28FR RT ANG (CATHETERS) IMPLANT
CATH THORACIC 36FR (CATHETERS) IMPLANT
CATH THORACIC 36FR RT ANG (CATHETERS) IMPLANT
CATH TROCAR 20FR (CATHETERS) IMPLANT
CHLORAPREP W/TINT 26 (MISCELLANEOUS) ×3 IMPLANT
CLIP APPLIE ROT 10 11.4 M/L (STAPLE) IMPLANT
CLIP VESOCCLUDE MED 6/CT (CLIP) ×3 IMPLANT
CONN ST 1/4X3/8  BEN (MISCELLANEOUS)
CONN ST 1/4X3/8 BEN (MISCELLANEOUS) IMPLANT
CONN Y 3/8X3/8X3/8  BEN (MISCELLANEOUS)
CONN Y 3/8X3/8X3/8 BEN (MISCELLANEOUS) IMPLANT
CONT SPEC 4OZ CLIKSEAL STRL BL (MISCELLANEOUS) ×13 IMPLANT
COVER SURGICAL LIGHT HANDLE (MISCELLANEOUS) IMPLANT
DEFOGGER SCOPE WARMER CLEARIFY (MISCELLANEOUS) ×3 IMPLANT
DERMABOND ADVANCED (GAUZE/BANDAGES/DRESSINGS) ×1
DERMABOND ADVANCED .7 DNX12 (GAUZE/BANDAGES/DRESSINGS) IMPLANT
DISSECTOR BLUNT TIP ENDO 5MM (MISCELLANEOUS) IMPLANT
DRAIN CHANNEL 28F RND 3/8 FF (WOUND CARE) ×1 IMPLANT
DRAIN CHANNEL 32F RND 10.7 FF (WOUND CARE) IMPLANT
DRAPE ARM DVNC X/XI (DISPOSABLE) ×8 IMPLANT
DRAPE COLUMN DVNC XI (DISPOSABLE) ×2 IMPLANT
DRAPE DA VINCI XI ARM (DISPOSABLE) ×4
DRAPE DA VINCI XI COLUMN (DISPOSABLE) ×1
DRAPE WARM FLUID 44X44 (DRAPES) ×3 IMPLANT
ELECT BLADE 6.5 EXT (BLADE) ×3 IMPLANT
ELECT REM PT RETURN 9FT ADLT (ELECTROSURGICAL) ×3
ELECTRODE REM PT RTRN 9FT ADLT (ELECTROSURGICAL) ×2 IMPLANT
GAUZE SPONGE 4X4 12PLY STRL (GAUZE/BANDAGES/DRESSINGS) ×3 IMPLANT
GLOVE BIO SURGEON STRL SZ 6.5 (GLOVE) ×3 IMPLANT
GLOVE BIO SURGEON STRL SZ7 (GLOVE) ×6 IMPLANT
GOWN STRL REUS W/ TWL LRG LVL3 (GOWN DISPOSABLE) ×4 IMPLANT
GOWN STRL REUS W/ TWL XL LVL3 (GOWN DISPOSABLE) ×2 IMPLANT
GOWN STRL REUS W/TWL LRG LVL3 (GOWN DISPOSABLE) ×9
GOWN STRL REUS W/TWL XL LVL3 (GOWN DISPOSABLE) ×3
HEMOSTAT SURGICEL 2X14 (HEMOSTASIS) ×3 IMPLANT
IRRIGATOR SUCT 8 DISP DVNC XI (IRRIGATION / IRRIGATOR) ×2 IMPLANT
IRRIGATOR SUCTION 8MM XI DISP (IRRIGATION / IRRIGATOR) ×1
KIT BASIN OR (CUSTOM PROCEDURE TRAY) ×3 IMPLANT
KIT SUCTION CATH 14FR (SUCTIONS) IMPLANT
KIT TURNOVER KIT B (KITS) ×3 IMPLANT
LOOP VESSEL SUPERMAXI WHITE (MISCELLANEOUS) IMPLANT
NEEDLE 22X1 1/2 (OR ONLY) (NEEDLE) ×3 IMPLANT
NS IRRIG 1000ML POUR BTL (IV SOLUTION) ×9 IMPLANT
OBTURATOR OPTICAL STANDARD 8MM (TROCAR) ×1
OBTURATOR OPTICAL STND 8 DVNC (TROCAR) ×2
OBTURATOR OPTICALSTD 8 DVNC (TROCAR) ×2 IMPLANT
PACK CHEST (CUSTOM PROCEDURE TRAY) ×3 IMPLANT
PACK UNIVERSAL I (CUSTOM PROCEDURE TRAY) ×3 IMPLANT
PAD ARMBOARD 7.5X6 YLW CONV (MISCELLANEOUS) ×6 IMPLANT
POUCH ENDO CATCH II 15MM (MISCELLANEOUS) IMPLANT
POUCH SPECIMEN RETRIEVAL 10MM (ENDOMECHANICALS) IMPLANT
RELOAD STAPLE 45 2.5 WHT DVNC (STAPLE) IMPLANT
RELOAD STAPLE 45 3.5 BLU DVNC (STAPLE) IMPLANT
RELOAD STAPLE 45 4.3 GRN DVNC (STAPLE) IMPLANT
RELOAD STAPLER 2.5X45 WHT DVNC (STAPLE) ×8 IMPLANT
RELOAD STAPLER 3.5X45 BLU DVNC (STAPLE) ×2 IMPLANT
RELOAD STAPLER 4.3X45 GRN DVNC (STAPLE) ×4 IMPLANT
RETRACTOR WOUND ALXS 19CM XSML (INSTRUMENTS) IMPLANT
RTRCTR WOUND ALEXIS 19CM XSML (INSTRUMENTS)
SCISSORS LAP 5X35 DISP (ENDOMECHANICALS) IMPLANT
SEAL CANN UNIV 5-8 DVNC XI (MISCELLANEOUS) ×4 IMPLANT
SEAL XI 5MM-8MM UNIVERSAL (MISCELLANEOUS) ×2
SEALANT PROGEL (MISCELLANEOUS) IMPLANT
SEALANT SURG COSEAL 4ML (VASCULAR PRODUCTS) IMPLANT
SEALANT SURG COSEAL 8ML (VASCULAR PRODUCTS) IMPLANT
SEALER LIGASURE MARYLAND 30 (ELECTROSURGICAL) ×3 IMPLANT
SET IRRIG TUBING LAPAROSCOPIC (IRRIGATION / IRRIGATOR) IMPLANT
SET TUBE SMOKE EVAC HIGH FLOW (TUBING) IMPLANT
SOL ANTI FOG 6CC (MISCELLANEOUS) ×2 IMPLANT
SOLUTION ANTI FOG 6CC (MISCELLANEOUS) ×1
SPECIMEN JAR MEDIUM (MISCELLANEOUS) IMPLANT
SPONGE INTESTINAL PEANUT (DISPOSABLE) ×6 IMPLANT
SPONGE TONSIL TAPE 1 RFD (DISPOSABLE) ×3 IMPLANT
STAPLER 45 DA VINCI SURE FORM (STAPLE) ×1
STAPLER 45 SUREFORM DVNC (STAPLE) IMPLANT
STAPLER CANNULA SEAL DVNC XI (STAPLE) ×4 IMPLANT
STAPLER CANNULA SEAL XI (STAPLE) ×2
STAPLER ENDO GIA 12 SHRT THIN (STAPLE) IMPLANT
STAPLER ENDO GIA 12MM SHORT (STAPLE) IMPLANT
STAPLER RELOAD 2.5X45 WHITE (STAPLE) ×4
STAPLER RELOAD 2.5X45 WHT DVNC (STAPLE) ×8
STAPLER RELOAD 3.5X45 BLU DVNC (STAPLE) ×2
STAPLER RELOAD 3.5X45 BLUE (STAPLE) ×1
STAPLER RELOAD 4.3X45 GREEN (STAPLE) ×2
STAPLER RELOAD 4.3X45 GRN DVNC (STAPLE) ×4
STOPCOCK 4 WAY LG BORE MALE ST (IV SETS) ×3 IMPLANT
SUT MNCRL AB 3-0 PS2 18 (SUTURE) IMPLANT
SUT MON AB 2-0 CT1 36 (SUTURE) IMPLANT
SUT PDS AB 1 CTX 36 (SUTURE) IMPLANT
SUT PROLENE 4 0 RB 1 (SUTURE)
SUT PROLENE 4-0 RB1 .5 CRCL 36 (SUTURE) IMPLANT
SUT SILK  1 MH (SUTURE)
SUT SILK 1 MH (SUTURE) IMPLANT
SUT SILK 1 TIES 10X30 (SUTURE) ×3 IMPLANT
SUT SILK 2 0 SH (SUTURE) IMPLANT
SUT SILK 2 0SH CR/8 30 (SUTURE) IMPLANT
SUT VIC AB 1 CTX 36 (SUTURE)
SUT VIC AB 1 CTX36XBRD ANBCTR (SUTURE) IMPLANT
SUT VIC AB 2-0 CT1 27 (SUTURE) ×6
SUT VIC AB 2-0 CT1 TAPERPNT 27 (SUTURE) ×4 IMPLANT
SUT VIC AB 3-0 SH 27 (SUTURE) ×9
SUT VIC AB 3-0 SH 27X BRD (SUTURE) ×6 IMPLANT
SUT VICRYL 0 TIES 12 18 (SUTURE) ×1 IMPLANT
SUT VICRYL 0 UR6 27IN ABS (SUTURE) ×3 IMPLANT
SUT VICRYL 2 TP 1 (SUTURE) IMPLANT
SYR 10ML LL (SYRINGE) ×3 IMPLANT
SYR 30ML LL (SYRINGE) ×3 IMPLANT
SYR 50ML LL SCALE MARK (SYRINGE) ×3 IMPLANT
SYSTEM SAHARA CHEST DRAIN ATS (WOUND CARE) ×3 IMPLANT
TAPE CLOTH 4X10 WHT NS (GAUZE/BANDAGES/DRESSINGS) ×3 IMPLANT
TAPE CLOTH SURG 6X10 WHT LF (GAUZE/BANDAGES/DRESSINGS) ×1 IMPLANT
TIP APPLICATOR SPRAY EXTEND 16 (VASCULAR PRODUCTS) IMPLANT
TOWEL GREEN STERILE (TOWEL DISPOSABLE) ×3 IMPLANT
TOWEL GREEN STERILE FF (TOWEL DISPOSABLE) ×3 IMPLANT
TRAY FOLEY MTR SLVR 16FR STAT (SET/KITS/TRAYS/PACK) ×3 IMPLANT
TROCAR XCEL BLADELESS 5X75MML (TROCAR) IMPLANT
TUBING EXTENTION W/L.L. (IV SETS) ×3 IMPLANT
WATER STERILE IRR 1000ML POUR (IV SOLUTION) ×3 IMPLANT

## 2019-10-01 NOTE — Progress Notes (Signed)
Pt c/o of severe  left neck pain that went to left mid chest. Morphine given along with ultram. Left chest tube intact to water seal and has been since PACU. Moderate drainage on dressing. She is positive for moderate air leak. I notified Dr. Cyndia Bent he agree to  place chest tube on 20 cm of suction which I did. Patient is now resting more comfortable at this time and I will continue to monitor.

## 2019-10-01 NOTE — Anesthesia Postprocedure Evaluation (Signed)
Anesthesia Post Note  Patient: Sarah Lee  Procedure(s) Performed: VIDEO BRONCHOSCOPY (N/A ) XI ROBOTIC ASSISTED THORASCOPY-LEFT LOWER LOBECTOMY (Left Chest) Node Dissection (Left Chest)     Patient location during evaluation: PACU Anesthesia Type: General Level of consciousness: awake and alert Pain management: pain level controlled Vital Signs Assessment: post-procedure vital signs reviewed and stable Respiratory status: spontaneous breathing, nonlabored ventilation, respiratory function stable and patient connected to nasal cannula oxygen Cardiovascular status: blood pressure returned to baseline and stable Postop Assessment: no apparent nausea or vomiting Anesthetic complications: no    Last Vitals:  Vitals:   10/01/19 1615 10/01/19 1639  BP: 113/66 114/63  Pulse: 78   Resp: 13   Temp: 36.7 C 36.4 C  SpO2: 99%     Last Pain:  Vitals:   10/01/19 1639  TempSrc: Axillary  PainSc:                  Jakoby Melendrez DAVID

## 2019-10-01 NOTE — Brief Op Note (Signed)
10/01/2019  2:48 PM  PATIENT:  Sarah Lee  69 y.o. female  PRE-OPERATIVE DIAGNOSIS:  Lung mass  POST-OPERATIVE DIAGNOSIS:  Lung mass  PROCEDURES:  --VIDEO BRONCHOSCOPY  --XI ROBOTIC ASSISTED THORASCOPY-LEFT LEFT LOWER LOBECTOMY  --MEDIASTINAL LYMPH NODE DISSECTION --INTERCOSTAL NERVE BLOCK  SURGEON:  Surgeon(s) and Role:    * Lightfoot, Lucile Crater, MD - Primary    * Melrose Nakayama, MD - Assisting  PHYSICIAN ASSISTANT: Briyah Wheelwright  ANESTHESIA:   general  EBL:  50 mL   BLOOD ADMINISTERED:none  DRAINS: Left 68fr pleural tube   LOCAL MEDICATIONS USED:  Exparel   SPECIMEN:  Source of Specimen:  Left lower lung lobe, multiple lymph nodes.  DISPOSITION OF SPECIMEN:  PATHOLOGY  COUNTS:  YES  DICTATION: .Dragon Dictation  PLAN OF CARE: Admit to inpatient   PATIENT DISPOSITION:  PACU - hemodynamically stable.   Delay start of Pharmacological VTE agent (>24hrs) due to surgical blood loss or risk of bleeding: yes

## 2019-10-01 NOTE — Op Note (Signed)
      [address]       [place] [address]             [phone]        10/01/2019  Patient:  [name] Pre-Op Dx: Left lower lobe adenocarcinoma   Post-op Dx:  same Procedure: - Bronchoscopy - Robotic assisted left video thoracoscopy - left lower lobectomy - Mediastinal lymph node sampling - Intercostal nerve block  Surgeon and Role:      * Gilad Dugger, Lucile Crater, MD - Primary    * Dr. Roxan Hockey, MD - assisting Assistant: Macarthur Critchley, PA-C  Anesthesia  general EBL:  100 ml Blood Administration: none Specimen:  Station 5, 7, 9, 11, 12.  Left lower lobe   Drains: 94 F argyle chest tube in left chest Counts: correct   Indications: 69 yo female with a left lower lobe mass that was PET avid.  Biopsy showed adenocarcinoma.  She was evaluated, and a good surgical candidate.  Findings: Left lower lobe mass was visible, with a biopsy tag.  Normal anatomy.    Operative Technique: After the risks, benefits and alternatives were thoroughly discussed, the patient was brought to the operative theatre.  Anesthesia was induced, and the bronchoscope was passed through the endotracheal tube.  All segmental bronchi were visualized.  The endotracheal tube was then exchanged for a double lumen tube.  The patient was then placed in a right decubitus position and was prepped and draped in normal sterile fashion.  An appropriate surgical pause was performed, and pre-operative antibiotics were dosed accordingly.  We began by placing our 4 robotic ports in the the 7th intercostal space targeting the hilum of the lung.  A 82mm assistant port was placed in the 9th intercostal space in the anterior axillary line.  The robot was then docked and all instruments were passed under direct visualization.    The lung was then retracted superiorly, and the inferior pulmonary ligament was divided.  The hilum was mobilized anteriorly and posteriorly.  We identified the inferior  pulmonary vein, and after careful isolation, it was divided with a vascular stapler.  We next moved to the  fissure.  The left lower lobe pulmonary artery was then isolated and divided with a vascular load stapler.  The bronchus to the left lower lobe was then isolated.  After a test clamp, with good ventilation of the upper lobe, the bronchus was then divided.  The fissure was completed, and the specimen was passed into an endocatch bag.  It was removed from the superior access site.    Lymph nodes were then sampled at levels 7, 9,11,12, and 5.  The chest was irrigated, and an air leak test was performed.  An intercostal nerve block was performed under direct visualization.  A 51F chest with then placed, and we watch the remaining lobes re-expand.  The skin and soft tissue were closed with absorbable suture    The patient tolerated the procedure without any immediate complications, and was transferred to the PACU in stable condition.  Trayshawn Durkin Bary Leriche

## 2019-10-01 NOTE — H&P (Signed)
[address]       [place] [address]             [phone]        [facility] A Ventrella  [facility] Medical Record #[id number]  Date of Birth: 20-Apr-1951  Referring: Tanda Rockers, MD  Primary Care: Willey Blade, MD  Primary Cardiologist: No primary care provider on file.     No changes since her last clinic appointment.  Stress test was negative  OR today for bronchoscopy, Left robotic assisted VATS, left lower lobectomy.   Pre my last note  Chief Complaint:      Chief Complaint  Patient presents with  . Follow-up    LLL mass   History of Present Illness:  Sarah Lee 69 y.o. female come to discuss biopsy results. He pathology did show adenocarcinoma. She has no complaints today.  Per my last note:  Sarah Lee 69 y.o. female referred by Dr. Melvyn Novas for surgical evaluation of a LLL pulmonary mass that was PET avid. On review of her images from 2007, there was a report that described an incompletely visualized 2cm nodule in the LLL. She subsequently had a clear CXR from 2014.  She admits to an occasional dry cough, but denies any weight changes. She regularly has had headaches for much of her life. 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 She has no 2nd hand smoke exposure.  She is unable to walk up 2 flights of steps due to dyspnea.  Zubrod Score:  At the time of surgery this patient's most appropriate activity status/level should be described as:  ? 0 Normal activity, no symptoms  ? 1 Restricted in physical strenuous activity but ambulatory, able to do out light work  ? 2 Ambulatory and capable of self care, unable to do work activities, up and about >50 % of waking hours  ? 3 Only limited self care, in bed greater than 50% of waking hours  ? 4 Completely disabled, no self care, confined to bed or chair  ? 5 Moribund      Past Medical History:    Diagnosis Date  . Allergy   . Anemia   . Arthritis   . Asthma   . Blood transfusion   . Cataract    slight  . Complication of anesthesia    used sodium pentothal and very hard to wake up and arm swelled up from medication  . Constipation   . Gallstones   . GERD (gastroesophageal reflux disease)   . Heart murmur   . Seasonal allergies         Past Surgical History:  Procedure Laterality Date  . ABDOMINAL HYSTERECTOMY  2004   fibroids--- TAH/BSO  . BREAST SURGERY     right breast biopsy-benign  . CESAREAN SECTION    . CHOLECYSTECTOMY N/A 10/28/2016   Procedure: LAPAROSCOPIC CHOLECYSTECTOMY WITH INTRAOPERATIVE CHOLANGIOGRAM; Surgeon: Jackolyn Confer, MD; Location: [facility]; Service: General; Laterality: N/A;  . COLONOSCOPY    . ERCP N/A 10/29/2016   Procedure: ENDOSCOPIC RETROGRADE CHOLANGIOPANCREATOGRAPHY (ERCP); Surgeon: Doran Stabler, MD; Location: [facility]; Service: Endoscopy; Laterality: N/A;  . POLYPECTOMY    . UTERINE FIBROID SURGERY          Family History  Problem Relation Age of Onset  . Cancer Father    prostate  . Anemia  Mother   . Stomach cancer Cousin    maternal side   . Stomach cancer Maternal Uncle   . Colon cancer Neg Hx   . Esophageal cancer Neg Hx   . Rectal cancer Neg Hx   . Colon polyps Neg Hx    Social History       Tobacco Use  Smoking Status Former Smoker  . Packs/day: 1.50  . Years: 25.00  . Pack years: 37.50  . Types: Cigarettes  . Quit date: 03/02/1988  . Years since quitting: 31.5  Smokeless Tobacco Never Used   Social History       Substance and Sexual Activity  Alcohol Use Yes   Comment: wine occassionally        Allergies  Allergen Reactions  . Advil [Ibuprofen] Other (See Comments)    Rectal bleeding   . Iron Other (See Comments)    Extreme constipation  . Penicillins Rash  . Tetracyclines & Related Nausea And Vomiting and Anxiety         Current Outpatient Medications  Medication Sig Dispense Refill  .  Acetylcysteine (N-ACETYL-L-CYSTEINE PO) Take by mouth.    . cholecalciferol (VITAMIN D3) 25 MCG (1000 UT) tablet Take 10,000 Units by mouth daily.    . Magnesium 200 MG TABS Take by mouth.    Marland Kitchen Specialty Vitamins Products (JOINT/BONE VITALITY PO) Take 1 tablet by mouth daily.    . TURMERIC PO Take 1 each by mouth daily. Tea bag liquid    . zinc gluconate 50 MG tablet Take 30 mg by mouth daily.     No current facility-administered medications for this visit.   PHYSICAL EXAMINATION:  BP (!) 144/84 (BP Location: Right Arm)  Pulse 82  Temp (!) 97.3 F (36.3 C) (Skin)  Resp 20  Ht 5' 4.5" (1.638 m)  Wt 212 lb 8 oz (96.4 kg)  LMP 01/17/1994  SpO2 95%  BMI 35.91 kg/m  Physical Exam  Constitutional: She is oriented to person, place, and time. She appears well-developed and well-nourished. No distress.  HENT:  Head: Normocephalic.  Eyes: No scleral icterus.  Neck: No tracheal deviation present.  Cardiovascular: Normal rate.  Respiratory: Effort normal. No respiratory distress.  GI: Soft. She exhibits no distension.  Musculoskeletal:  General: Normal range of motion.  Cervical back: Normal range of motion.  Neurological: She is alert and oriented to person, place, and time.  Skin: Skin is warm and dry. She is not diaphoretic.   Diagnostic Studies & Laboratory data:  Recent Radiology Findings:  Imaging Results    I have independently reviewed the above radiology studies and reviewed the findings with the patient.  Recent Lab Findings:  Recent Labs                                                                                                                                  PFTs:  - FVC: 81%  - FEV1: 81%  -  DLCO: 93%  Assessment / Plan:  69 yo female with a biopsy proven adenocarcinoma of the left lower lobe. PET CT did not show any lymph node avidity. 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 She will undergo an MRI brain, and stress test. She is  tentatively scheduled for 10/01/19 for a bronchoscopy, and robotic assisted left VATS, left lower lobectomy.

## 2019-10-01 NOTE — Anesthesia Procedure Notes (Signed)
Procedure Name: Intubation Date/Time: 10/01/2019 11:37 AM Performed by: Imagene Riches, CRNA Pre-anesthesia Checklist: Patient identified, Emergency Drugs available, Suction available and Patient being monitored Patient Re-evaluated:Patient Re-evaluated prior to induction Oxygen Delivery Method: Circle System Utilized Preoxygenation: Pre-oxygenation with 100% oxygen Induction Type: IV induction Ventilation: Mask ventilation without difficulty Laryngoscope Size: Mac and 3 Grade View: Grade I Tube type: Oral Endobronchial tube: Left, Double lumen EBT, EBT position confirmed by auscultation and EBT position confirmed by fiberoptic bronchoscope and 37 Fr Number of attempts: 1 Airway Equipment and Method: Stylet and Oral airway Placement Confirmation: ETT inserted through vocal cords under direct vision,  positive ETCO2 and breath sounds checked- equal and bilateral Secured at: 27 cm Tube secured with: Tape Dental Injury: Teeth and Oropharynx as per pre-operative assessment

## 2019-10-01 NOTE — Anesthesia Procedure Notes (Signed)
Arterial Line Insertion Start/End1/18/2021 10:55 AM, 10/01/2019 11:00 AM Performed by: Wilburn Cornelia, CRNA, CRNA  Patient location: Pre-op. Preanesthetic checklist: patient identified, IV checked, site marked, risks and benefits discussed, surgical consent, monitors and equipment checked, pre-op evaluation, timeout performed and anesthesia consent Lidocaine 1% used for infiltration Right, radial was placed Catheter size: 20 G Hand hygiene performed  and maximum sterile barriers used  Allen's test indicative of satisfactory collateral circulation Attempts: 1 Procedure performed without using ultrasound guided technique. Following insertion, Biopatch and dressing applied. Post procedure assessment: normal  Patient tolerated the procedure well with no immediate complications.

## 2019-10-01 NOTE — Transfer of Care (Signed)
Immediate Anesthesia Transfer of Care Note  Patient: CARESSE SEDIVY  Procedure(s) Performed: VIDEO BRONCHOSCOPY (N/A ) XI ROBOTIC ASSISTED THORASCOPY-LEFT LOWER LOBECTOMY (Left Chest) Node Dissection (Left Chest)  Patient Location: PACU  Anesthesia Type:General  Level of Consciousness: awake, alert  and oriented  Airway & Oxygen Therapy: Patient Spontanous Breathing and Patient connected to face mask oxygen  Post-op Assessment: Report given to RN and Post -op Vital signs reviewed and stable  Post vital signs: Reviewed and stable  Last Vitals:  Vitals Value Taken Time  BP 113/60 10/01/19 1516  Temp    Pulse 84 10/01/19 1519  Resp 20 10/01/19 1519  SpO2 100 % 10/01/19 1519  Vitals shown include unvalidated device data.  Last Pain:  Vitals:   10/01/19 1009  TempSrc:   PainSc: 0-No pain         Complications: No apparent anesthesia complications

## 2019-10-02 ENCOUNTER — Inpatient Hospital Stay (HOSPITAL_COMMUNITY): Payer: BC Managed Care – PPO

## 2019-10-02 LAB — BASIC METABOLIC PANEL
Anion gap: 9 (ref 5–15)
BUN: 9 mg/dL (ref 8–23)
CO2: 26 mmol/L (ref 22–32)
Calcium: 9 mg/dL (ref 8.9–10.3)
Chloride: 103 mmol/L (ref 98–111)
Creatinine, Ser: 0.83 mg/dL (ref 0.44–1.00)
GFR calc Af Amer: >60 mL/min (ref >60)
GFR calc non Af Amer: >60 mL/min (ref >60)
Glucose, Bld: 138 mg/dL — ABNORMAL HIGH (ref 70–99)
Potassium: 3.9 mmol/L (ref 3.5–5.1)
Sodium: 138 mmol/L (ref 135–145)

## 2019-10-02 LAB — CBC
HCT: 36.6 % (ref 36.0–46.0)
Hemoglobin: 12 g/dL (ref 12.0–15.0)
MCH: 29.7 pg (ref 26.0–34.0)
MCHC: 32.8 g/dL (ref 30.0–36.0)
MCV: 90.6 fL (ref 80.0–100.0)
Platelets: 161 10*3/uL (ref 150–400)
RBC: 4.04 MIL/uL (ref 3.87–5.11)
RDW: 12.4 % (ref 11.5–15.5)
WBC: 8.8 10*3/uL (ref 4.0–10.5)
nRBC: 0 % (ref 0.0–0.2)

## 2019-10-02 LAB — BLOOD GAS, ARTERIAL
Acid-Base Excess: 0.7 mmol/L (ref 0.0–2.0)
Bicarbonate: 25.2 mmol/L (ref 20.0–28.0)
FIO2: 21
O2 Saturation: 95.9 %
Patient temperature: 37
pCO2 arterial: 43.9 mmHg (ref 32.0–48.0)
pH, Arterial: 7.378 (ref 7.350–7.450)
pO2, Arterial: 77.9 mmHg — ABNORMAL LOW (ref 83.0–108.0)

## 2019-10-02 LAB — FUNGAL ORGANISM REFLEX

## 2019-10-02 LAB — FUNGUS CULTURE WITH STAIN

## 2019-10-02 LAB — FUNGUS CULTURE RESULT

## 2019-10-02 MED ORDER — IPRATROPIUM-ALBUTEROL 0.5-2.5 (3) MG/3ML IN SOLN: 3.0000 mL | Freq: Four times a day (QID) | RESPIRATORY_TRACT | Status: DC | PRN

## 2019-10-02 MED ORDER — IPRATROPIUM-ALBUTEROL 0.5-2.5 (3) MG/3ML IN SOLN
3.0000 mL | Freq: Three times a day (TID) | RESPIRATORY_TRACT | Status: DC
  Administered 2019-10-02: 3 mL via RESPIRATORY_TRACT
  Filled 2019-10-02: qty 3

## 2019-10-02 NOTE — Progress Notes (Addendum)
1 Day Post-Op Procedure(s) (LRB): VIDEO BRONCHOSCOPY (N/A) XI ROBOTIC ASSISTED THORASCOPY-LEFT LOWER LOBECTOMY (Left) Node Dissection (Left) Subjective: Awake and alert, says pain is controlled.  On RA with adequate O2 sats.   Objective: Vital signs in last 24 hours: Temp:  [97.3 F (36.3 C)-98.5 F (36.9 C)] 98.5 F (36.9 C) (01/19 0815) Pulse Rate:  [67-100] 67 (01/19 0815) Cardiac Rhythm: Normal sinus rhythm (01/19 0705) Resp:  [12-24] 24 (01/19 0815) BP: (101-139)/(55-83) 117/55 (01/19 0815) SpO2:  [97 %-100 %] 100 % (01/19 0823) Arterial Line BP: (129-147)/(61-70) 147/69 (01/18 1615) Weight:  [97.2 kg] 97.2 kg (01/18 0923)     Intake/Output from previous day: 01/18 0701 - 01/19 0700 In: 2080 [P.O.:280; I.V.:1400; IV Piggyback:400] Out: 1025 [Urine:645; Blood:50; Chest Tube:330] Intake/Output this shift: No intake/output data recorded.  General appearance: alert, cooperative and no distress Neurologic: intact Heart: regular rate and rhythm Lungs: Few anterior crackles on left, otherwise CTA. Small air leak with cough only.  Wound: CT is secure, chest dressings are dry.    PORTABLE CHEST 1 VIEW  COMPARISON:  Radiograph 09/30/2017  FINDINGS: Left chest tube remains in place. No visualized pneumothorax. Slightly lower lung volumes from prior exam. Minimal streaky left lung base atelectasis. Linear atelectasis in the right mid lung versus fissural thickening/fluid. No new airspace disease. No pleural fluid. Unchanged heart size and mediastinal contours.  IMPRESSION: 1. Left chest tube remains in place. No visualized pneumothorax. 2. Low lung volumes with streaky left lung base atelectasis.   Electronically Signed   By: Keith Rake M.D.   On: 10/02/2019 06:16   Lab Results: Recent Labs    10/02/19 0228  WBC 8.8  HGB 12.0  HCT 36.6  PLT 161   BMET:  Recent Labs    10/02/19 0228  NA 138  K 3.9  CL 103  CO2 26  GLUCOSE 138*  BUN 9   CREATININE 0.83  CALCIUM 9.0    PT/INR: No results for input(s): LABPROT, INR in the last 72 hours. ABG    Component Value Date/Time   PHART 7.378 10/02/2019 0422   HCO3 25.2 10/02/2019 0422   O2SAT 95.9 10/02/2019 0422   CBG (last 3)  No results for input(s): GLUCAP in the last 72 hours.  Assessment/Plan: S/P Procedure(s) (LRB): VIDEO BRONCHOSCOPY (N/A) XI ROBOTIC ASSISTED THORASCOPY-LEFT LOWER LOBECTOMY (Left) Node Dissection (Left)  -POD-1 Left robotic-assisted VATS, left lower lobectomy and lymph node sampling for biopsy-proven adenocarcinoma. Stable respiratory status, minimal air leak seen only with cough. CXR shows good expansion of the left lung. CT to water seal. Repeat CXR in AM.  Mobilize and encourage pulmonary hygiene. D/C IVF since oral intake is adequate.   -DVT PPX- to begin enoxaparin today.    LOS: 1 day    Antony Odea, PA-C [phone] 10/02/2019   Agree with above Doing well Small leak with cough Will keep CT to waterseal Aggressive pulm toilet today  [name]

## 2019-10-02 NOTE — Plan of Care (Signed)
  Problem: Education: Goal: Knowledge of General Education information will improve Description: Including pain rating scale, medication(s)/side effects and non-pharmacologic comfort measures Outcome: Progressing   Problem: Health Behavior/Discharge Planning: Goal: Ability to manage health-related needs will improve Outcome: Progressing   Problem: Clinical Measurements: Goal: Ability to maintain clinical measurements within normal limits will improve Outcome: Progressing   Problem: Education: Goal: Required Educational Video(s) Outcome: Progressing   Problem: Clinical Measurements: Goal: Postoperative complications will be avoided or minimized Outcome: Progressing   Problem: Skin Integrity: Goal: Demonstration of wound healing without infection will improve Outcome: Progressing

## 2019-10-02 NOTE — Progress Notes (Signed)
Patient continues to feel better this morning. Remains on 20 cm suction and no air leak present at this time. Pain controlled with meds.

## 2019-10-02 NOTE — Discharge Summary (Signed)
Physician Discharge Summary  Patient ID: Sarah Lee MRN: [id number] DOB/AGE: [birth year] 69 y.o.  Admit date: 10/01/2019 Discharge date: 10/05/2019  Admission Diagnoses:  Patient Active Problem List   Diagnosis Date Noted  . Lung cancer ([place]) 09/18/2019    Discharge Diagnoses:  Active Problems:   S/P thoracotomy   Discharged Condition: good  History of Present Illness: Sarah Lee [id number] 69 y.o.femalereferred by Dr. Melvyn Novas for surgical evaluation of a LLL pulmonary mass that was PET avid. On review of her images from 2007, there was a report that described an incompletely visualized 2cm nodule in the LLL. She subsequently had a clear CXR from 2014.  She admits to an occasional dry cough, but denies any weight changes. She regularly has had headaches for much of her life. 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 She has no 2nd hand smoke exposure. She is unable to walk up 2 flights of steps due to dyspnea. A CT-guided biopsy of the left lower lobe lung lesion was performed by interventional radiology on December of 2020 and confirmed adenocarcinoma.  Treatment options were discussed with the patient by Dr. Kipp Brood and she elected to proceed with lobectomy.   Hospital Course:  Sarah Lee was admitted for elective surgery on 10/01/19 and taken to the OR where left robotic assisted thoracoscopy for left lower lobectomy and lymph node sampling. The procedure was without complication afterwhich she was recovered in the PACU and later transferred to [facility].  Pain was managed with IV Toradol along with oral Tylenol and tramadol. On the first postop day, there was a small air leak seen only with cough and the CXR showed full expansion of the remaining left lung with no PTX. The chest tube was placed to water seal. The patient was mobilized and diet was advanced on the first postop  day.  She continued to make progress. The chest tube was removed on 1/21 and the follow up CXR showed  No pneumothorax. She was determined to be stable for discharge.   Consults: None  Significant Diagnostic Studies:  EXAM: CT-guided biopsy left lower lobe masslike consolidation  Interventional Radiologist:  Criselda Peaches, MD  MEDICATIONS: None.  ANESTHESIA/SEDATION: Fentanyl 75 mcg IV; Versed 1.5 mg IV  Moderate Sedation Time:  15 minutes  The patient was continuously monitored during the procedure by the interventional radiology nurse under my direct supervision.  FLUOROSCOPY TIME:  None  COMPLICATIONS: None immediate.  Estimated blood loss:  0  PROCEDURE: Informed written consent was obtained from the patient after a thorough discussion of the procedural risks, benefits and alternatives. All questions were addressed. Maximal Sterile Barrier Technique was utilized including caps, mask, sterile gowns, sterile gloves, sterile drape, hand hygiene and skin antiseptic. A timeout was performed prior to the initiation of the procedure.  A planning axial CT scan was performed. The region of masslike consolidation in the left lower lobe was successfully identified. A suitable skin entry site was selected and marked. The region was then sterilely prepped and draped in standard fashion using Betadine skin prep. Local anesthesia was attained by infiltration with 1% lidocaine. A small dermatotomy was made. Under intermittent CT fluoroscopic guidance, a 17 gauge trocar needle was advanced into the lung and positioned at the margin of the nodule.  Multiple 18 gauge core biopsies were then coaxially obtained using the BioPince automated biopsy device. Biopsy specimens were  placed in formalin and delivered to pathology for further analysis. A bio sentry device was deployed. Post biopsy axial CT imaging demonstrates no evidence of immediate complication. There is  no pneumothorax. Mild perilesional alveolar hemorrhage is not unexpected. The patient tolerated the procedure well.  IMPRESSION: Technically successful CT-guided biopsy left lower lobe region of masslike consolidation. Biopsy specimens will be sent for surgical pathology as well as fungal and AFB culture.  Signed,  Criselda Peaches, MD, [facility]  [facility]   Electronically Signed   By: Jacqulynn Cadet M.D.   On: 09/03/2019 09:36  Treatments:   OPERATIVE NOTE  10/01/2019  Patient:  Sarah Lee Pre-Op Dx: Left lower lobe adenocarcinoma   Post-op Dx:  same Procedure: - Bronchoscopy - Robotic assisted left video thoracoscopy - left lower lobectomy - Mediastinal lymph node sampling - Intercostal nerve block  Surgeon and Role:      * Lightfoot, Lucile Crater, MD - Primary    * Dr. Roxan Hockey, MD - assisting Assistant: Macarthur Critchley, PA-C  Anesthesia  general EBL:  100 ml Blood Administration: none Specimen:  Station 5, 7, 9, 11, 12.  Left lower lobe   Drains: 38 F argyle chest tube in left chest Counts: correct   Indications: 69 yo female with a left lower lobe mass that was PET avid.  Biopsy showed adenocarcinoma.  She was evaluated, and a good surgical candidate.  Findings: Left lower lobe mass was visible, with a biopsy tag.  Normal anatomy.    Discharge Exam: Blood pressure 122/65, pulse 75, temperature 98.7 F (37.1 C), temperature source Oral, resp. rate 17, height 5' 4.5" (1.638 m), weight 97.2 kg, last menstrual period 01/17/1994, SpO2 98 %.   General appearance: alert, cooperative and no distress Heart: regular rate and rhythm, S1, S2 normal, no murmur, click, rub or gallop Lungs: clear to auscultation bilaterally Abdomen: soft, non-tender; bowel sounds normal; no masses,  no organomegaly Extremities: extremities normal, atraumatic, no cyanosis or edema Wound: clean and  dry  Disposition: Discharge disposition: 01-Home or Self Care        Allergies as of 10/04/2019      Reactions   Advil [ibuprofen] Other (See Comments)   Rectal bleeding    Iron Other (See Comments)   Extreme constipation   Penicillins Rash   Did it involve swelling of the face/tongue/throat, SOB, or low BP? No Did it involve sudden or severe rash/hives, skin peeling, or any reaction on the inside of your mouth or nose? No Did you need to seek medical attention at a hospital or doctor's office? No When did it last happen?~20 years or so If all above answers are "NO", may proceed with cephalosporin use.   Tetracyclines & Related Nausea And Vomiting, Anxiety      Medication List    TAKE these medications   BLACK ELDERBERRY PO Take 2 capsules by mouth daily.   Magnesium 200 MG Tabs Take 200 mg by mouth daily.   N-ACETYL-L-CYSTEINE PO Take 1 tablet by mouth daily.   OVER THE COUNTER MEDICATION Take 1 capsule by mouth daily. Flamex Rx   traMADol 50 MG tablet Commonly known as: ULTRAM Take 1 tablet (50 mg total) by mouth every 6 (six) hours as needed for up to 7 days (mild pain).   VICKS VAPOR IN Place 1 spray into the nose as needed (congestion.).   Vitamin D-3 125 MCG (5000 UT) Tabs Take 10,000 Units by mouth daily.  Zinc 30 MG Tabs Take 30 mg by mouth daily.      Follow-up Information    Lightfoot, Lucile Crater, MD Follow up.   Specialty: Cardiothoracic Surgery Why: The office will contact you with an appointment for next Tuesday with a chest x-ray.  Obtain the chest x-ray at [facility] which is located in the same office complex 1/2-hour prior to the appointment. Contact information: [place] [address] [phone]           Signed: Elgie Collard, PA-C 10/05/2019, 8:57 AM

## 2019-10-02 NOTE — Discharge Instructions (Signed)

## 2019-10-03 ENCOUNTER — Inpatient Hospital Stay (HOSPITAL_COMMUNITY): Payer: BC Managed Care – PPO

## 2019-10-03 LAB — COMPREHENSIVE METABOLIC PANEL
ALT: 28 U/L (ref 0–44)
AST: 28 U/L (ref 15–41)
Albumin: 2.9 g/dL — ABNORMAL LOW (ref 3.5–5.0)
Alkaline Phosphatase: 55 U/L (ref 38–126)
Anion gap: 6 (ref 5–15)
BUN: 17 mg/dL (ref 8–23)
CO2: 27 mmol/L (ref 22–32)
Calcium: 8.6 mg/dL — ABNORMAL LOW (ref 8.9–10.3)
Chloride: 103 mmol/L (ref 98–111)
Creatinine, Ser: 0.98 mg/dL (ref 0.44–1.00)
GFR calc Af Amer: >60 mL/min (ref >60)
GFR calc non Af Amer: 59 mL/min — ABNORMAL LOW (ref >60)
Glucose, Bld: 104 mg/dL — ABNORMAL HIGH (ref 70–99)
Potassium: 3.9 mmol/L (ref 3.5–5.1)
Sodium: 136 mmol/L (ref 135–145)
Total Bilirubin: 0.3 mg/dL (ref 0.3–1.2)
Total Protein: 5.2 g/dL — ABNORMAL LOW (ref 6.5–8.1)

## 2019-10-03 LAB — CBC
HCT: 34.6 % — ABNORMAL LOW (ref 36.0–46.0)
Hemoglobin: 11 g/dL — ABNORMAL LOW (ref 12.0–15.0)
MCH: 28.9 pg (ref 26.0–34.0)
MCHC: 31.8 g/dL (ref 30.0–36.0)
MCV: 91.1 fL (ref 80.0–100.0)
Platelets: 171 10*3/uL (ref 150–400)
RBC: 3.8 MIL/uL — ABNORMAL LOW (ref 3.87–5.11)
RDW: 12.5 % (ref 11.5–15.5)
WBC: 9.4 10*3/uL (ref 4.0–10.5)
nRBC: 0 % (ref 0.0–0.2)

## 2019-10-03 MED ORDER — BISACODYL 10 MG RE SUPP
10.0000 mg | Freq: Once | RECTAL | Status: AC
  Administered 2019-10-03: 10 mg via RECTAL
  Filled 2019-10-03: qty 1

## 2019-10-03 NOTE — Progress Notes (Signed)
     [name]       [name] [address]             [phone]       Doing well Complains of constipation  Vitals:   10/03/19 0334 10/03/19 0743  BP: (!) 109/58 115/63  Pulse: 70 73  Resp:  (!) 21  Temp: (!) 97.5 F (36.4 C) 98.3 F (36.8 C)  SpO2: 98% 99%   Alert NAD Sinus EWOB, no leak on CT  CXR: Lung well expanded No effusion  POD 2 s/p Robotic assisted LLLectomy Doing well Will keep CT for now given high output.  Will remove once < 348mls On bowel regimen Continue pulm toilet. Home likely tomorrow  Sarah Lee

## 2019-10-04 ENCOUNTER — Inpatient Hospital Stay (HOSPITAL_COMMUNITY): Payer: BC Managed Care – PPO

## 2019-10-04 ENCOUNTER — Other Ambulatory Visit: Payer: Self-pay | Admitting: *Deleted

## 2019-10-04 MED ORDER — TRAMADOL HCL 50 MG PO TABS: 50.0000 mg | ORAL_TABLET | Freq: Four times a day (QID) | ORAL | 0 refills | Status: AC | PRN

## 2019-10-04 NOTE — Progress Notes (Signed)
Chest tube removed by Dr Kipp Brood, Patient tolerated well

## 2019-10-04 NOTE — Progress Notes (Signed)
      [address]       [place] [address]             [phone]      3 Days Post-Op Procedure(s) (LRB): VIDEO BRONCHOSCOPY (N/A) XI ROBOTIC ASSISTED THORASCOPY-LEFT LOWER LOBECTOMY (Left) Node Dissection (Left) Subjective: Feels good this morning and ready to go home.   Objective: Vital signs in last 24 hours: Temp:  [97.9 F (36.6 C)-99 F (37.2 C)] 98.4 F (36.9 C) (01/21 0328) Pulse Rate:  [81-114] 81 (01/20 2352) Cardiac Rhythm: Normal sinus rhythm (01/21 0700) Resp:  [14-24] 18 (01/21 0328) BP: (105-123)/(59-82) 123/59 (01/21 0328) SpO2:  [96 %-99 %] 97 % (01/21 0328)     Intake/Output from previous day: 01/20 0701 - 01/21 0700 In: 640 [P.O.:640] Out: 240 [Chest Tube:240] Intake/Output this shift: No intake/output data recorded.  General appearance: alert, cooperative and no distress Heart: regular rate and rhythm, S1, S2 normal, no murmur, click, rub or gallop Lungs: clear to auscultation bilaterally Abdomen: soft, non-tender; bowel sounds normal; no masses,  no organomegaly Extremities: extremities normal, atraumatic, no cyanosis or edema Wound: clean and dry  Lab Results: Recent Labs    10/02/19 0228 10/03/19 0157  WBC 8.8 9.4  HGB 12.0 11.0*  HCT 36.6 34.6*  PLT 161 171   BMET:  Recent Labs    10/02/19 0228 10/03/19 0157  NA 138 136  K 3.9 3.9  CL 103 103  CO2 26 27  GLUCOSE 138* 104*  BUN 9 17  CREATININE 0.83 0.98  CALCIUM 9.0 8.6*    PT/INR: No results for input(s): LABPROT, INR in the last 72 hours. ABG    Component Value Date/Time   PHART 7.378 10/02/2019 0422   HCO3 25.2 10/02/2019 0422   O2SAT 95.9 10/02/2019 0422   CBG (last 3)  No results for input(s): GLUCAP in the last 72 hours.  Assessment/Plan: S/P Procedure(s) (LRB): VIDEO BRONCHOSCOPY (N/A) XI ROBOTIC ASSISTED THORASCOPY-LEFT LOWER LOBECTOMY (Left) Node Dissection (Left) POD 3 s/p Robotic assisted LLLectomy  1. CV-NSR in the 70s, BP well  controlled. Hemodynamically stable 2. Pulm- chest tubes removed this morning. CXR showed:   FINDINGS: Interval removal of a left-sided chest tube. There is no significant left-sided pneumothorax. Elevation of the left hemidiaphragm and left basilar scarring or atelectasis. No acute appearing airspace opacity. The heart and mediastinum are unremarkable. Disc degenerative disease of the thoracic spine.  IMPRESSION: Interval removal of left-sided chest tube. No significant left-sided Pneumothorax.  3. Renal-creatinine 0.98, electrolytes okay 4. H and H stable 11.0/34.6 5. Endo-not a diabetic  Plan: Instructions for discharge discussed with the patient. She has a follow-up appointment for 1/29 with Dr. Kipp Brood. CXR is stable. Will discharge the patient today.         LOS: 3 days    Elgie Collard 10/04/2019

## 2019-10-04 NOTE — Progress Notes (Signed)
The proposed treatment discussed in cancer conference 10/04/19 is for discussion purpose only and is not a binding recommendation.  The patient was not physically examined nor present for their treatment options.  Therefore, final treatment plans cannot be decided.

## 2019-10-04 NOTE — Plan of Care (Signed)
  Problem: Education: Goal: Knowledge of General Education information will improve Description: Including pain rating scale, medication(s)/side effects and non-pharmacologic comfort measures Outcome: Progressing   Problem: Health Behavior/Discharge Planning: Goal: Ability to manage health-related needs will improve Outcome: Progressing   Problem: Clinical Measurements: Goal: Ability to maintain clinical measurements within normal limits will improve Outcome: Progressing   Problem: Education: Goal: Required Educational Video(s) Outcome: Progressing   Problem: Clinical Measurements: Goal: Postoperative complications will be avoided or minimized Outcome: Progressing   Problem: Skin Integrity: Goal: Demonstration of wound healing without infection will improve Outcome: Progressing

## 2019-10-10 ENCOUNTER — Other Ambulatory Visit: Payer: Self-pay | Admitting: Thoracic Surgery (Cardiothoracic Vascular Surgery)

## 2019-10-10 DIAGNOSIS — C3490 Malignant neoplasm of unspecified part of unspecified bronchus or lung: Secondary | ICD-10-CM

## 2019-10-12 ENCOUNTER — Encounter: Payer: Self-pay | Admitting: Thoracic Surgery (Cardiothoracic Vascular Surgery)

## 2019-10-12 ENCOUNTER — Ambulatory Visit (INDEPENDENT_AMBULATORY_CARE_PROVIDER_SITE_OTHER): Payer: Self-pay | Admitting: Thoracic Surgery (Cardiothoracic Vascular Surgery)

## 2019-10-12 ENCOUNTER — Other Ambulatory Visit: Payer: Self-pay

## 2019-10-12 ENCOUNTER — Ambulatory Visit
Admission: RE | Admit: 2019-10-12 | Discharge: 2019-10-12 | Disposition: A | Discharge status: Home / Self Care | Payer: BC Managed Care – PPO | Source: Ambulatory Visit | Attending: Thoracic Surgery (Cardiothoracic Vascular Surgery) | Admitting: Thoracic Surgery (Cardiothoracic Vascular Surgery)

## 2019-10-12 VITALS — BP 118/70 | HR 83 | Temp 97.7°F | Resp 20 | Ht 64.5 in | Wt 212.0 lb

## 2019-10-12 DIAGNOSIS — C3490 Malignant neoplasm of unspecified part of unspecified bronchus or lung: Secondary | ICD-10-CM

## 2019-10-12 DIAGNOSIS — Z902 Acquired absence of lung [part of]: Secondary | ICD-10-CM

## 2019-10-12 DIAGNOSIS — C3492 Malignant neoplasm of unspecified part of left bronchus or lung: Secondary | ICD-10-CM

## 2019-10-12 NOTE — Progress Notes (Signed)
Incisions to left back/left lateral chest are c/d/i and well approximated. There is one suture present to L lateral chest area near L breast, and it is removed per standard protocol w/o difficulty. Instructed pt to keep sites clean (w/ mild soap and water) and dry. To notify office of any s/s of infection or other problems.

## 2019-10-12 NOTE — Progress Notes (Signed)
      [address]       [place] [address]             [phone]        Louiza A [place] Medical Record #[id number] Date of Birth: 1950/11/27  Referring: Tanda Rockers, MD Primary Care: Willey Blade, MD Primary Cardiologist:No primary care provider on file.  Reason for visit:   follow-up  History of Present Illness:     Mrs. Stolar comes in for her first follow-up.  She has done well.  She denies any pain or respiratory symptoms.  Physical Exam: BP 118/70 (BP Location: Right Arm, Patient Position: Sitting, Cuff Size: Normal)   Pulse 83   Temp 97.7 F (36.5 C) (Skin)   Resp 20   Ht 5' 4.5" (1.638 m)   Wt 212 lb (96.2 kg)   LMP 01/17/1994   SpO2 95% Comment: RA  BMI 35.83 kg/m   Alert NAD Incision clean.   Abdomen soft, ND no peripheral edema   Diagnostic Studies & Laboratory data: CXR: lung well expanded.  No effusion Path:  FINAL MICROSCOPIC DIAGNOSIS:   A. LYMPH NODE, 9L, EXCISION:  - No carcinoma identified in one lymph node (0/1)   B. LYMPH NODE, 7, EXCISION:  - No carcinoma identified in one lymph node (0/1)   C. LYMPH NODE, 11L, EXCISION:  - No carcinoma identified in one lymph node (0/1)   D. LYMPH NODE, 11L #2, EXCISION:  - No carcinoma identified in one lymph node (0/1)   E. LYMPH NODE, 12L, EXCISION:  - No carcinoma identified in one lymph node (0/1)   F. LYMPH NODE, 12L #2, EXCISION:  - No carcinoma identified in one lymph node (0/1)   G. LUNG, LEFT LOWER LOBE, LOBECTOMY:  - Invasive adenosquamous carcinoma, moderately differentiated, 4.4 cm  - Carcinoma involves the pleura and diaphragm  - Margins uninvolved by carcinoma  - See oncology table and comment below   H. LYMPH NODE, 5, EXCISION:  - No carcinoma identified in one lymph node (0/1)   COMMENT:   The tumor has morphologic features of both squamous and adenocarcinoma.  This histologic impression is supported by immunohistochemistry    (cytokeratin 5/6, p40, TTF-1 and Napsin-A). Dr. Jeannie Done reviewed the  case and agrees with the above diagnosis.  ONCOLOGY TABLE:   LUNG: Resection   Procedure: Lobectomy  Specimen Laterality: Left  Tumor Site: Lower lobe of lung  Tumor Size: 4.4 cm  Tumor Focality: Unifocal  Histologic Type: Adenosquamous carcinoma  Visceral Pleura Invasion: Present  Lymphovascular Invasion: Not identified  Direct Invasion of Adjacent Structures: Adjacent structures present and  involved; diaphragm  Margins: Uninvolved by tumor  Treatment Effect: No know presurgical therapy  Regional Lymph Nodes:    Number of Lymph Nodes Involved: 0    Number of Lymph Nodes Examined: 7  Pathologic Stage Classification (pTNM, AJCC 8th Edition): pT4, pN0    Assessment / Plan:   69 yo female with with LLL NSCLC.  The report quotes her as a pT4, but there is no way that diaphragm was involved.  She is a T2bN0M0 Stage IIa patient.  She will be referred to medical oncology for further discussion. She will follow-up with me in 1 month   [name] 10/12/2019 3:02 PM

## 2019-10-15 ENCOUNTER — Encounter: Payer: Self-pay | Admitting: Internal Medicine

## 2019-10-15 ENCOUNTER — Telehealth: Payer: Self-pay | Admitting: Internal Medicine

## 2019-10-15 NOTE — Telephone Encounter (Signed)
Received a new pt referral from Dr. Kipp Brood for lung cancer. Sarah Lee has been scheduled to see Dr. Julien Nordmann on 2/9 at 2:15pm w/labs at 1:45pm. I lft the appt date and time on her voicemail. Letter mailed.

## 2019-10-16 LAB — ACID FAST CULTURE WITH REFLEXED SENSITIVITIES (MYCOBACTERIA): Acid Fast Culture: NEGATIVE

## 2019-10-22 ENCOUNTER — Other Ambulatory Visit: Payer: Self-pay

## 2019-10-22 ENCOUNTER — Other Ambulatory Visit: Payer: Self-pay | Admitting: Medical Oncology

## 2019-10-22 DIAGNOSIS — C3490 Malignant neoplasm of unspecified part of unspecified bronchus or lung: Secondary | ICD-10-CM

## 2019-10-23 ENCOUNTER — Inpatient Hospital Stay: Payer: BC Managed Care – PPO | Attending: Internal Medicine | Admitting: Medical

## 2019-10-23 ENCOUNTER — Inpatient Hospital Stay (HOSPITAL_BASED_OUTPATIENT_CLINIC_OR_DEPARTMENT_OTHER): Payer: BC Managed Care – PPO | Admitting: Internal Medicine

## 2019-10-23 ENCOUNTER — Encounter: Payer: Self-pay | Admitting: Internal Medicine

## 2019-10-23 ENCOUNTER — Encounter (HOSPITAL_COMMUNITY): Payer: Self-pay | Admitting: Internal Medicine

## 2019-10-23 ENCOUNTER — Other Ambulatory Visit: Payer: Self-pay

## 2019-10-23 VITALS — BP 114/60 | HR 83 | Temp 97.8°F | Resp 17 | Ht 64.5 in | Wt 212.4 lb

## 2019-10-23 DIAGNOSIS — G8918 Other acute postprocedural pain: Secondary | ICD-10-CM

## 2019-10-23 DIAGNOSIS — Z7189 Other specified counseling: Secondary | ICD-10-CM

## 2019-10-23 DIAGNOSIS — K219 Gastro-esophageal reflux disease without esophagitis: Secondary | ICD-10-CM

## 2019-10-23 DIAGNOSIS — C3432 Malignant neoplasm of lower lobe, left bronchus or lung: Secondary | ICD-10-CM | POA: Insufficient documentation

## 2019-10-23 DIAGNOSIS — J45909 Unspecified asthma, uncomplicated: Secondary | ICD-10-CM

## 2019-10-23 DIAGNOSIS — Z800 Family history of malignant neoplasm of digestive organs: Secondary | ICD-10-CM

## 2019-10-23 DIAGNOSIS — Z809 Family history of malignant neoplasm, unspecified: Secondary | ICD-10-CM

## 2019-10-23 DIAGNOSIS — Z87891 Personal history of nicotine dependence: Secondary | ICD-10-CM | POA: Diagnosis not present

## 2019-10-23 DIAGNOSIS — C3490 Malignant neoplasm of unspecified part of unspecified bronchus or lung: Secondary | ICD-10-CM

## 2019-10-23 DIAGNOSIS — C3492 Malignant neoplasm of unspecified part of left bronchus or lung: Secondary | ICD-10-CM

## 2019-10-23 DIAGNOSIS — Z5111 Encounter for antineoplastic chemotherapy: Secondary | ICD-10-CM

## 2019-10-23 DIAGNOSIS — Z9889 Other specified postprocedural states: Secondary | ICD-10-CM

## 2019-10-23 DIAGNOSIS — Z8042 Family history of malignant neoplasm of prostate: Secondary | ICD-10-CM

## 2019-10-23 LAB — CMP (CANCER CENTER ONLY)
ALT: 19 U/L (ref 0–44)
AST: 20 U/L (ref 15–41)
Albumin: 3.7 g/dL (ref 3.5–5.0)
Alkaline Phosphatase: 86 U/L (ref 38–126)
Anion gap: 9 (ref 5–15)
BUN: 15 mg/dL (ref 8–23)
CO2: 27 mmol/L (ref 22–32)
Calcium: 9.3 mg/dL (ref 8.9–10.3)
Chloride: 108 mmol/L (ref 98–111)
Creatinine: 0.84 mg/dL (ref 0.44–1.00)
GFR, Est AFR Am: >60 mL/min (ref >60)
GFR, Estimated: >60 mL/min (ref >60)
Glucose, Bld: 89 mg/dL (ref 70–99)
Potassium: 3.8 mmol/L (ref 3.5–5.1)
Sodium: 144 mmol/L (ref 135–145)
Total Bilirubin: 0.4 mg/dL (ref 0.3–1.2)
Total Protein: 6.9 g/dL (ref 6.5–8.1)

## 2019-10-23 LAB — CBC WITH DIFFERENTIAL (CANCER CENTER ONLY)
Abs Immature Granulocytes: 0.01 10*3/uL (ref 0.00–0.07)
Basophils Absolute: 0 10*3/uL (ref 0.0–0.1)
Basophils Relative: 0 %
Eosinophils Absolute: 0.7 10*3/uL — ABNORMAL HIGH (ref 0.0–0.5)
Eosinophils Relative: 11 %
HCT: 38.4 % (ref 36.0–46.0)
Hemoglobin: 12.4 g/dL (ref 12.0–15.0)
Immature Granulocytes: 0 %
Lymphocytes Relative: 23 %
Lymphs Abs: 1.4 10*3/uL (ref 0.7–4.0)
MCH: 29.4 pg (ref 26.0–34.0)
MCHC: 32.3 g/dL (ref 30.0–36.0)
MCV: 91 fL (ref 80.0–100.0)
Monocytes Absolute: 0.6 10*3/uL (ref 0.1–1.0)
Monocytes Relative: 9 %
Neutro Abs: 3.5 10*3/uL (ref 1.7–7.7)
Neutrophils Relative %: 57 %
Platelet Count: 266 10*3/uL (ref 150–400)
RBC: 4.22 MIL/uL (ref 3.87–5.11)
RDW: 13.4 % (ref 11.5–15.5)
WBC Count: 6.1 10*3/uL (ref 4.0–10.5)
nRBC: 0 % (ref 0.0–0.2)

## 2019-10-23 LAB — SURGICAL PATHOLOGY

## 2019-10-23 MED ORDER — FOLIC ACID 1 MG PO TABS: 1.0000 mg | ORAL_TABLET | Freq: Every day | ORAL | 4 refills | Status: DC

## 2019-10-23 MED ORDER — DEXAMETHASONE 4 MG PO TABS: ORAL_TABLET | ORAL | 0 refills | Status: DC

## 2019-10-23 MED ORDER — PROCHLORPERAZINE MALEATE 10 MG PO TABS: 10.0000 mg | ORAL_TABLET | Freq: Four times a day (QID) | ORAL | 0 refills | Status: DC | PRN

## 2019-10-23 MED ORDER — CYANOCOBALAMIN 1000 MCG/ML IJ SOLN: 1000.0000 ug | Freq: Once | INTRAMUSCULAR | Status: DC

## 2019-10-23 NOTE — Progress Notes (Signed)
[facility] [phone]   [phone]  CONSULT NOTE  REFERRING PHYSICIAN: Dr. Melodie Bouillon  REASON FOR CONSULTATION:  69 years old African-American female recently diagnosed with lung cancer.  HPI Sarah Lee is a 69 y.o. female with past medical history significant for allergy, asthma, anemia, GERD as well as remote history of smoking but quit 30 years ago.  The patient mentions that she was complaining of pain in the chest as well as lower back and right upper quadrant of the abdomen.  She was seen by her primary care physician Dr. Willey Blade and she had CT scan of the abdomen and pelvis performed on July 04, 2019 and that showed no acute process in the abdomen or pelvis and no explanation of the abdominal pain.  There was left lower lobe masslike consolidation that is new compared to remote chest radiograph.  The patient was referred to Dr. Melvyn Novas and a PET scan was performed on July 19, 2019 and it showed increased radiotracer uptake associated with the persistent left lower lobe lung mass which measured 5.4 x 5.2 cm with SUV max of 7.25.  There was no evidence for lymphadenopathy or distant metastatic disease.  The patient was referred to Dr. Kipp Brood and she underwent CT biopsy of the left lower lobe lung mass on September 03, 2019 and the final pathology was consistent with adenocarcinoma. She also had MRI of the brain on September 19, 2019 and it was negative for metastatic disease to the brain. On October 01, 2019 the patient underwent bronchoscopy, robotic assisted left video thoracotomy with left lower lobectomy and mediastinal lymph node sampling under the care of Dr. Kipp Brood. 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  The dissected lymph  nodes were negative for malignancy. The patient is recovering well from her surgery except for the soreness on the left side of the chest from the surgical scar.  She has mild cough but no significant shortness of breath or hemoptysis.  She denied having any recent weight loss or night sweats.  She has no nausea, vomiting, diarrhea or constipation.  She denied having any headache or visual changes. Family history significant for father with prostate cancer, mother had anemia, maternal cousin had stomach cancer and her uncle also had cancer. The patient is married and has 2 sons.  She used to work for a [employer] but currently unemployed.  She has a history of smoking for around 16 years and quit 30 years ago.  She drinks alcohol occasionally and no history of drug abuse.  HPI  Past Medical History:  Diagnosis Date  . Allergy   . Anemia   . Arthritis   . Asthma    as a child  . Blood transfusion   . Cancer ([facility])    lung cancer  . Cataract    slight  . Complication of anesthesia    used sodium pentothal and very hard to wake up and arm swelled up from medication  . Constipation   . Gallstones   . GERD (gastroesophageal reflux disease)   . Heart murmur    no issues  . Pneumonia   . Seasonal allergies     Past Surgical History:  Procedure Laterality Date  . ABDOMINAL HYSTERECTOMY  2004   fibroids--- TAH/BSO  . BREAST SURGERY     right  breast biopsy-benign  . CESAREAN SECTION    . CHOLECYSTECTOMY N/A 10/28/2016   Procedure: LAPAROSCOPIC CHOLECYSTECTOMY WITH INTRAOPERATIVE CHOLANGIOGRAM;  Surgeon: Jackolyn Confer, MD;  Location: [facility];  Service: General;  Laterality: N/A;  . COLONOSCOPY    . ERCP N/A 10/29/2016   Procedure: ENDOSCOPIC RETROGRADE CHOLANGIOPANCREATOGRAPHY (ERCP);  Surgeon: Doran Stabler, MD;  Location: [facility];  Service: Endoscopy;  Laterality: N/A;  . NODE DISSECTION Left 10/01/2019   Procedure: Node Dissection;  Surgeon: [name],  MD;  Location: [facility];  Service: Thoracic;  Laterality: Left;  . POLYPECTOMY    . UTERINE FIBROID SURGERY    . VIDEO BRONCHOSCOPY N/A 10/01/2019   Procedure: VIDEO BRONCHOSCOPY;  Surgeon: [name], MD;  Location: [facility];  Service: Thoracic;  Laterality: N/A;    Family History  Problem Relation Age of Onset  . Cancer Father        prostate  . Anemia Mother   . Stomach cancer Cousin        maternal side   . Stomach cancer Maternal Uncle   . Colon cancer Neg Hx   . Esophageal cancer Neg Hx   . Rectal cancer Neg Hx   . Colon polyps Neg Hx     Social History Social History   Tobacco Use  . Smoking status: Former Smoker    Packs/day: 1.50    Years: 25.00    Pack years: 37.50    Types: Cigarettes    Quit date: 03/02/1988    Years since quitting: 31.6  . Smokeless tobacco: Never Used  Substance Use Topics  . Alcohol use: Yes    Comment: wine occassionally  . Drug use: Never    Allergies  Allergen Reactions  . Advil [Ibuprofen] Other (See Comments)    Rectal bleeding   . Iron Other (See Comments)    Extreme constipation  . Penicillins Rash    Did it involve swelling of the face/tongue/throat, SOB, or low BP? No Did it involve sudden or severe rash/hives, skin peeling, or any reaction on the inside of your mouth or nose? No Did you need to seek medical attention at a hospital or doctor's office? No When did it last happen?~20 years or so If all above answers are "NO", may proceed with cephalosporin use.   . Tetracyclines & Related Nausea And Vomiting and Anxiety    Current Outpatient Medications  Medication Sig Dispense Refill  . Acetylcysteine (N-ACETYL-L-CYSTEINE PO) Take 1 tablet by mouth daily.     . Aromatic Inhalants (VICKS VAPOR IN) Place 1 spray into the nose as needed (congestion.).     Marland Kitchen BLACK ELDERBERRY PO Take 2 capsules by mouth daily.    . Cholecalciferol (VITAMIN D-3) 125 MCG (5000 UT) TABS Take 10,000 Units by mouth daily.    . Magnesium 200  MG TABS Take 200 mg by mouth daily.     Marland Kitchen OVER THE COUNTER MEDICATION Take 1 capsule by mouth daily. Flamex Rx    . Zinc 30 MG TABS Take 30 mg by mouth daily.     No current facility-administered medications for this visit.    Review of Systems  Constitutional: negative Eyes: negative Ears, nose, mouth, throat, and face: negative Respiratory: positive for cough and pleurisy/chest pain Cardiovascular: negative Gastrointestinal: negative Genitourinary:negative Integument/breast: negative Hematologic/lymphatic: negative Musculoskeletal:negative Neurological: negative Behavioral/Psych: negative Endocrine: negative Allergic/Immunologic: negative  Physical Exam  YQM:GNOIB, healthy, no distress, well nourished and well developed SKIN: skin color, texture, turgor are normal,  no rashes or significant lesions HEAD: Normocephalic, No masses, lesions, tenderness or abnormalities EYES: normal, PERRLA, Conjunctiva are pink and non-injected EARS: External ears normal, Canals clear OROPHARYNX:no exudate, no erythema and lips, buccal mucosa, and tongue normal  NECK: supple, no adenopathy, no JVD LYMPH:  no palpable lymphadenopathy, no hepatosplenomegaly BREAST:not examined LUNGS: clear to auscultation , and palpation HEART: regular rate & rhythm, no murmurs and no gallops ABDOMEN:abdomen soft, non-tender, normal bowel sounds and no masses or organomegaly BACK: No CVA tenderness, Range of motion is normal EXTREMITIES:no joint deformities, effusion, or inflammation, no edema  NEURO: alert & oriented x 3 with fluent speech, no focal motor/sensory deficits  PERFORMANCE STATUS: ECOG 1  LABORATORY DATA: Lab Results  Component Value Date   WBC 6.1 10/23/2019   HGB 12.4 10/23/2019   HCT 38.4 10/23/2019   MCV 91.0 10/23/2019   PLT 266 10/23/2019      Chemistry      Component Value Date/Time   NA 144 10/23/2019 1430   K 3.8 10/23/2019 1430   CL 108 10/23/2019 1430   CO2 27 10/23/2019  1430   BUN 15 10/23/2019 1430   CREATININE 0.84 10/23/2019 1430      Component Value Date/Time   CALCIUM 9.3 10/23/2019 1430   ALKPHOS 86 10/23/2019 1430   AST 20 10/23/2019 1430   ALT 19 10/23/2019 1430   BILITOT 0.4 10/23/2019 1430       RADIOGRAPHIC STUDIES: DG Chest 2 View  Result Date: 10/12/2019 CLINICAL DATA:  Lung carcinoma with recent VATS procedure EXAM: CHEST - 2 VIEW COMPARISON:  October 04, 2019 FINDINGS: There is consolidation throughout the left lower lobe with small left pleural effusion. Lungs elsewhere clear. Heart size and pulmonary vascularity are normal. No adenopathy. No bone lesions. IMPRESSION: Consolidation with volume loss throughout left lower lobe region with small left pleural effusion. Lungs elsewhere clear. Stable cardiac silhouette. No adenopathy. Electronically Signed   By: Lowella Grip III M.D.   On: 10/12/2019 11:48   DG Chest 2 View  Result Date: 10/04/2019 CLINICAL DATA:  Chest tube removal EXAM: CHEST - 2 VIEW COMPARISON:  10/03/2019 FINDINGS: Interval removal of a left-sided chest tube. There is no significant left-sided pneumothorax. Elevation of the left hemidiaphragm and left basilar scarring or atelectasis. No acute appearing airspace opacity. The heart and mediastinum are unremarkable. Disc degenerative disease of the thoracic spine. IMPRESSION: Interval removal of left-sided chest tube. No significant left-sided pneumothorax. Electronically Signed   By: Eddie Candle M.D.   On: 10/04/2019 08:58   DG Chest 2 View  Result Date: 10/01/2019 CLINICAL DATA:  Preoperative respiratory evaluation prior to resection of a LEFT LOWER LOBE lung mass, biopsy-proven adenocarcinoma. EXAM: CHEST - 2 VIEW COMPARISON:  09/03/2019 and earlier, including PET-CT 07/19/2019. FINDINGS: Cardiomediastinal silhouette unremarkable and unchanged. The mass in the LEFT LOWER LOBE is unchanged since the 09/03/2019 examination, biopsy-proven adenocarcinoma. Lungs remain clear  otherwise. No visible pleural effusions. Normal pulmonary vascularity. Mild degenerative changes involving the thoracic spine. IMPRESSION: No acute cardiopulmonary disease. Stable LEFT LOWER LOBE mass, biopsy-proven adenocarcinoma. Electronically Signed   By: Evangeline Dakin M.D.   On: 10/01/2019 09:59   DG CHEST PORT 1 VIEW  Result Date: 10/03/2019 CLINICAL DATA:  Status post left lower lobectomy. EXAM: PORTABLE CHEST 1 VIEW COMPARISON:  October 02, 2019. FINDINGS: Stable cardiomediastinal silhouette. Left-sided chest tube is unchanged in position. No pneumothorax is noted. Right lung is clear. Left basilar atelectasis is noted. No significant pleural effusion is  noted. Bony thorax is unremarkable. IMPRESSION: Stable position of left-sided chest tube without pneumothorax. Left basilar subsegmental atelectasis is noted. Electronically Signed   By: Marijo Conception M.D.   On: 10/03/2019 08:19   DG CHEST PORT 1 VIEW  Result Date: 10/02/2019 CLINICAL DATA:  Postop. Left chest tube. Post left VATS. EXAM: PORTABLE CHEST 1 VIEW COMPARISON:  Radiograph 09/30/2017 FINDINGS: Left chest tube remains in place. No visualized pneumothorax. Slightly lower lung volumes from prior exam. Minimal streaky left lung base atelectasis. Linear atelectasis in the right mid lung versus fissural thickening/fluid. No new airspace disease. No pleural fluid. Unchanged heart size and mediastinal contours. IMPRESSION: 1. Left chest tube remains in place. No visualized pneumothorax. 2. Low lung volumes with streaky left lung base atelectasis. Electronically Signed   By: Keith Rake M.D.   On: 10/02/2019 06:16   DG Chest Port 1 View  Result Date: 10/01/2019 CLINICAL DATA:  Postop EXAM: PORTABLE CHEST 1 VIEW COMPARISON:  10/01/2019, levin 01/31/2019, CT 07/04/2019 FINDINGS: Left-sided chest tube in place with tip projecting over the medial upper chest. Previously noted left lower lobe lung mass no longer visualized. Thickening along  the right fissure versus small amount of fissural fluid. No visible pneumothorax. Heart size within normal limits allowing for low lung volume IMPRESSION: Placement of left-sided chest tube as above. Previously noted left lower lobe lung mass not identified. No definitive pneumothorax. Minor fluid or thickening along the right fissure. Electronically Signed   By: Donavan Foil M.D.   On: 10/01/2019 15:47   Myocardial Perfusion Imaging  Result Date: 09/26/2019  Nuclear stress EF: 72%. The left ventricular ejection fraction is hyperdynamic (>65%).  There was no ST segment deviation noted during stress.  Defect 1: There is a small defect of moderate severity present in the apical inferior, apical lateral and apex location. This is most c/w chest and diaphragmatic attenuation artifact.  This is a low risk study. There is no evidence of ischemia or previous infarction. There is hyperdynamic left ventricular systolic function.  The study is normal.     ASSESSMENT: This is a very pleasant 69 years old African-American female recently diagnosed with a stage IIa (T2a, N0, M0) non-small cell lung cancer, adenosquamous carcinoma presented with left lower lobe lung mass status post left lower lobectomy with lymph node sampling on October 01, 2019 under the care of Dr. Kipp Brood. Her molecular studies were negative for EGFR mutation.   PLAN: I had a lengthy discussion with the patient today about her current disease stage, prognosis and treatment options. I reviewed her previous imaging studies as well as the pathology report and molecular studies. I had a lengthy discussion with the patient about her current treatment options.  She was given the option of continuous observation and monitoring versus proceeding with adjuvant systemic chemotherapy with cisplatin 75 mg/M2 and Alimta 500 mg/M2 every 3 weeks for a total of 4 cycles.  The patient is interested in proceeding with the adjuvant therapy. I discussed  with her the adverse effect of this treatment including but not limited to alopecia, myelosuppression, nausea and vomiting, peripheral neuropathy, liver or renal dysfunction. I will arrange for the patient to have a chemotherapy education class before the first dose of her treatment with vitamin B12 injection on the same day. I will also call her pharmacy with prescription for Compazine 10 mg p.o. every 6 hours as needed for nausea, folic acid 1 mg p.o. daily in addition to Decadron 4 mg p.o. twice  daily the day before, day of and day after chemotherapy. She is expected to start the first cycle of this treatment on November 13, 2019. The patient will come back for follow-up visit at that time. She was advised to call immediately if she has any concerning symptoms in the interval.  The patient voices understanding of current disease status and treatment options and is in agreement with the current care plan.  All questions were answered. The patient knows to call the clinic with any problems, questions or concerns. We can certainly see the patient much sooner if necessary.  Thank you so much for allowing me to participate in the care of Sarah Lee. I will continue to follow up the patient with you and assist in her care.  The total time spent in the appointment was 80 minutes.  Disclaimer: This note was dictated with voice recognition software. Similar sounding words can inadvertently be transcribed and may not be corrected upon review.   Eilleen Kempf October 23, 2019, 3:13 PM

## 2019-10-23 NOTE — Progress Notes (Signed)
START ON PATHWAY REGIMEN - Non-Small Cell Lung     A cycle is every 21 days:     Pemetrexed      Cisplatin   **Always confirm dose/schedule in your pharmacy ordering system**  Patient Characteristics: Stage IIA - Resected, Nonsquamous Cell AJCC T Category: T2b Current Disease Status: No Distant Mets or Local Recurrence AJCC N Category: N0 AJCC M Category: M0 AJCC 8 Stage Grouping: IIA Histology: Nonsquamous Cell Intent of Therapy: Curative Intent, Discussed with Patient

## 2019-10-24 NOTE — Progress Notes (Signed)
This came to me on your patient. Thanks, Lucianne Lei

## 2019-10-25 ENCOUNTER — Telehealth: Payer: Self-pay | Admitting: Internal Medicine

## 2019-10-25 NOTE — Telephone Encounter (Signed)
Scheduled per los called and left msg. Mailed printout  

## 2019-10-30 ENCOUNTER — Telehealth: Payer: Self-pay | Admitting: *Deleted

## 2019-10-30 NOTE — Telephone Encounter (Signed)
Oncology Nurse Navigator Documentation  Oncology Nurse Navigator Flowsheets 10/30/2019  Navigator Location [facility]  Navigator Encounter Type Telephone/I followed up on Sarah Lee's schedule.  I called to update her on schedule and set her up for navigator appt.  She verbalized understanding of up coming appts.   Telephone Outgoing Call  Treatment Phase Pre-Tx/Tx Discussion  Barriers/Navigation Needs Coordination of Care;Education  Education Other  Interventions Coordination of Care;Education  Acuity Level 2-Minimal Needs (1-2 Barriers Identified)  Coordination of Care Appts  Time Spent with Patient 30

## 2019-10-31 ENCOUNTER — Other Ambulatory Visit: Payer: Self-pay

## 2019-10-31 ENCOUNTER — Ambulatory Visit (INDEPENDENT_AMBULATORY_CARE_PROVIDER_SITE_OTHER): Payer: Self-pay | Admitting: Thoracic Surgery (Cardiothoracic Vascular Surgery)

## 2019-10-31 ENCOUNTER — Ambulatory Visit
Admission: RE | Admit: 2019-10-31 | Discharge: 2019-10-31 | Disposition: A | Discharge status: Home / Self Care | Payer: BC Managed Care – PPO | Source: Ambulatory Visit | Attending: Thoracic Surgery (Cardiothoracic Vascular Surgery) | Admitting: Thoracic Surgery (Cardiothoracic Vascular Surgery)

## 2019-10-31 ENCOUNTER — Encounter: Payer: Self-pay | Admitting: Thoracic Surgery (Cardiothoracic Vascular Surgery)

## 2019-10-31 ENCOUNTER — Telehealth: Payer: Self-pay

## 2019-10-31 VITALS — BP 126/80 | HR 76 | Temp 97.3°F | Resp 20 | Ht 64.5 in | Wt 211.0 lb

## 2019-10-31 DIAGNOSIS — Z902 Acquired absence of lung [part of]: Secondary | ICD-10-CM

## 2019-10-31 DIAGNOSIS — C3492 Malignant neoplasm of unspecified part of left bronchus or lung: Secondary | ICD-10-CM

## 2019-10-31 MED ORDER — PREGABALIN 25 MG PO CAPS: 25.0000 mg | ORAL_CAPSULE | Freq: Two times a day (BID) | ORAL | 0 refills | Status: DC

## 2019-10-31 NOTE — Telephone Encounter (Signed)
Pt calls office to report that she is having pain in her L chest/surgical site area which is now worse than right after surgery. She is s/p robotic assisted LLLobectomy on 10/01/19. Pt describes pain as constant, stabbing, and sometimes burning. She states she has been taking extra strength Tylenol routinely, but this only helps for about an hour. Reports recent shortness of breath and occasional "gurgling" sound from an incision site. No s/s of infection noted per pt. OV scheduled for today at 4:00.

## 2019-10-31 NOTE — Progress Notes (Signed)
     [address]       [place], [address]             [phone]       Sarah Lee comes in for a follow-up with the complaint of burning chest pain on the inside.  She denies any chest wall pain, and has no problems with the incisions.  Tylenol occasionally helps, but this does not last more than an hour.    On exam, he incisions are well healed.  Her breath sounds are good Her CXR is stable  Unclear source for her pain.  It does not appear pleuritic in nature.  It is very atypical.  I have given her a 2 week Rx for lyrica.  If this does not improve her pain, then I will order a CT chest for further evaluation.  Yvan Dority Bary Leriche

## 2019-11-06 ENCOUNTER — Other Ambulatory Visit: Payer: Self-pay | Admitting: Medical Oncology

## 2019-11-06 ENCOUNTER — Other Ambulatory Visit: Payer: Self-pay

## 2019-11-06 ENCOUNTER — Inpatient Hospital Stay (HOSPITAL_BASED_OUTPATIENT_CLINIC_OR_DEPARTMENT_OTHER): Payer: BC Managed Care – PPO | Admitting: *Deleted

## 2019-11-06 ENCOUNTER — Inpatient Hospital Stay: Payer: BC Managed Care – PPO

## 2019-11-06 DIAGNOSIS — C3492 Malignant neoplasm of unspecified part of left bronchus or lung: Secondary | ICD-10-CM

## 2019-11-06 DIAGNOSIS — Z5111 Encounter for antineoplastic chemotherapy: Secondary | ICD-10-CM

## 2019-11-06 DIAGNOSIS — C3432 Malignant neoplasm of lower lobe, left bronchus or lung: Secondary | ICD-10-CM | POA: Diagnosis not present

## 2019-11-06 MED ORDER — CYANOCOBALAMIN 1000 MCG/ML IJ SOLN
INTRAMUSCULAR | Status: AC
  Filled 2019-11-06: qty 1

## 2019-11-06 MED ORDER — CYANOCOBALAMIN 1000 MCG/ML IJ SOLN
1000.0000 ug | Freq: Once | INTRAMUSCULAR | Status: AC
  Administered 2019-11-06: 1000 ug via INTRAMUSCULAR

## 2019-11-06 NOTE — Patient Instructions (Signed)

## 2019-11-09 ENCOUNTER — Ambulatory Visit (INDEPENDENT_AMBULATORY_CARE_PROVIDER_SITE_OTHER): Payer: Self-pay | Admitting: Thoracic Surgery (Cardiothoracic Vascular Surgery)

## 2019-11-09 ENCOUNTER — Encounter: Payer: Self-pay | Admitting: Thoracic Surgery (Cardiothoracic Vascular Surgery)

## 2019-11-09 ENCOUNTER — Other Ambulatory Visit: Payer: Self-pay

## 2019-11-09 VITALS — BP 140/80 | HR 90 | Temp 97.6°F | Resp 20 | Ht 64.5 in | Wt 214.0 lb

## 2019-11-09 DIAGNOSIS — Z902 Acquired absence of lung [part of]: Secondary | ICD-10-CM

## 2019-11-09 DIAGNOSIS — C3492 Malignant neoplasm of unspecified part of left bronchus or lung: Secondary | ICD-10-CM

## 2019-11-09 NOTE — Progress Notes (Signed)
      [address]       [place] [address]             [phone]        [place] A [place] Medical Record #[id number] Date of Birth: 1951/03/31  Referring: Tanda Rockers, MD Primary Care: Willey Blade, MD Primary Cardiologist:No primary care provider on file.  Reason for visit:   follow-up  History of Present Illness:     Sarah Lee comes in today.  Her pain is much improved following the lyrica, and also wearing bras.  She is a bit reluctant to start chemotherapy.  She states that she would like to get her teeth removed first   Physical Exam: BP 140/80   Pulse 90   Temp 97.6 F (36.4 C) (Skin)   Resp 20   Ht 5' 4.5" (1.638 m)   Wt 214 lb (97.1 kg)   LMP 01/17/1994   SpO2 96%   BMI 36.17 kg/m   Alert NAD        Assessment / Plan:   S/p L RATS, LLLectomy for T2bN0M0 Stage IIA NSCLC Will follow-up with me in 6 months with CXR Will contact Onc about her reluctance to start adjuvant therapy   [name] 11/09/2019 3:50 PM

## 2019-11-12 ENCOUNTER — Telehealth: Payer: Self-pay | Admitting: *Deleted

## 2019-11-12 NOTE — Telephone Encounter (Signed)
Oncology Nurse Navigator Documentation  Oncology Nurse Navigator Flowsheets 11/12/2019  Abnormal Finding Date -  Confirmed Diagnosis Date -  Diagnosis Status -  Planned Course of Treatment -  Phase of Treatment -  Chemotherapy Actual Start Date: -  Navigator Follow Up Date: -  Navigator Follow Up Reason: -  Navigator Location [facility]  Navigator Encounter Type Telephone  Telephone Outgoing Call  Patient Visit Type -  Treatment Phase Pre-Tx/Tx Discussion  Barriers/Navigation Needs Education/I received a message from Dr. Kipp Brood that patient had questions and if I could call her and clarify.  I called but she was on her way to another doctor.  She asked that I call back.   Education Other  Interventions Education  Acuity Level 2-Minimal Needs (1-2 Barriers Identified)  Coordination of Care -  Education Method Verbal  Time Spent with Patient 15

## 2019-11-13 ENCOUNTER — Telehealth: Payer: Self-pay | Admitting: *Deleted

## 2019-11-13 ENCOUNTER — Encounter: Payer: Self-pay | Admitting: Internal Medicine

## 2019-11-13 NOTE — Progress Notes (Signed)
Called pt to introduce myself as her Arboriculturist.  Pt has 2 insurances so copay assistance shouldn't be needed.  I informed her of the [employer], went over what it covers and gave her the income requirement.  She was unsure of her household income so she will contact me if she would like to apply in the near future.  I emailed her my contact info.

## 2019-11-13 NOTE — Telephone Encounter (Signed)
Oncology Nurse Navigator Documentation  Oncology Nurse Navigator Flowsheets 11/13/2019  Abnormal Finding Date -  Confirmed Diagnosis Date -  Diagnosis Status -  Planned Course of Treatment -  Phase of Treatment -  Chemotherapy Actual Start Date: -  Navigator Follow Up Date: -  Navigator Follow Up Reason: -  Navigator Location [facility]  Navigator Encounter Type Telephone/Patient is concerned about her treatment due tooth infections. I listened as she explain.  I rescheduled her to see Dr. Julien Nordmann and cancelled her chemo per her request.   Telephone Incoming Call;Outgoing Call  Patient Visit Type -  Treatment Phase -  Barriers/Navigation Needs Coordination of Care;Education  Education Other  Interventions Coordination of Care;Education  Acuity Level 2-Minimal Needs (1-2 Barriers Identified)  Coordination of Care Appts  Education Method Verbal  Time Spent with Patient 30

## 2019-11-14 ENCOUNTER — Ambulatory Visit: Payer: BC Managed Care – PPO | Admitting: Physician Assistant

## 2019-11-14 ENCOUNTER — Other Ambulatory Visit: Payer: Self-pay

## 2019-11-14 ENCOUNTER — Ambulatory Visit: Payer: BC Managed Care – PPO

## 2019-11-14 ENCOUNTER — Encounter: Payer: Self-pay | Admitting: Internal Medicine

## 2019-11-14 ENCOUNTER — Other Ambulatory Visit: Payer: BC Managed Care – PPO

## 2019-11-14 ENCOUNTER — Inpatient Hospital Stay: Payer: BC Managed Care – PPO | Attending: Internal Medicine | Admitting: Internal Medicine

## 2019-11-14 VITALS — BP 133/72 | HR 78 | Temp 98.0°F | Resp 18 | Ht 64.5 in | Wt 213.9 lb

## 2019-11-14 DIAGNOSIS — Z5111 Encounter for antineoplastic chemotherapy: Secondary | ICD-10-CM | POA: Diagnosis not present

## 2019-11-14 DIAGNOSIS — C3492 Malignant neoplasm of unspecified part of left bronchus or lung: Secondary | ICD-10-CM | POA: Diagnosis not present

## 2019-11-14 DIAGNOSIS — C3432 Malignant neoplasm of lower lobe, left bronchus or lung: Secondary | ICD-10-CM | POA: Insufficient documentation

## 2019-11-14 DIAGNOSIS — K0510 Chronic gingivitis, plaque induced: Secondary | ICD-10-CM | POA: Diagnosis not present

## 2019-11-14 NOTE — Progress Notes (Signed)
[facility] [place] [phone]   Fax:([phone]  OFFICE PROGRESS NOTE  [address], [place] [address]  DIAGNOSIS: stage IIa (T2a, N0, M0) non-small cell lung cancer, adenosquamous carcinoma presented with left lower lobe lung mass   Her molecular studies were negative for EGFR mutation.  PRIOR THERAPY: Status post left lower lobectomy with lymph node sampling on October 01, 2019 under the care of Dr. Kipp Brood.  CURRENT THERAPY: Adjuvant systemic chemotherapy with cisplatin 75 mg/M2 and Alimta 500 mg/M2 every 3 weeks.  First dose expected December 11, 2019.  INTERVAL HISTORY: Sarah Lee 69 y.o. female returns to the clinic today for follow-up visit.  The patient is feeling fine today with no concerning complaints.  She was supposed to start the first cycle of her adjuvant systemic chemotherapy yesterday but she has a lot of dental issues and gum infection that need management and intervention before she proceed with her chemotherapy.  She denied having any current chest pain, shortness of breath, cough or hemoptysis.  She denied having any nausea, vomiting, diarrhea but has intermittent constipation.  She has no fever or chills.  She has no headache or visual changes.  She is here today for reevaluation and discussion of her treatment plan.  MEDICAL HISTORY: Past Medical History:  Diagnosis Date  . Allergy   . Anemia   . Arthritis   . Asthma    as a child  . Blood transfusion   . Cancer ([place])    lung cancer  . Cataract    slight  . Complication of anesthesia    used sodium pentothal and very hard to wake up and arm swelled up from medication  . Constipation   . Gallstones   . GERD (gastroesophageal reflux disease)   . Heart murmur    no issues  . Pneumonia   . Seasonal allergies     ALLERGIES:  is allergic to advil [ibuprofen]; iron; penicillins; and tetracyclines & related.  MEDICATIONS:  Current  Outpatient Medications  Medication Sig Dispense Refill  . Acetylcysteine (N-ACETYL-L-CYSTEINE PO) Take 1 tablet by mouth daily.     . Aromatic Inhalants (VICKS VAPOR IN) Place 1 spray into the nose as needed (congestion.).     Marland Kitchen BLACK ELDERBERRY PO Take 2 capsules by mouth daily.    . Cholecalciferol (VITAMIN D-3) 125 MCG (5000 UT) TABS Take 10,000 Units by mouth daily.    Marland Kitchen dexamethasone (DECADRON) 4 MG tablet 1 tablet p.o. twice daily the day before, day of and day after chemotherapy every 3 weeks 30 tablet 0  . folic acid (FOLVITE) 1 MG tablet Take 1 tablet (1 mg total) by mouth daily. 30 tablet 4  . Magnesium 200 MG TABS Take 200 mg by mouth daily.     Marland Kitchen OVER THE COUNTER MEDICATION Take 1 capsule by mouth daily. Flamex Rx    . pregabalin (LYRICA) 25 MG capsule Take 1 capsule (25 mg total) by mouth 2 (two) times daily. 30 capsule 0  . prochlorperazine (COMPAZINE) 10 MG tablet Take 1 tablet (10 mg total) by mouth every 6 (six) hours as needed for nausea or vomiting. 30 tablet 0  . Zinc 30 MG TABS Take 30 mg by mouth daily.     No current facility-administered medications for this visit.    SURGICAL HISTORY:  Past Surgical History:  Procedure Laterality Date  . ABDOMINAL HYSTERECTOMY  2004   fibroids--- TAH/BSO  .  BREAST SURGERY     right breast biopsy-benign  . CESAREAN SECTION    . CHOLECYSTECTOMY N/A 10/28/2016   Procedure: LAPAROSCOPIC CHOLECYSTECTOMY WITH INTRAOPERATIVE CHOLANGIOGRAM;  Surgeon: Jackolyn Confer, MD;  Location: [facility];  Service: General;  Laterality: N/A;  . COLONOSCOPY    . ERCP N/A 10/29/2016   Procedure: ENDOSCOPIC RETROGRADE CHOLANGIOPANCREATOGRAPHY (ERCP);  Surgeon: Doran Stabler, MD;  Location: [facility];  Service: Endoscopy;  Laterality: N/A;  . NODE DISSECTION Left 10/01/2019   Procedure: Node Dissection;  Surgeon: [name], MD;  Location: [facility];  Service: Thoracic;  Laterality: Left;  . POLYPECTOMY    . UTERINE FIBROID SURGERY    . VIDEO  BRONCHOSCOPY N/A 10/01/2019   Procedure: VIDEO BRONCHOSCOPY;  Surgeon: [name], MD;  Location: [facility];  Service: Thoracic;  Laterality: N/A;    REVIEW OF SYSTEMS:  A comprehensive review of systems was negative.   PHYSICAL EXAMINATION: General appearance: alert, cooperative and no distress Head: Normocephalic, without obvious abnormality, atraumatic Neck: no adenopathy, no JVD, supple, symmetrical, trachea midline and thyroid not enlarged, symmetric, no tenderness/mass/nodules Lymph nodes: Cervical, supraclavicular, and axillary nodes normal. Resp: clear to auscultation bilaterally Back: symmetric, no curvature. ROM normal. No CVA tenderness. Cardio: regular rate and rhythm, S1, S2 normal, no murmur, click, rub or gallop GI: soft, non-tender; bowel sounds normal; no masses,  no organomegaly Extremities: extremities normal, atraumatic, no cyanosis or edema  ECOG PERFORMANCE STATUS: 1 - Symptomatic but completely ambulatory  Blood pressure 133/72, pulse 78, temperature 98 F (36.7 C), temperature source Oral, resp. rate 18, height 5' 4.5" (1.638 m), weight 213 lb 14.4 oz (97 kg), last menstrual period 01/17/1994, SpO2 99 %.  LABORATORY DATA: Lab Results  Component Value Date   WBC 6.1 10/23/2019   HGB 12.4 10/23/2019   HCT 38.4 10/23/2019   MCV 91.0 10/23/2019   PLT 266 10/23/2019      Chemistry      Component Value Date/Time   NA 144 10/23/2019 1430   K 3.8 10/23/2019 1430   CL 108 10/23/2019 1430   CO2 27 10/23/2019 1430   BUN 15 10/23/2019 1430   CREATININE 0.84 10/23/2019 1430      Component Value Date/Time   CALCIUM 9.3 10/23/2019 1430   ALKPHOS 86 10/23/2019 1430   AST 20 10/23/2019 1430   ALT 19 10/23/2019 1430   BILITOT 0.4 10/23/2019 1430       RADIOGRAPHIC STUDIES: DG Chest 2 View  Result Date: 10/31/2019 CLINICAL DATA:  Increased pain status post left lower lobectomy EXAM: CHEST - 2 VIEW COMPARISON:  10/12/2019, 10/04/2019, 10/01/2019, CT  07/19/2019 FINDINGS: Right lung is clear. No significant interval radiographic change in appearance of the chest since 10/12/2019. Airspace opacity at the left lung base with suspected small left effusion and volume loss. Elevated left diaphragm. IMPRESSION: Persistent airspace opacity/consolidation at the left lung base with suspected small left effusion. Mild volume loss on the left. Findings do not appear significantly changed since 10/12/2019 but are progressed as compared with exams prior to this. Electronically Signed   By: Donavan Foil M.D.   On: 10/31/2019 15:48    ASSESSMENT AND PLAN: This is a very pleasant 69 years old African-American female recently diagnosed with a stage IIa non-small cell lung cancer, adenosquamous carcinoma status post left lower lobectomy with lymph node dissection on October 01, 2019 under the care of Dr. Kipp Brood. The patient was supposed to start adjuvant systemic chemotherapy with cisplatin and Alimta  yesterday but she has a lot of dental issues including gum infection and she will need significant intervention with teeth extractions. She was advised by her dentist not to proceed with chemotherapy at this point until he takes care of her dental issues. I explained to the patient that they can delay her chemo by 4 more weeks until completion of the dental work.  She understands that if she has any further delay in her adjuvant systemic chemotherapy will not be able to do it because she will be beyond the 12 weeks period of initiation of adjuvant systemic chemotherapy. I also gave her the option of continuous observation and close monitoring with no adjuvant treatment. The patient agreed to the current plan and she will come back for follow-up visit in 4 weeks for evaluation before initiation of her treatment. She was advised to call immediately if she has any other concerning symptoms in the interval. The patient voices understanding of current disease status and  treatment options and is in agreement with the current care plan.  All questions were answered. The patient knows to call the clinic with any problems, questions or concerns. We can certainly see the patient much sooner if necessary.  Disclaimer: This note was dictated with voice recognition software. Similar sounding words can inadvertently be transcribed and may not be corrected upon review.

## 2019-11-15 ENCOUNTER — Telehealth: Payer: Self-pay | Admitting: Internal Medicine

## 2019-11-15 NOTE — Telephone Encounter (Signed)
Scheduled per los. Called and left msg. Mailed printout  °

## 2019-11-23 ENCOUNTER — Encounter: Payer: Self-pay | Admitting: General Practice

## 2019-11-23 ENCOUNTER — Telehealth: Payer: Self-pay | Admitting: *Deleted

## 2019-11-23 ENCOUNTER — Other Ambulatory Visit: Payer: Self-pay | Admitting: Thoracic Surgery (Cardiothoracic Vascular Surgery)

## 2019-11-23 MED ORDER — PREGABALIN 25 MG PO CAPS: 25.0000 mg | ORAL_CAPSULE | Freq: Two times a day (BID) | ORAL | 0 refills | Status: DC

## 2019-11-23 NOTE — Telephone Encounter (Signed)
Oncology Nurse Navigator Documentation  Oncology Nurse Navigator Flowsheets 11/23/2019  Abnormal Finding Date -  Confirmed Diagnosis Date -  Diagnosis Status -  Planned Course of Treatment -  Phase of Treatment -  Chemotherapy Actual Start Date: -  Navigator Follow Up Date: -  Navigator Follow Up Reason: -  Navigator Location [facility]  Navigator Encounter Type Telephone/Ms. Vegh called and left vm message.  I called and spoke with her.  She states she is unable to find a dentist to take care of oral decay.  I listened as she explained.  She continued to state she can't pay 4 k for dental work and would like a CSW to call and help.  She states it may be April before she can get any oral work done.  I told her I will update CSW and Dr. Julien Nordmann to see if they had any other suggestions.   Telephone Incoming Call;Outgoing Call  Patient Visit Type -  Treatment Phase -  Barriers/Navigation Needs Coordination of Care;Education  Education Other  Interventions Coordination of Care;Education  Acuity Level 2-Minimal Needs (1-2 Barriers Identified)  Coordination of Care Other  Education Method Verbal  Time Spent with Patient 30

## 2019-11-23 NOTE — Progress Notes (Signed)
[facility] CSW Progress Notes  Referral received from [facility], [place] asked to reach out to patient to explore needs related to barriers to getting dental care.  Was in process of getting extensive dental work, began last year. Was interrupted by diagnostic process and work up for current diagnosis of lung cancer.  Feels she is "between a rock and hard place" regarding dental work which must be done prior to initiating cancer therapy (which must be done prior to 12/11/19).  Needs two procedures - getting 8 teeth surgically removed, healing from procedure then eventually getting dentures so she "can eat."    Began dental work for teeth "that are stuck in my gums" w Dr Ventura Bruns [place] in [place] for initial dental work.  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  Has been referred to local oral surgeon - Dr Lewanda Rife - this practice does not bill medical insurance for any procedures.  Patient would need to pay up front for work done, then seek reimbursement from either her medical or dental coverage.  Cannot be guaranteed work would be covered under medical.  States she would be facing $4K bill which she cannot afford and would exhaust her dental coverage for 2021, leaving her no coverage for dentures she will need to replace teeth extracted.  She is aware of the deadline for initiating recommended oncology treatment - has been trying to find local oral surgeon who might accept her insurance and do the work in a timely manner.  She has been told that no local oral surgeons are willing to bill medical insurance for their work - they will only accept dental insurance and expect patient to pay the balance of the bill themselves.  She has worked extensively w her [employer], but thus far has not come up w solution that she can afford.  CSW will explore options and communicate w  patient/treatment team,  Edwyna Shell, LCSW Clinical Social Worker Phone:  [phone] Cell:  [phone]

## 2019-11-26 ENCOUNTER — Encounter: Payer: Self-pay | Admitting: *Deleted

## 2019-11-26 DIAGNOSIS — K047 Periapical abscess without sinus: Secondary | ICD-10-CM

## 2019-11-26 NOTE — Progress Notes (Signed)
Oncology Nurse Navigator Documentation  Oncology Nurse Navigator Flowsheets 11/26/2019  Abnormal Finding Date -  Confirmed Diagnosis Date -  Diagnosis Status -  Planned Course of Treatment -  Phase of Treatment -  Chemotherapy Actual Start Date: -  Navigator Follow Up Date: -  Navigator Follow Up Reason: -  Navigator Location [facility]  Navigator Encounter Type Other/patient is having a hard time getting in with a dentist.  I updated Dr. Julien Nordmann and made referral to [facility] dental clinic.   Telephone -  Patient Visit Type -  Treatment Phase -  Barriers/Navigation Needs Coordination of Care  Education -  Interventions Coordination of Care  Acuity Level 2-Minimal Needs (1-2 Barriers Identified)  Coordination of Care Other  Education Method -  Time Spent with Patient 15

## 2019-11-29 ENCOUNTER — Encounter (HOSPITAL_COMMUNITY): Payer: Self-pay | Admitting: Dentistry

## 2019-11-29 ENCOUNTER — Ambulatory Visit (HOSPITAL_COMMUNITY): Payer: Self-pay | Admitting: Dentistry

## 2019-11-29 ENCOUNTER — Other Ambulatory Visit: Payer: Self-pay

## 2019-11-29 VITALS — BP 128/71 | HR 68 | Temp 98.6°F

## 2019-11-29 DIAGNOSIS — K06010 Localized gingival recession, unspecified: Secondary | ICD-10-CM

## 2019-11-29 DIAGNOSIS — K117 Disturbances of salivary secretion: Secondary | ICD-10-CM

## 2019-11-29 DIAGNOSIS — C3492 Malignant neoplasm of unspecified part of left bronchus or lung: Secondary | ICD-10-CM

## 2019-11-29 DIAGNOSIS — K029 Dental caries, unspecified: Secondary | ICD-10-CM

## 2019-11-29 DIAGNOSIS — Z01818 Encounter for other preprocedural examination: Secondary | ICD-10-CM

## 2019-11-29 DIAGNOSIS — M264 Malocclusion, unspecified: Secondary | ICD-10-CM

## 2019-11-29 DIAGNOSIS — F40232 Fear of other medical care: Secondary | ICD-10-CM

## 2019-11-29 DIAGNOSIS — M270 Developmental disorders of jaws: Secondary | ICD-10-CM

## 2019-11-29 DIAGNOSIS — K083 Retained dental root: Secondary | ICD-10-CM

## 2019-11-29 DIAGNOSIS — K036 Deposits [accretions] on teeth: Secondary | ICD-10-CM

## 2019-11-29 DIAGNOSIS — K0530 Chronic periodontitis, unspecified: Secondary | ICD-10-CM

## 2019-11-29 DIAGNOSIS — M2607 Excessive tuberosity of jaw: Secondary | ICD-10-CM

## 2019-11-29 DIAGNOSIS — K045 Chronic apical periodontitis: Secondary | ICD-10-CM

## 2019-11-29 DIAGNOSIS — K08409 Partial loss of teeth, unspecified cause, unspecified class: Secondary | ICD-10-CM

## 2019-11-29 NOTE — Progress Notes (Signed)
DENTAL CONSULTATION  Date of Consultation:  11/29/2019 Patient Name:   Sarah Lee Date of Birth:   06-08-1951 Medical Record Number: [id number]  COVID 19 SCREENING: The patient does not symptoms concerning for COVID-19 infection (Including fever, chills, cough, or new SHORTNESS OF BREATH).    VITALS: BP 128/71 (BP Location: Right Arm)   Pulse 68   Temp 98.6 F (37 C)   LMP 01/17/1994   CHIEF COMPLAINT: Patient referred by Dr. Julien Nordmann for dental consultation.  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  Patient with anticipated chemotherapy with Dr. Earlie Server.  Patient is now seen as part of a medically necessary prechemotherapy dental protocol examination.  The patient denies having any acute pain, swelling, or abscesses.  Patient last saw Dr. Noah Charon for multiple dental restorations of her lower teeth.  The last treatment episode was on November 13, 2019.  Patient had been previously referred to an oral surgeon, Dr. Lewanda Rife, for extraction of remaining maxillary teeth with alveoloplasty in November 2020.  Patient did not follow-up with that dental treatment, however.  Patient denies having partial dentures.  Patient indicates that she does have some dental phobia.  PROBLEM LIST: Patient Active Problem List   Diagnosis Date Noted  . Adenosquamous carcinoma of left lung ([place]) 10/23/2019    Priority: High  . Encounter for antineoplastic chemotherapy 10/23/2019  . Goals of care, counseling/discussion 10/23/2019  . S/P thoracotomy 10/01/2019  . Lung cancer ([place]) 09/18/2019  . Pulmonary infiltrate present on computed tomography 07/11/2019  . Abdominal pain 07/11/2019  . Post ERCP Pancreatitis 10/30/2016  . Asthma   . GERD (gastroesophageal reflux disease)   . Choledocholithiasis s/p ERCP 10/29/2016   . Chronic cholecystitis with calculus s/p lap cholecystectomy 10/28/2016 10/28/2016  . Hemorrhoids 10/12/2012  . Obesity  05/20/2011  . Constipation 05/20/2011    PMH: Past Medical History:  Diagnosis Date  . Allergy   . Anemia   . Arthritis   . Asthma    as a child  . Blood transfusion   . Cancer ([place])    lung cancer  . Cataract    slight  . Complication of anesthesia    used sodium pentothal and very hard to wake up and arm swelled up from medication  . Constipation   . Gallstones   . GERD (gastroesophageal reflux disease)   . Heart murmur    no issues  . Pneumonia   . Seasonal allergies     PSH: Past Surgical History:  Procedure Laterality Date  . ABDOMINAL HYSTERECTOMY  2004   fibroids--- TAH/BSO  . BREAST SURGERY     right breast biopsy-benign  . CESAREAN SECTION    . CHOLECYSTECTOMY N/A 10/28/2016   Procedure: LAPAROSCOPIC CHOLECYSTECTOMY WITH INTRAOPERATIVE CHOLANGIOGRAM;  Surgeon: Jackolyn Confer, MD;  Location: [facility];  Service: General;  Laterality: N/A;  . COLONOSCOPY    . ERCP N/A 10/29/2016   Procedure: ENDOSCOPIC RETROGRADE CHOLANGIOPANCREATOGRAPHY (ERCP);  Surgeon: Doran Stabler, MD;  Location: [facility];  Service: Endoscopy;  Laterality: N/A;  . NODE DISSECTION Left 10/01/2019   Procedure: Node Dissection;  Surgeon: [name], MD;  Location: [facility];  Service: Thoracic;  Laterality: Left;  . POLYPECTOMY    . UTERINE FIBROID SURGERY    . VIDEO BRONCHOSCOPY N/A 10/01/2019   Procedure: VIDEO BRONCHOSCOPY;  Surgeon: [name], MD;  Location: [facility];  Service: Thoracic;  Laterality: N/A;    ALLERGIES:  Allergies  Allergen Reactions  . Advil [Ibuprofen] Other (See Comments)    Rectal bleeding   . Iron Other (See Comments)    Extreme constipation  . Penicillins Rash    Did it involve swelling of the face/tongue/throat, SOB, or low BP? No Did it involve sudden or severe rash/hives, skin peeling, or any reaction on the inside of your mouth or nose? No Did you need to seek medical attention at a hospital or doctor's office? No When did it last  happen?~20 years or so If all above answers are "NO", may proceed with cephalosporin use.   . Tetracyclines & Related Nausea And Vomiting and Anxiety    MEDICATIONS: Current Outpatient Medications  Medication Sig Dispense Refill  . Acetylcysteine (N-ACETYL-L-CYSTEINE PO) Take 1 tablet by mouth daily.     . Aromatic Inhalants (VICKS VAPOR IN) Place 1 spray into the nose as needed (congestion.).     Marland Kitchen BLACK ELDERBERRY PO Take 2 capsules by mouth daily.    . Cholecalciferol (VITAMIN D-3) 125 MCG (5000 UT) TABS Take 10,000 Units by mouth daily.    Marland Kitchen dexamethasone (DECADRON) 4 MG tablet 1 tablet p.o. twice daily the day before, day of and day after chemotherapy every 3 weeks 30 tablet 0  . folic acid (FOLVITE) 1 MG tablet Take 1 tablet (1 mg total) by mouth daily. 30 tablet 4  . Magnesium 200 MG TABS Take 200 mg by mouth daily.     Marland Kitchen OVER THE COUNTER MEDICATION Take 1 capsule by mouth daily. Flamex Rx    . pregabalin (LYRICA) 25 MG capsule Take 1 capsule (25 mg total) by mouth 2 (two) times daily. 60 capsule 0  . prochlorperazine (COMPAZINE) 10 MG tablet Take 1 tablet (10 mg total) by mouth every 6 (six) hours as needed for nausea or vomiting. 30 tablet 0  . Zinc 30 MG TABS Take 30 mg by mouth daily.     No current facility-administered medications for this visit.    LABS: Lab Results  Component Value Date   WBC 6.1 10/23/2019   HGB 12.4 10/23/2019   HCT 38.4 10/23/2019   MCV 91.0 10/23/2019   PLT 266 10/23/2019      Component Value Date/Time   NA 144 10/23/2019 1430   K 3.8 10/23/2019 1430   CL 108 10/23/2019 1430   CO2 27 10/23/2019 1430   GLUCOSE 89 10/23/2019 1430   BUN 15 10/23/2019 1430   CREATININE 0.84 10/23/2019 1430   CALCIUM 9.3 10/23/2019 1430   GFRNONAA >60 10/23/2019 1430   GFRAA >60 10/23/2019 1430   Lab Results  Component Value Date   INR 1.0 09/27/2019   INR 0.9 09/03/2019   No results found for: PTT  SOCIAL HISTORY: Social History   Socioeconomic  History  . Marital status: Married    Spouse name: Not on file  . Number of children: Not on file  . Years of education: Not on file  . Highest education level: Not on file  Occupational History    Comment: unemployed  Tobacco Use  . Smoking status: Former Smoker    Packs/day: 1.50    Years: 25.00    Pack years: 37.50    Types: Cigarettes    Quit date: 03/02/1988    Years since quitting: 31.7  . Smokeless tobacco: Never Used  Substance and Sexual Activity  . Alcohol use: Yes    Comment: wine occassionally  . Drug use: Never  . Sexual activity: Not Currently  Other Topics Concern  . Not on file  Social History Narrative   Exercise--  no   Social Determinants of Health   Financial Resource Strain:   . Difficulty of Paying Living Expenses:   Food Insecurity:   . Worried About Charity fundraiser in the Last Year:   . Arboriculturist in the Last Year:   Transportation Needs:   . Film/video editor (Medical):   Marland Kitchen Lack of Transportation (Non-Medical):   Physical Activity:   . Days of Exercise per Week:   . Minutes of Exercise per Session:   Stress:   . Feeling of Stress :   Social Connections:   . Frequency of Communication with Friends and Family:   . Frequency of Social Gatherings with Friends and Family:   . Attends Religious Services:   . Active Member of Clubs or Organizations:   . Attends Archivist Meetings:   Marland Kitchen Marital Status:   Intimate Partner Violence:   . Fear of Current or Ex-Partner:   . Emotionally Abused:   Marland Kitchen Physically Abused:   . Sexually Abused:     FAMILY HISTORY: Family History  Problem Relation Age of Onset  . Cancer Father        prostate  . Anemia Mother   . Stomach cancer Cousin        maternal side   . Stomach cancer Maternal Uncle   . Colon cancer Neg Hx   . Esophageal cancer Neg Hx   . Rectal cancer Neg Hx   . Colon polyps Neg Hx     REVIEW OF SYSTEMS: Reviewed with the patient as per History of present  illness. Psych: Patient does have some dental phobia.  DENTAL HISTORY: CHIEF COMPLAINT: Patient referred by Dr. Julien Nordmann for dental consultation.  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  Patient with anticipated chemotherapy with Dr. Earlie Server.  Patient is now seen as part of a medically necessary prechemotherapy dental protocol examination.  The patient denies having any acute pain, swelling, or abscesses.  Patient last saw Dr. Noah Charon for multiple dental restorations of her lower teeth.  The last treatment episode was on November 13, 2019.  Patient had been previously referred to an oral surgeon, Dr. Lewanda Rife, for extraction of remaining maxillary teeth with alveoloplasty in November 2020.  Patient did not follow-up with that dental treatment, however.  Patient denies having partial dentures.  Patient indicates that she does have some dental phobia.  DENTAL EXAMINATION: GENERAL: Patient is a well-developed, well-nourished female no acute distress. HEAD AND NECK: There is no palpable neck lymphadenopathy.  The patient denies acute TMJ symptoms. INTRAORAL EXAM: Patient has incipient xerostomia.  There is no evidence of oral abscess formation.  Patient has excessive bilateral maxillary tuberosities. Mid palatal torus that extends near the vibrating line. DENTITION: Patient is missing tooth numbers 1-3, 5, 6, 14 -16, 17-20, and 29-32.  There are retained root segments in the area of tooth numbers 4, 7, 8, 11, 12, and 13.  There is mandibular anterior incisal attrition. PERIODONTAL: Patient has chronic periodontitis with plaque and calculus accumulations, gingival recession, and incipient to moderate bone loss. DENTAL CARIES/SUBOPTIMAL RESTORATIONS: Dental caries are noted as per dental charting form. ENDODONTIC: The patient currently denies acute pulpitis symptoms.  The patient does have periapical pathology and radiolucency associate  with tooth numbers 9 and 10.  Patient has had previous root canal therapy associated  with tooth numbers 4, 7, 8, 12, and 13 that are now exposed to the oral environment. CROWN AND BRIDGE: There are no crown or bridge restorations noted. PROSTHODONTIC: Patient denies having partial dentures. OCCLUSION: Patient has a poor occlusal scheme secondary to multiple missing teeth, multiple retained root segments, and lack replacement of the missing teeth with clinically acceptable dental prostheses.  RADIOGRAPHIC INTERPRETATION: An orthopantogram was taken today and supplemented with 10 periapical radiographs. There are multiple missing teeth.  There are multiple retained root segments.  There are multiple areas of periapical pathology and radiolucency.  Multiple dental caries are noted.  There is incipient a moderate bone loss noted.   ASSESSMENTS: 1.  Adenosquamous carcinoma of the left lung 2.  Prechemotherapy dental protocol 3.  Chronic apical periodontitis 4.  Multiple retained root segments 5.  Dental caries 6.  Mandibular anterior incisal attrition 7.  Chronic periodontitis with bone loss 8.  Gingival recession 9.  Accretions 10.  Multiple missing teeth 11.  No history of partial dentures 12.  Poor occlusal scheme and malocclusion 13.  Dental phobia 14.  Incipient xerostomia 15.  Mid palatal torus  PLAN/RECOMMENDATIONS: 1. I discussed the risks, benefits, and complications of various treatment options with the patient in relationship to her medical and dental conditions, anticipated chemotherapy, and chemotherapy side effects to include xerostomia, taste changes, gum and jawbone changes, and risk for infection and bleeding. We discussed various treatment options to include no treatment, multiple extractions with alveoloplasty, pre-prosthetic surgery as indicated, periodontal therapy, dental restorations, root canal therapy, crown and bridge therapy, implant therapy, and replacement of  missing teeth as indicated. The patient currently wishes to proceed with extraction of all remaining maxillary teeth with alveoloplasty and bilateral maxillary excessive tuberosity reductions in the operating room with general anesthesia.  This has been scheduled for Thursday, December 06, 2019 at 7:30 AM at [facility].  The patient will then follow-up with her primary dentist, Dr. Noah Charon, for evaluation for replacing the missing teeth after adequate healing and once medically stable from the anticipated chemotherapy.  Chemotherapy will start at the discretion of Dr. Earlie Server.  The tentative start date is December 11, 2019.  2. Discussion of findings with medical team and coordination of future medical and dental care as needed.  I spent in excess of  120  minutes during the conduct of this consultation and >50% of this time involved direct face-to-face encounter for counseling and/or coordination of the patient's care.    Lenn Cal, DDS

## 2019-11-29 NOTE — Patient Instructions (Signed)
COVID-19 Education: The signs and symptoms of COVID-19 were discussed with the patient and how to seek care for testing (follow up with PCP or arrange E-visit).   The importance of social distancing was discussed today.  COVID-19 Vaccine Information can be found at: [email or web address].uk For questions related to vaccine distribution or appointments, please email [email or web address] or call [phone].

## 2019-11-30 NOTE — Progress Notes (Signed)
[facility] [address], [place] [address] Phone: [phone] Fax: [phone]      Your procedure is scheduled on December 06, 2019.  Report to [facility] Main Entrance "A" at 5:30 A.M., and check in at the Admitting office.  Call this number if you have problems the morning of surgery:  [phone]  Call [phone] if you have any questions prior to your surgery date Monday-Friday 8am-4pm    Remember:  Do not eat or drink after midnight the night before your surgery    Take these medicines the morning of surgery with A SIP OF WATER: Acetylcysteine (N-ACETYL-L-CYSTEINE PO) pregabalin (LYRICA)   Follow your surgeon's instructions on when to stop Aspirin.  If no instructions were given by your surgeon then you will need to call the office to get those instructions.     As of today, stop taking all Aspirin (unless instructed by your doctor) and other Aspirin containing products, Vitamins, Fish Oils, and Herbal Medications. Also stop all NSAIDS i.e. Advil, Ibuprofen, Motrin, Aleve, Anaprox, Naproxen, BC, Goody Powders, and all Supplements.   No Smoking of any kind, Tobacco, or Alcohol products 24 hours prior to your procedure. If you use a CPAP at night, you may bring all equipment for your overnight stay.                        Do not wear jewelry, make up, or nail polish            Do not wear lotions, powders, perfumes/colognes, or deodorant.            Do not shave 48 hours prior to surgery.  Men may shave face and neck.            Do not bring valuables to the hospital.            [facility] is not responsible for any belongings or valuables.   Contacts, glasses, dentures or bridgework may not be worn into surgery.      For patients admitted to the hospital, discharge time will be determined by your treatment team.   Patients discharged the day of surgery will not be allowed to drive home,  and someone needs to stay with them for 24 hours.    Special instructions:   [facility]- Preparing For Surgery  Before surgery, you can play an important role. Because skin is not sterile, your skin needs to be as free of germs as possible. You can reduce the number of germs on your skin by washing with CHG (chlorahexidine gluconate) Soap before surgery.  CHG is an antiseptic cleaner which kills germs and bonds with the skin to continue killing germs even after washing.    Oral Hygiene is also important to reduce your risk of infection.  Remember - BRUSH YOUR TEETH THE MORNING OF SURGERY WITH YOUR REGULAR TOOTHPASTE  Please do not use if you have an allergy to CHG or antibacterial soaps. If your skin becomes reddened/irritated stop using the CHG.  Do not shave (including legs and underarms) for at least 48 hours prior to first CHG shower. It is OK to shave your face.  Please follow these instructions carefully.   1. Shower the NIGHT BEFORE SURGERY and the MORNING OF SURGERY with CHG Soap.   2. If you chose to wash your hair, wash your hair first as usual with your  normal shampoo.  3. After you shampoo, rinse your hair and body thoroughly to remove the shampoo.  4. Use CHG as you would any other liquid soap. You can apply CHG directly to the skin and wash gently with a scrungie or a clean washcloth.   5. Apply the CHG Soap to your body ONLY FROM THE NECK DOWN.  Do not use on open wounds or open sores. Avoid contact with your eyes, ears, mouth and genitals (private parts). Wash Face and genitals (private parts)  with your normal soap.   6. Wash thoroughly, paying special attention to the area where your surgery will be performed.  7. Thoroughly rinse your body with warm water from the neck down.  8. DO NOT shower/wash with your normal soap after using and rinsing off the CHG Soap.  9. Pat yourself dry with a CLEAN TOWEL.  10. Wear CLEAN PAJAMAS to bed the night before surgery, wear  comfortable clothes the morning of surgery  11. Place CLEAN SHEETS on your bed the night of your first shower and DO NOT SLEEP WITH PETS.   Day of Surgery:   Do not apply any deodorants/lotions.  Please wear clean clothes to the hospital/surgery center.   Remember to brush your teeth WITH YOUR REGULAR TOOTHPASTE.   Please read over the following fact sheets that you were given.

## 2019-12-03 ENCOUNTER — Other Ambulatory Visit (HOSPITAL_COMMUNITY)
Admission: RE | Admit: 2019-12-03 | Discharge: 2019-12-03 | Disposition: A | Discharge status: Home / Self Care | Payer: BC Managed Care – PPO | Source: Ambulatory Visit | Attending: Dentistry | Admitting: Dentistry

## 2019-12-03 ENCOUNTER — Encounter (HOSPITAL_COMMUNITY): Payer: Self-pay

## 2019-12-03 ENCOUNTER — Encounter (HOSPITAL_COMMUNITY)
Admission: RE | Admit: 2019-12-03 | Discharge: 2019-12-03 | Disposition: A | Discharge status: Home / Self Care | Payer: BC Managed Care – PPO | Source: Ambulatory Visit | Attending: Dentistry | Admitting: Dentistry

## 2019-12-03 ENCOUNTER — Other Ambulatory Visit: Payer: Self-pay

## 2019-12-03 DIAGNOSIS — Z20822 Contact with and (suspected) exposure to covid-19: Secondary | ICD-10-CM | POA: Diagnosis not present

## 2019-12-03 DIAGNOSIS — Z01812 Encounter for preprocedural laboratory examination: Secondary | ICD-10-CM | POA: Insufficient documentation

## 2019-12-03 LAB — CBC
HCT: 40.8 % (ref 36.0–46.0)
Hemoglobin: 13 g/dL (ref 12.0–15.0)
MCH: 29.6 pg (ref 26.0–34.0)
MCHC: 31.9 g/dL (ref 30.0–36.0)
MCV: 92.9 fL (ref 80.0–100.0)
Platelets: 193 10*3/uL (ref 150–400)
RBC: 4.39 MIL/uL (ref 3.87–5.11)
RDW: 12.6 % (ref 11.5–15.5)
WBC: 5.6 10*3/uL (ref 4.0–10.5)
nRBC: 0 % (ref 0.0–0.2)

## 2019-12-03 LAB — BASIC METABOLIC PANEL
Anion gap: 6 (ref 5–15)
BUN: 14 mg/dL (ref 8–23)
CO2: 27 mmol/L (ref 22–32)
Calcium: 9.5 mg/dL (ref 8.9–10.3)
Chloride: 108 mmol/L (ref 98–111)
Creatinine, Ser: 0.77 mg/dL (ref 0.44–1.00)
GFR calc Af Amer: >60 mL/min (ref >60)
GFR calc non Af Amer: >60 mL/min (ref >60)
Glucose, Bld: 91 mg/dL (ref 70–99)
Potassium: 3.9 mmol/L (ref 3.5–5.1)
Sodium: 141 mmol/L (ref 135–145)

## 2019-12-03 LAB — SARS CORONAVIRUS 2 (TAT 6-24 HRS): SARS Coronavirus 2: NEGATIVE

## 2019-12-03 NOTE — Progress Notes (Signed)
PCP - Dr. Karlton Lemon Cardiologist - pt denies   Chest x-ray - 10/31/19 EKG - 09/27/19 Stress Test - 09/26/19 ECHO - pt denies Cardiac Cath - pt denies   COVID TEST- 12/03/19 after PAT appt  Coronavirus Screening  Have you experienced the following symptoms:  Cough yes/no: No Fever (>100.64F)  yes/no: No Runny nose yes/no: No Sore throat yes/no: No Difficulty breathing/shortness of breath  yes/no: No  Have you or a family member traveled in the last 14 days and where? yes/no: No   If the patient indicates "YES" to the above questions, their PAT will be rescheduled to limit the exposure to others and, the surgeon will be notified. THE PATIENT WILL NEED TO BE ASYMPTOMATIC FOR 14 DAYS.   If the patient is not experiencing any of these symptoms, the PAT nurse will instruct them to NOT bring anyone with them to their appointment since they may have these symptoms or traveled as well.   Please remind your patients and families that hospital visitation restrictions are in effect and the importance of the restrictions.    Anesthesia review: yes, recent lobectomy  Pt denies any hx of facial trauma, never had broken nose. Per pt, both nares alternate with slight stuffiness (based on allergies). Pt reports that she gets nose bleeds in R nare. No recent bleeds L nare (L nare bleed "many many years ago"). Pt reports of R nare bleeding about 3-4x this month, some were heavier, pt would hold her nose until bleeding stops, never had to seek medical attention for nose bleeds.  Per pt, since procedure to feel slightly more short of breath upon exertion and coughing. Pt verbalized understanding to continue using her incentive spirometer to help cough and deep breathe. Pt reports aching at previous surgical site. Surgical site assessed, no redness, no increased pain, no swelling, no open wounds, pt afebrile, VSS. Site looks clean, dry, intact. Pt just complains that the weight of her L breast makes her  incision site ache after increased activity due to "feelings like my skin pulls and it just starts aching".   Patient denies chest pain at PAT appointment.   All instructions explained to the patient, with a verbal understanding of the material. Patient agrees to go over the instructions while at home for a better understanding. Patient also instructed to self quarantine after being tested for COVID-19. The opportunity to ask questions was provided.

## 2019-12-03 NOTE — Progress Notes (Signed)
Your procedure is scheduled on Thursday December 06, 2019.  Report to [facility] Main Entrance "A" at 05:30 A.M., and check in at the Admitting office.  Call this number if you have problems the morning of surgery: [phone]  Call [phone] if you have any questions prior to your surgery date Monday-Friday 8am-4pm   Remember: Do not eat or drink after midnight the night before your surgery  Take these medicines the morning of surgery with A SIP OF WATER: Acetylcysteine  pregabalin (LYRICA)   As of today, STOP taking any Aspirin (unless otherwise instructed by your surgeon), Aleve, Naproxen, Ibuprofen, Motrin, Advil, Goody's, BC's, all herbal medications, fish oil, and all vitamins.    The Morning of Surgery  Do not wear jewelry, make-up or nail polish.  Do not wear lotions, powders, perfumes, or deodorant  Do not shave 48 hours prior to surgery.    Do not bring valuables to the hospital.  [facility] is not responsible for any belongings or valuables.  If you are a smoker, DO NOT Smoke 24 hours prior to surgery  If you wear a CPAP at night please bring your mask the morning of surgery   Remember that you must have someone to transport you home after your surgery, and remain with you for 24 hours if you are discharged the same day.   Please bring cases for contacts, glasses, hearing aids, dentures or bridgework because it cannot be worn into surgery.    Leave your suitcase in the car.  After surgery it may be brought to your room.  For patients admitted to the hospital, discharge time will be determined by your treatment team.  Patients discharged the day of surgery will not be allowed to drive home.    Special instructions:   [facility]- Preparing For Surgery  Before surgery, you can play an important role. Because skin is not sterile, your skin needs to be as free of germs as possible. You can reduce the number of germs on your skin by washing with CHG  (chlorahexidine gluconate) Soap before surgery.  CHG is an antiseptic cleaner which kills germs and bonds with the skin to continue killing germs even after washing.    Oral Hygiene is also important to reduce your risk of infection.  Remember - BRUSH YOUR TEETH THE MORNING OF SURGERY WITH YOUR REGULAR TOOTHPASTE  Please do not use if you have an allergy to CHG or antibacterial soaps. If your skin becomes reddened/irritated stop using the CHG.  Do not shave (including legs and underarms) for at least 48 hours prior to first CHG shower. It is OK to shave your face.  Please follow these instructions carefully.   1. Shower the NIGHT BEFORE SURGERY and the MORNING OF SURGERY with CHG Soap.   2. If you chose to wash your hair and body, wash as usual with your normal shampoo and body-wash/soap.  3. Rinse your hair and body thoroughly to remove the shampoo and soap.  4. Apply CHG directly to the skin (ONLY FROM THE NECK DOWN) and wash gently with a scrungie or a clean washcloth.   5. Do not use on open wounds or open sores. Avoid contact with your eyes, ears, mouth and genitals (private parts). Wash Face and genitals (private parts)  with your normal soap.   6. Wash thoroughly, paying special attention to the area where your surgery will be performed.  7. Thoroughly rinse your body with warm water from the neck down.  8. DO NOT shower/wash with your normal soap after using and rinsing off the CHG Soap.  9. Pat yourself dry with a CLEAN TOWEL.  10. Wear CLEAN PAJAMAS to bed the night before surgery  11. Place CLEAN SHEETS on your bed the night of your first shower and DO NOT SLEEP WITH PETS.  12. Wear comfortable clothes the morning of surgery.     Day of Surgery:  Please shower the morning of surgery with the CHG soap Do not apply any deodorants/lotions. Please wear clean clothes to the hospital/surgery center.   Remember to brush your teeth WITH YOUR REGULAR TOOTHPASTE.   Please  read over the following fact sheets that you were given.

## 2019-12-04 ENCOUNTER — Ambulatory Visit: Payer: BC Managed Care – PPO | Admitting: Internal Medicine

## 2019-12-04 ENCOUNTER — Other Ambulatory Visit: Payer: BC Managed Care – PPO

## 2019-12-04 ENCOUNTER — Ambulatory Visit: Payer: BC Managed Care – PPO

## 2019-12-04 NOTE — Anesthesia Preprocedure Evaluation (Addendum)
Anesthesia Evaluation  Patient identified by MRN, date of birth, ID band Patient awake    Reviewed: Allergy & Precautions, NPO status , Patient's Chart, lab work & pertinent test results  History of Anesthesia Complications Negative for: history of anesthetic complications  Airway Mallampati: II  TM Distance: >3 FB Neck ROM: Full    Dental  (+) Poor Dentition, Chipped, Missing, Loose   Pulmonary former smoker,  Lung cancer s/p left lower lobectomy Jan 2021   Pulmonary exam normal        Cardiovascular negative cardio ROS Normal cardiovascular exam     Neuro/Psych negative neurological ROS  negative psych ROS   GI/Hepatic Neg liver ROS, GERD  ,  Endo/Other  negative endocrine ROS  Renal/GU negative Renal ROS  negative genitourinary   Musculoskeletal negative musculoskeletal ROS (+)   Abdominal   Peds  Hematology negative hematology ROS (+)   Anesthesia Other Findings  Nuclear stress test 09/26/19: hyperdynamic LV (EF 72%), low risk study, no evidence of ischemia or prior infarction  Reproductive/Obstetrics                          Anesthesia Physical Anesthesia Plan  ASA: III  Anesthesia Plan: General   Post-op Pain Management:    Induction: Intravenous  PONV Risk Score and Plan: 3 and Ondansetron, Dexamethasone, Treatment may vary due to age or medical condition and Midazolam  Airway Management Planned: Nasal ETT  Additional Equipment: None  Intra-op Plan:   Post-operative Plan: Extubation in OR  Informed Consent: I have reviewed the patients History and Physical, chart, labs and discussed the procedure including the risks, benefits and alternatives for the proposed anesthesia with the patient or authorized representative who has indicated his/her understanding and acceptance.     Dental advisory given  Plan Discussed with:   Anesthesia Plan Comments: (S/p recent left  robotic assisted thoracoscopy for left lower lobectomy and lymph node sampling 10/01/19. Doing well per followup with Dr. Kipp Brood 11/09/19. Now requires dental extractions prior to starting chemotherapy.   Regarding nasotracheal intubation, pt reports frequent nosebleeds from R nares, 3-4 times per month. Have never required anything more than home care. Has only had 1 nosebleed from L nares many years ago. She does have seasonal allergies with nasal congestion.   Preop labs reviewed, WNL.    EKG 09/27/19: Normal sinus rhythm. Rate 62. Minimal voltage criteria for LVH, may be normal variant  Nuclear stress 09/26/19: Nuclear stress EF: 72%. The left ventricular ejection fraction is hyperdynamic (>65%). There was no ST segment deviation noted during stress. Defect 1: There is a small defect of moderate severity present in the apical inferior, apical lateral and apex location. This is most c/w chest and diaphragmatic attenuation artifact. This is a low risk study. There is no evidence of ischemia or previous infarction. There is hyperdynamic left ventricular systolic function. The study is normal. )      Anesthesia Quick Evaluation

## 2019-12-04 NOTE — Progress Notes (Signed)
Anesthesia Chart Review:  S/p recent left robotic assisted thoracoscopy for left lower lobectomy and lymph node sampling 10/01/19. Doing well per followup with Dr. Kipp Brood 11/09/19. Now requires dental extractions prior to starting chemotherapy.   Regarding nasotracheal intubation, pt reports frequent nosebleeds from R nares, 3-4 times per month. Have never required anything more than home care. Has only had 1 nosebleed from L nares many years ago. She does have seasonal allergies with nasal congestion.   Preop labs reviewed, WNL.    EKG 09/27/19: Normal sinus rhythm. Rate 62. Minimal voltage criteria for LVH, may be normal variant  Nuclear stress 09/26/19:  Nuclear stress EF: 72%. The left ventricular ejection fraction is hyperdynamic (>65%).  There was no ST segment deviation noted during stress.  Defect 1: There is a small defect of moderate severity present in the apical inferior, apical lateral and apex location. This is most c/w chest and diaphragmatic attenuation artifact.  This is a low risk study. There is no evidence of ischemia or previous infarction. There is hyperdynamic left ventricular systolic function.  The study is normal.   Wynonia Musty [facility] Short Stay Center/Anesthesiology Phone [phone] 12/04/2019 4:01 PM

## 2019-12-06 ENCOUNTER — Encounter (HOSPITAL_COMMUNITY)
Admission: RE | Disposition: A | Discharge status: Home / Self Care | Payer: Self-pay | Source: Home / Self Care | Attending: Dentistry

## 2019-12-06 ENCOUNTER — Encounter (HOSPITAL_COMMUNITY): Payer: Self-pay | Admitting: Dentistry

## 2019-12-06 ENCOUNTER — Other Ambulatory Visit: Payer: Self-pay

## 2019-12-06 ENCOUNTER — Ambulatory Visit (HOSPITAL_COMMUNITY): Payer: BC Managed Care – PPO | Admitting: Anesthesiology

## 2019-12-06 ENCOUNTER — Ambulatory Visit (HOSPITAL_COMMUNITY): Payer: BC Managed Care – PPO | Admitting: Vascular Surgery

## 2019-12-06 ENCOUNTER — Ambulatory Visit (HOSPITAL_COMMUNITY)
Admission: RE | Admit: 2019-12-06 | Discharge: 2019-12-06 | Disposition: A | Discharge status: Home / Self Care | Payer: BC Managed Care – PPO | Attending: Dentistry | Admitting: Dentistry

## 2019-12-06 DIAGNOSIS — K045 Chronic apical periodontitis: Secondary | ICD-10-CM | POA: Insufficient documentation

## 2019-12-06 DIAGNOSIS — K5900 Constipation, unspecified: Secondary | ICD-10-CM | POA: Diagnosis not present

## 2019-12-06 DIAGNOSIS — Z886 Allergy status to analgesic agent status: Secondary | ICD-10-CM | POA: Diagnosis not present

## 2019-12-06 DIAGNOSIS — Z884 Allergy status to anesthetic agent status: Secondary | ICD-10-CM | POA: Diagnosis not present

## 2019-12-06 DIAGNOSIS — F40248 Other situational type phobia: Secondary | ICD-10-CM | POA: Insufficient documentation

## 2019-12-06 DIAGNOSIS — K219 Gastro-esophageal reflux disease without esophagitis: Secondary | ICD-10-CM | POA: Insufficient documentation

## 2019-12-06 DIAGNOSIS — M2607 Excessive tuberosity of jaw: Secondary | ICD-10-CM | POA: Insufficient documentation

## 2019-12-06 DIAGNOSIS — Z79899 Other long term (current) drug therapy: Secondary | ICD-10-CM | POA: Insufficient documentation

## 2019-12-06 DIAGNOSIS — Z902 Acquired absence of lung [part of]: Secondary | ICD-10-CM | POA: Diagnosis not present

## 2019-12-06 DIAGNOSIS — K083 Retained dental root: Secondary | ICD-10-CM | POA: Insufficient documentation

## 2019-12-06 DIAGNOSIS — C3492 Malignant neoplasm of unspecified part of left bronchus or lung: Secondary | ICD-10-CM | POA: Insufficient documentation

## 2019-12-06 DIAGNOSIS — F408 Other phobic anxiety disorders: Secondary | ICD-10-CM | POA: Diagnosis not present

## 2019-12-06 DIAGNOSIS — Z87891 Personal history of nicotine dependence: Secondary | ICD-10-CM | POA: Insufficient documentation

## 2019-12-06 DIAGNOSIS — K029 Dental caries, unspecified: Secondary | ICD-10-CM | POA: Insufficient documentation

## 2019-12-06 DIAGNOSIS — Z7952 Long term (current) use of systemic steroids: Secondary | ICD-10-CM | POA: Diagnosis not present

## 2019-12-06 DIAGNOSIS — Z90722 Acquired absence of ovaries, bilateral: Secondary | ICD-10-CM | POA: Insufficient documentation

## 2019-12-06 DIAGNOSIS — Z880 Allergy status to penicillin: Secondary | ICD-10-CM | POA: Diagnosis not present

## 2019-12-06 HISTORY — PX: MULTIPLE EXTRACTIONS WITH ALVEOLOPLASTY: SHX5342

## 2019-12-06 SURGERY — MULTIPLE EXTRACTION WITH ALVEOLOPLASTY
Anesthesia: General | Laterality: Bilateral

## 2019-12-06 MED ORDER — ONDANSETRON HCL 4 MG/2ML IJ SOLN: 4.0000 mg | Freq: Once | INTRAMUSCULAR | Status: DC | PRN

## 2019-12-06 MED ORDER — FENTANYL CITRATE (PF) 100 MCG/2ML IJ SOLN
INTRAMUSCULAR | Status: DC | PRN
  Administered 2019-12-06 (×2): 50 ug via INTRAVENOUS

## 2019-12-06 MED ORDER — OXYMETAZOLINE HCL 0.05 % NA SOLN
NASAL | Status: AC
  Filled 2019-12-06: qty 30

## 2019-12-06 MED ORDER — 0.9 % SODIUM CHLORIDE (POUR BTL) OPTIME
TOPICAL | Status: DC | PRN
  Administered 2019-12-06: 1000 mL

## 2019-12-06 MED ORDER — ROCURONIUM BROMIDE 10 MG/ML (PF) SYRINGE
PREFILLED_SYRINGE | INTRAVENOUS | Status: DC | PRN
  Administered 2019-12-06: 60 mg via INTRAVENOUS

## 2019-12-06 MED ORDER — PROPOFOL 10 MG/ML IV BOLUS
INTRAVENOUS | Status: AC
  Filled 2019-12-06: qty 20

## 2019-12-06 MED ORDER — FENTANYL CITRATE (PF) 100 MCG/2ML IJ SOLN
25.0000 ug | INTRAMUSCULAR | Status: DC | PRN
  Administered 2019-12-06 (×4): 25 ug via INTRAVENOUS

## 2019-12-06 MED ORDER — ONDANSETRON HCL 4 MG/2ML IJ SOLN
INTRAMUSCULAR | Status: DC | PRN
  Administered 2019-12-06: 4 mg via INTRAVENOUS

## 2019-12-06 MED ORDER — OXYCODONE HCL 5 MG/5ML PO SOLN: 5.0000 mg | Freq: Once | ORAL | Status: DC | PRN

## 2019-12-06 MED ORDER — MIDAZOLAM HCL 2 MG/2ML IJ SOLN
INTRAMUSCULAR | Status: AC
  Filled 2019-12-06: qty 2

## 2019-12-06 MED ORDER — FENTANYL CITRATE (PF) 100 MCG/2ML IJ SOLN
INTRAMUSCULAR | Status: AC
  Filled 2019-12-06: qty 2

## 2019-12-06 MED ORDER — LIDOCAINE-EPINEPHRINE 2 %-1:100000 IJ SOLN
INTRAMUSCULAR | Status: DC | PRN
  Administered 2019-12-06: 10.2 mL via INTRADERMAL

## 2019-12-06 MED ORDER — DEXAMETHASONE SODIUM PHOSPHATE 10 MG/ML IJ SOLN
INTRAMUSCULAR | Status: DC | PRN
  Administered 2019-12-06: 4 mg via INTRAVENOUS

## 2019-12-06 MED ORDER — LIDOCAINE 2% (20 MG/ML) 5 ML SYRINGE
INTRAMUSCULAR | Status: DC | PRN
  Administered 2019-12-06: 60 mg via INTRAVENOUS

## 2019-12-06 MED ORDER — LIDOCAINE-EPINEPHRINE 2 %-1:100000 IJ SOLN
INTRAMUSCULAR | Status: AC
  Filled 2019-12-06: qty 10.2

## 2019-12-06 MED ORDER — TRAMADOL HCL 50 MG PO TABS: 50.0000 mg | ORAL_TABLET | Freq: Four times a day (QID) | ORAL | 0 refills | Status: DC | PRN

## 2019-12-06 MED ORDER — MIDAZOLAM HCL 2 MG/2ML IJ SOLN
INTRAMUSCULAR | Status: DC | PRN
  Administered 2019-12-06: 2 mg via INTRAVENOUS

## 2019-12-06 MED ORDER — CLINDAMYCIN PHOSPHATE 600 MG/50ML IV SOLN
600.0000 mg | Freq: Once | INTRAVENOUS | Status: AC
  Administered 2019-12-06: 600 mg via INTRAVENOUS
  Filled 2019-12-06: qty 50

## 2019-12-06 MED ORDER — AMINOCAPROIC ACID SOLUTION 5% (50 MG/ML)
ORAL | Status: DC | PRN
  Administered 2019-12-06: 5 mL via ORAL

## 2019-12-06 MED ORDER — SUGAMMADEX SODIUM 200 MG/2ML IV SOLN
INTRAVENOUS | Status: DC | PRN
  Administered 2019-12-06: 200 mg via INTRAVENOUS

## 2019-12-06 MED ORDER — AMINOCAPROIC ACID SOLUTION 5% (50 MG/ML)
10.0000 mL | ORAL | Status: DC
  Filled 2019-12-06: qty 100

## 2019-12-06 MED ORDER — FENTANYL CITRATE (PF) 250 MCG/5ML IJ SOLN
INTRAMUSCULAR | Status: AC
  Filled 2019-12-06: qty 5

## 2019-12-06 MED ORDER — PROPOFOL 10 MG/ML IV BOLUS
INTRAVENOUS | Status: DC | PRN
  Administered 2019-12-06: 140 mg via INTRAVENOUS

## 2019-12-06 MED ORDER — THROMBIN 5000 UNITS EX SOLR
CUTANEOUS | Status: AC
  Filled 2019-12-06: qty 5000

## 2019-12-06 MED ORDER — BUPIVACAINE-EPINEPHRINE (PF) 0.5% -1:200000 IJ SOLN
INTRAMUSCULAR | Status: AC
  Filled 2019-12-06: qty 3.6

## 2019-12-06 MED ORDER — LACTATED RINGERS IV SOLN: INTRAVENOUS | Status: DC | PRN

## 2019-12-06 MED ORDER — OXYCODONE HCL 5 MG PO TABS: 5.0000 mg | ORAL_TABLET | Freq: Once | ORAL | Status: DC | PRN

## 2019-12-06 SURGICAL SUPPLY — 38 items
ALCOHOL 70% 16 OZ (MISCELLANEOUS) ×2 IMPLANT
ATTRACTOMAT 16X20 MAGNETIC DRP (DRAPES) ×2 IMPLANT
BANDAGE HEMOSTAT MRDH 4X4 STRL (MISCELLANEOUS) IMPLANT
BLADE SURG 15 STRL LF DISP TIS (BLADE) ×2 IMPLANT
BLADE SURG 15 STRL SS (BLADE) ×4
BNDG HEMOSTAT MRDH 4X4 STRL (MISCELLANEOUS)
COVER SURGICAL LIGHT HANDLE (MISCELLANEOUS) ×2 IMPLANT
GAUZE 4X4 16PLY RFD (DISPOSABLE) ×2 IMPLANT
GAUZE PACKING FOLDED 2  STR (GAUZE/BANDAGES/DRESSINGS) ×2
GAUZE PACKING FOLDED 2 STR (GAUZE/BANDAGES/DRESSINGS) ×1 IMPLANT
GLOVE BIO SURGEON STRL SZ 6.5 (GLOVE) ×2 IMPLANT
GLOVE SURG ORTHO 8.0 STRL STRW (GLOVE) ×2 IMPLANT
GOWN STRL REUS W/ TWL LRG LVL3 (GOWN DISPOSABLE) ×1 IMPLANT
GOWN STRL REUS W/TWL 2XL LVL3 (GOWN DISPOSABLE) ×2 IMPLANT
GOWN STRL REUS W/TWL LRG LVL3 (GOWN DISPOSABLE) ×2
KIT BASIN OR (CUSTOM PROCEDURE TRAY) ×2 IMPLANT
KIT TURNOVER KIT B (KITS) ×2 IMPLANT
MANIFOLD NEPTUNE II (INSTRUMENTS) ×2 IMPLANT
NDL BLUNT 16X1.5 OR ONLY (NEEDLE) IMPLANT
NDL DENTAL 27 LONG (NEEDLE) IMPLANT
NEEDLE BLUNT 16X1.5 OR ONLY (NEEDLE) IMPLANT
NEEDLE DENTAL 27 LONG (NEEDLE) IMPLANT
NS IRRIG 1000ML POUR BTL (IV SOLUTION) ×2 IMPLANT
PACK EENT II TURBAN DRAPE (CUSTOM PROCEDURE TRAY) ×2 IMPLANT
PAD ARMBOARD 7.5X6 YLW CONV (MISCELLANEOUS) ×2 IMPLANT
SPONGE SURGIFOAM ABS GEL 100 (HEMOSTASIS) IMPLANT
SPONGE SURGIFOAM ABS GEL 12-7 (HEMOSTASIS) IMPLANT
SPONGE SURGIFOAM ABS GEL SZ50 (HEMOSTASIS) IMPLANT
SUCTION FRAZIER HANDLE 10FR (MISCELLANEOUS) ×2
SUCTION TUBE FRAZIER 10FR DISP (MISCELLANEOUS) ×1 IMPLANT
SUT CHROMIC 3 0 PS 2 (SUTURE) ×4 IMPLANT
SUT CHROMIC 4 0 P 3 18 (SUTURE) IMPLANT
SYR 50ML SLIP (SYRINGE) ×2 IMPLANT
TOWEL GREEN STERILE (TOWEL DISPOSABLE) ×2 IMPLANT
TUBE CONNECTING 12X1/4 (SUCTIONS) ×2 IMPLANT
WATER STERILE IRR 1000ML POUR (IV SOLUTION) ×2 IMPLANT
WATER TABLETS ICX (MISCELLANEOUS) ×2 IMPLANT
YANKAUER SUCT BULB TIP NO VENT (SUCTIONS) ×2 IMPLANT

## 2019-12-06 NOTE — Anesthesia Procedure Notes (Signed)
Procedure Name: Intubation Date/Time: 12/06/2019 7:52 AM Performed by: Leonor Liv, CRNA Pre-anesthesia Checklist: Patient identified, Emergency Drugs available, Suction available and Patient being monitored Patient Re-evaluated:Patient Re-evaluated prior to induction Oxygen Delivery Method: Circle System Utilized Preoxygenation: Pre-oxygenation with 100% oxygen Induction Type: IV induction Ventilation: Mask ventilation without difficulty Nasal Tubes: Nasal Rae, Nasal prep performed, Left and Magill forceps - small, utilized Tube size: 7.0 mm Number of attempts: 1 Airway Equipment and Method: Stylet and Oral airway Placement Confirmation: ETT inserted through vocal cords under direct vision,  positive ETCO2 and breath sounds checked- equal and bilateral Secured at: 28 cm Tube secured with: Tape Dental Injury: Teeth and Oropharynx as per pre-operative assessment

## 2019-12-06 NOTE — Op Note (Signed)
OPERATIVE REPORT  Patient:            Sarah Lee Date of Birth:  1951/08/23 MRN:                [id number]   DATE OF PROCEDURE:  12/06/2019  PREOPERATIVE DIAGNOSES: 1.  Adenosquamous carcinoma of the left lung 2.  Prechemotherapy dental protocol 3.  Chronic apical periodontitis 4.  Retained root segments 5.  Dental caries 6.  Bilateral maxillary excessive tuberosities 7.  Dental phobia  POSTOPERATIVE DIAGNOSES: 1.  Adenosquamous carcinoma of the left lung 2.  Prechemotherapy dental protocol 3.  Chronic apical periodontitis 4.  Retained root segments 5.  Dental caries 6.  Bilateral maxillary excessive tuberosities 7.  Dental phobia  OPERATIONS: 1. Multiple extraction of tooth numbers 4, and 7-13 2. 2 Quadrants of alveoloplasty 3.  Bilateral maxillary tuberosity reductions   SURGEON: Lenn Cal, DDS  ASSISTANT: Molli Posey (dental assistant)  ANESTHESIA: General anesthesia via nasoendotracheal tube.  MEDICATIONS: 1.  Clindamycin 600 mg IV prior to invasive dental procedures 2. Local anesthesia with a total utilization of 6 carpules each containing 34 mg of lidocaine with 0.017 mg of epinephrine.  SPECIMENS: There are 8 teeth that were discarded.  DRAINS: None  CULTURES: None  COMPLICATIONS: None  ESTIMATED BLOOD LOSS: 150 mLs.  INTRAVENOUS FLUIDS: 500 mLs of Lactated ringers solution.  INDICATIONS: The patient was recently diagnosed with adenosquamous carcinoma of the left lung.  A medically necessary dental consultation was then requested to evaluate poor dentition prior to chemotherapy.  The patient was examined and treatment planned for multiple extractions with alveoloplasty and bilateral maxillary tuberosity reductions.  This treatment plan was formulated to decrease the risks and complications associated with dental infection from affecting the patient's systemic health during chemotherapy and to assist in prosthodontic rehabilitation for  the patient.  OPERATIVE FINDINGS: Patient was examined operating room number 6.  The teeth were identified for extraction. The patient was noted to be affected by chronic apical periodontitis, multiple retained root segments, dental caries, and bilateral maxillary excessive tuberosities.   DESCRIPTION OF PROCEDURE: Patient was brought to the main operating room number 6. Patient was then placed in the supine position on the operating table. General anesthesia was then induced per the anesthesia team. The patient was then prepped and draped in the usual manner for dental medicine procedure. A timeout was performed. The patient was identified and procedures were verified. A throat pack was placed at this time. The oral cavity was then thoroughly examined with the findings noted above. The patient was then ready for dental medicine procedure as follows:  Local anesthesia was then administered sequentially with a total utilization of 6 carpules each containing 34 mg of lidocaine with 0.017 mg.  The Maxillary left and right quadrants first approached. Anesthesia was then delivered utilizing infiltration with lidocaine with epinephrine. A #15 blade incision was then made from the maxillary right tuberosity and extended to the maxillary left tuberosity.  A  surgical flap was then carefully reflected.  The maxillary teeth were then subluxated with a series of straight elevators.  Tooth numbers 4, and 7 through 13 were then removed with a 150 forceps without complications.  Alveoloplasty was then performed utilizing rongeurs and bone file to help achieve primary closure.  The tissues were approximated and trimmed appropriately.  The bilateral maxillary excessive tuberosities were then approached.  A series of 15 blade incisions were made to remove the redundant tissue.  Additional soft  tissue was removed as needed to complete the fibrous tuberosity reductions.  The tissues were approximated and trimmed  appropriately.  The surgical sites were then irrigated with copious amounts of sterile saline x4.  The maxillary right surgical site was then closed from the maxillary right tuberosity and extended to the mesial #8 utilizing 3-0 Chromic Gut suture in a continuous erupted suture technique x1.  The maxillary left surgical site was then closed from the maxillary left tuberosity and extended the mesial #9 utilizing 3-0 Chromic Gut suture in a continuous erupted suture technique x1.    At this point in time, the entire mouth was irrigated with copious amounts of sterile saline. The patient was examined for complications, seeing none, the dental medicine procedure was deemed to be complete. The throat pack was removed at this time. An oral airway was then placed at the request of the anesthesia team. A series of 4 x 4 gauze moistened with Amicar 5% rinse were placed in the mouth to aid hemostasis. The patient was then handed over to the anesthesia team for final disposition. After an appropriate amount of time, the patient was extubated and taken to the postanesthsia care unit in good condition. All counts were correct for the dental medicine procedure.  The patient is to continue the Amicar 5% rinses postoperatively.  Patient is to rinse with 10 mils every hour for the next 10 hours in a swish and spit manner.  Patient is to utilize tramadol 50 mg every 6 hours as needed for dental pain postoperatively.  Patient is to return to Dental Medicine for evaluation of healing and suture removal in 7 to 10 days.  Patient is to start chemotherapy at the discretion of Dr. Earlie Server.   Lenn Cal, DDS.

## 2019-12-06 NOTE — Anesthesia Postprocedure Evaluation (Signed)
Anesthesia Post Note  Patient: Sarah Lee  Procedure(s) Performed: Extraction of tooth #'s 4, and 7-13 with alveoloplasty  and bilateral maxillary  tuberosity reductions (Bilateral )     Patient location during evaluation: PACU Anesthesia Type: General Level of consciousness: awake and alert Pain management: pain level controlled Vital Signs Assessment: post-procedure vital signs reviewed and stable Respiratory status: spontaneous breathing, nonlabored ventilation and respiratory function stable Cardiovascular status: blood pressure returned to baseline and stable Postop Assessment: no apparent nausea or vomiting Anesthetic complications: no    Last Vitals:  Vitals:   12/06/19 1017 12/06/19 1018  BP:  129/72  Pulse: 65 65  Resp: 14 13  Temp:  (!) 36.2 C  SpO2: 95% 95%    Last Pain:  Vitals:   12/06/19 1015  TempSrc:   PainSc: 6                  Lidia Collum

## 2019-12-06 NOTE — Discharge Instructions (Signed)

## 2019-12-06 NOTE — H&P (Signed)
12/06/2019  Patient:            Sarah Lee Date of Birth:  11/28/1950 MRN:                [id number]   BP 109/70   Pulse 64   Temp 98.2 F (36.8 C) (Oral)   Resp 17   Ht 5' 4.5" (1.638 m)   Wt 95.7 kg   LMP 01/17/1994   SpO2 99%   BMI 35.66 kg/m    Sarah Lee is a 69 year old female with lung cancer.  Patient with anticipated chemotherapy.  Patient was seen as part of a medically necessary prechemotherapy dental protocol examination.  Patient now presents for multiple dental extractions with alveoloplasty and preprosthetic surgery as indicated in the operating room with general anesthesia.  Patient denies any acute medical or dental changes.  Please see note from Dr. Julien Nordmann on 11/14/2019 act as H&P for the dental operating procedure.  Lenn Cal, DDS    OFFICE PROGRESS NOTE     [address], [place] [address]     DIAGNOSIS: stage IIa (T2a, N0, M0) non-small cell lung cancer, adenosquamous carcinoma presented with left lower lobe lung mass      Her molecular studies were negative for EGFR mutation.     PRIOR THERAPY: Status post left lower lobectomy with lymph node sampling on October 01, 2019 under the care of Dr. Kipp Brood.     CURRENT THERAPY: Adjuvant systemic chemotherapy with cisplatin 75 mg/M2 and Alimta 500 mg/M2 every 3 weeks.  First dose expected December 11, 2019.     INTERVAL HISTORY:  Sarah Lee 69 y.o. female returns to the clinic today for follow-up visit.  The patient is feeling fine today with no concerning complaints.  She was supposed to start the first cycle of her adjuvant systemic chemotherapy yesterday but she has a lot of dental issues and gum infection that need management and intervention before she proceed with her chemotherapy.  She denied having any current chest pain, shortness of breath, cough or hemoptysis.  She denied having any nausea, vomiting, diarrhea but has intermittent  constipation.  She has no fever or chills.  She has no headache or visual changes.  She is here today for reevaluation and discussion of her treatment plan.     MEDICAL HISTORY:       Past Medical History:    Diagnosis   Date    .   Allergy        .   Anemia        .   Arthritis        .   Asthma            as a child    .   Blood transfusion        .   Cancer ([place])            lung cancer    .   Cataract            slight    .   Complication of anesthesia            used sodium pentothal and very hard to wake up and arm swelled up from medication    .   Constipation        .   Gallstones        .   GERD (gastroesophageal reflux disease)        .  Heart murmur            no issues    .   Pneumonia        .   Seasonal allergies              ALLERGIES:  is allergic to advil [ibuprofen]; iron; penicillins; and tetracyclines & related.     MEDICATIONS:          Current Outpatient Medications    Medication   Sig   Dispense   Refill    .   Acetylcysteine (N-ACETYL-L-CYSTEINE PO)   Take 1 tablet by mouth daily.             .   Aromatic Inhalants (VICKS VAPOR IN)   Place 1 spray into the nose as needed (congestion.).             Marland Kitchen   BLACK ELDERBERRY PO   Take 2 capsules by mouth daily.            .   Cholecalciferol (VITAMIN D-3) 125 MCG (5000 UT) TABS   Take 10,000 Units by mouth daily.            Marland Kitchen   dexamethasone (DECADRON) 4 MG tablet   1 tablet p.o. twice daily the day before, day of and day after chemotherapy every 3 weeks   30 tablet   0    .   folic acid (FOLVITE) 1 MG tablet   Take 1 tablet (1 mg total) by mouth daily.   30 tablet   4    .   Magnesium 200 MG TABS   Take 200 mg by mouth daily.             Marland Kitchen   OVER THE COUNTER MEDICATION   Take 1 capsule by mouth daily. Flamex  Rx            .   pregabalin (LYRICA) 25 MG capsule   Take 1 capsule (25 mg total) by mouth 2 (two) times daily.   30 capsule   0    .   prochlorperazine (COMPAZINE) 10 MG tablet   Take 1 tablet (10 mg total) by mouth every 6 (six) hours as needed for nausea or vomiting.   30 tablet   0    .   Zinc 30 MG TABS   Take 30 mg by mouth daily.                No current facility-administered medications for this visit.          SURGICAL HISTORY:         Past Surgical History:    Procedure   Laterality   Date    .   ABDOMINAL HYSTERECTOMY       2004        fibroids--- TAH/BSO    .   BREAST SURGERY                right breast biopsy-benign    .   CESAREAN SECTION            .   CHOLECYSTECTOMY   N/A   10/28/2016        Procedure: LAPAROSCOPIC CHOLECYSTECTOMY WITH INTRAOPERATIVE CHOLANGIOGRAM;  Surgeon: Jackolyn Confer, MD;  Location: [facility];  Service: General;  Laterality: N/A;    .   COLONOSCOPY            .   ERCP   N/A  10/29/2016        Procedure: ENDOSCOPIC RETROGRADE CHOLANGIOPANCREATOGRAPHY (ERCP);  Surgeon: Doran Stabler, MD;  Location: [facility];  Service: Endoscopy;  Laterality: N/A;    .   NODE DISSECTION   Left   10/01/2019        Procedure: Node Dissection;  Surgeon: [name], MD;  Location: [facility];  Service: Thoracic;  Laterality: Left;    .   POLYPECTOMY            .   UTERINE FIBROID SURGERY            .   VIDEO BRONCHOSCOPY   N/A   10/01/2019        Procedure: VIDEO BRONCHOSCOPY;  Surgeon: [name], MD;  Location: [facility];  Service: Thoracic;  Laterality: N/A;          REVIEW OF SYSTEMS:  A comprehensive review of systems was negative.      PHYSICAL EXAMINATION: General appearance: alert, cooperative and no distress  Head: Normocephalic, without obvious abnormality, atraumatic  Neck: no  adenopathy, no JVD, supple, symmetrical, trachea midline and thyroid not enlarged, symmetric, no tenderness/mass/nodules  Lymph nodes: Cervical, supraclavicular, and axillary nodes normal.  Resp: clear to auscultation bilaterally  Back: symmetric, no curvature. ROM normal. No CVA tenderness.  Cardio: regular rate and rhythm, S1, S2 normal, no murmur, click, rub or gallop  GI: soft, non-tender; bowel sounds normal; no masses,  no organomegaly  Extremities: extremities normal, atraumatic, no cyanosis or edema     ECOG PERFORMANCE STATUS: 1 - Symptomatic but completely ambulatory     Blood pressure 133/72, pulse 78, temperature 98 F (36.7 C), temperature source Oral, resp. rate 18, height 5' 4.5" (1.638 m), weight 213 lb 14.4 oz (97 kg), last menstrual period 01/17/1994, SpO2 99 %.     LABORATORY DATA:  Recent Labs                                                                                                    Chemistry        Labs (Brief)                                                                                                            Labs (Brief)  RADIOGRAPHIC STUDIES:    Imaging Results                    ASSESSMENT AND PLAN: This is a very pleasant 69 years old African-American female recently diagnosed with a stage IIa non-small cell lung cancer, adenosquamous carcinoma status post left lower lobectomy with lymph node dissection on October 01, 2019 under the care of Dr. Kipp Brood.  The patient was supposed to start adjuvant systemic chemotherapy with cisplatin and Alimta yesterday but she has a lot of dental issues including gum infection and  she will need significant intervention with teeth extractions.  She was advised by her dentist not to proceed with chemotherapy at this point until he takes care of her dental issues.  I explained to the patient that they can delay her chemo by 4 more weeks until completion of the dental work.  She understands that if she has any further delay in her adjuvant systemic chemotherapy will not be able to do it because she will be beyond the 12 weeks period of initiation of adjuvant systemic chemotherapy.  I also gave her the option of continuous observation and close monitoring with no adjuvant treatment.  The patient agreed to the current plan and she will come back for follow-up visit in 4 weeks for evaluation before initiation of her treatment.  She was advised to call immediately if she has any other concerning symptoms in the interval.  The patient voices understanding of current disease status and treatment options and is in agreement with the current care plan.     All questions were answered. The patient knows to call the clinic with any problems, questions or concerns. We can certainly see the patient much sooner if necessary.   Disclaimer: This note was dictated with voice recognition software. Similar sounding words can inadvertently be transcribed and may not be corrected upon review.                     Electronically signed by Curt Bears, MD at 11/14/2019 11:30 AM

## 2019-12-06 NOTE — Transfer of Care (Signed)
Immediate Anesthesia Transfer of Care Note  Patient: Sarah Lee  Procedure(s) Performed: Extraction of tooth #'s 4, and 7-13 with alveoloplasty  and bilateral maxillary  tuberosity reductions (Bilateral )  Patient Location: PACU  Anesthesia Type:General  Level of Consciousness: awake, alert  and oriented  Airway & Oxygen Therapy: Patient Spontanous Breathing and Patient connected to face mask oxygen  Post-op Assessment: Report given to RN, Post -op Vital signs reviewed and stable and Patient moving all extremities X 4  Post vital signs: Reviewed and stable  Last Vitals:  Vitals Value Taken Time  BP 153/85 12/06/19 0918  Temp    Pulse 88 12/06/19 0923  Resp 15 12/06/19 0923  SpO2 100 % 12/06/19 0923  Vitals shown include unvalidated device data.  Last Pain:  Vitals:   12/06/19 0623  TempSrc:   PainSc: 0-No pain         Complications: No apparent anesthesia complications

## 2019-12-06 NOTE — Progress Notes (Signed)
PRE-OPERATIVE NOTE:  12/06/2019 Sarah Lee [id number]  VITALS: BP 109/70   Pulse 64   Temp 98.2 F (36.8 C) (Oral)   Resp 17   Ht 5' 4.5" (1.638 m)   Wt 95.7 kg   LMP 01/17/1994   SpO2 99%   BMI 35.66 kg/m   Lab Results  Component Value Date   WBC 5.6 12/03/2019   HGB 13.0 12/03/2019   HCT 40.8 12/03/2019   MCV 92.9 12/03/2019   PLT 193 12/03/2019   BMET    Component Value Date/Time   NA 141 12/03/2019 1016   K 3.9 12/03/2019 1016   CL 108 12/03/2019 1016   CO2 27 12/03/2019 1016   GLUCOSE 91 12/03/2019 1016   BUN 14 12/03/2019 1016   CREATININE 0.77 12/03/2019 1016   CREATININE 0.84 10/23/2019 1430   CALCIUM 9.5 12/03/2019 1016   GFRNONAA >60 12/03/2019 1016   GFRNONAA >60 10/23/2019 1430   GFRAA >60 12/03/2019 1016   GFRAA >60 10/23/2019 1430    Lab Results  Component Value Date   INR 1.0 09/27/2019   INR 0.9 09/03/2019   No results found for: PTT   Sarah Lee presents for multiple dental extractions with alveoloplasty and preprosthetic surgery as needed in the operating room and general anesthesia.     SUBJECTIVE: The patient denies any acute medical or dental changes and agrees to proceed with treatment as planned.  EXAM: No sign of acute dental changes.  ASSESSMENT: Patient is affected by chronic apical periodontitis, retained root segments, dental caries, and bilateral excessive maxillary tuberosities.  PLAN: Patient agrees to proceed with treatment as planned in the operating room as previously discussed and accepts the risks, benefits, and complications of the proposed treatment. Patient is aware of the risk for bleeding, bruising, swelling, infection, pain, nerve damage, soft tissue damage, damage to adjacent teeth, sinus involvement, root tip fracture, mandible fracture, and the risks of complications associated with the anesthesia. Patient also is aware of the potential for other complications not mentioned above.   Lenn Cal, DDS

## 2019-12-10 ENCOUNTER — Telehealth: Payer: Self-pay | Admitting: *Deleted

## 2019-12-10 NOTE — Telephone Encounter (Signed)
Dr. Julien Nordmann states he will evaluate patient tomorrow regarding chemo.  I called patient and updated her.

## 2019-12-10 NOTE — Telephone Encounter (Signed)
Oncology Nurse Navigator Documentation  Oncology Nurse Navigator Flowsheets 12/10/2019  Abnormal Finding Date -  Confirmed Diagnosis Date -  Diagnosis Status -  Planned Course of Treatment -  Phase of Treatment -  Chemotherapy Actual Start Date: -  Navigator Follow Up Date: -  Navigator Follow Up Reason: -  Navigator Location [facility]  Navigator Encounter Type Telephone/patient called. She has question is she should get her treatment on 3/30 due to her surgery. I contacted  Dr. Julien Nordmann for an update.   Telephone Incoming Call  Patient Visit Type -  Treatment Phase Pre-Tx/Tx Discussion  Barriers/Navigation Needs Coordination of Care;Education  Education Other  Interventions Coordination of Care;Education  Acuity Level 2-Minimal Needs (1-2 Barriers Identified)  Coordination of Care Other  Education Method Verbal  Time Spent with Patient 30

## 2019-12-11 ENCOUNTER — Other Ambulatory Visit: Payer: Self-pay

## 2019-12-11 ENCOUNTER — Telehealth: Payer: Self-pay | Admitting: Internal Medicine

## 2019-12-11 ENCOUNTER — Inpatient Hospital Stay: Payer: BC Managed Care – PPO

## 2019-12-11 ENCOUNTER — Encounter: Payer: Self-pay | Admitting: Internal Medicine

## 2019-12-11 ENCOUNTER — Inpatient Hospital Stay (HOSPITAL_BASED_OUTPATIENT_CLINIC_OR_DEPARTMENT_OTHER): Payer: BC Managed Care – PPO | Admitting: Internal Medicine

## 2019-12-11 VITALS — BP 138/72 | HR 95 | Temp 98.0°F | Resp 18 | Ht 64.5 in | Wt 210.9 lb

## 2019-12-11 DIAGNOSIS — C3432 Malignant neoplasm of lower lobe, left bronchus or lung: Secondary | ICD-10-CM | POA: Diagnosis not present

## 2019-12-11 DIAGNOSIS — C3492 Malignant neoplasm of unspecified part of left bronchus or lung: Secondary | ICD-10-CM | POA: Diagnosis not present

## 2019-12-11 DIAGNOSIS — Z5111 Encounter for antineoplastic chemotherapy: Secondary | ICD-10-CM

## 2019-12-11 LAB — CBC WITH DIFFERENTIAL (CANCER CENTER ONLY)
Abs Immature Granulocytes: 0.01 10*3/uL (ref 0.00–0.07)
Basophils Absolute: 0 10*3/uL (ref 0.0–0.1)
Basophils Relative: 0 %
Eosinophils Absolute: 0 10*3/uL (ref 0.0–0.5)
Eosinophils Relative: 0 %
HCT: 42.2 % (ref 36.0–46.0)
Hemoglobin: 13.7 g/dL (ref 12.0–15.0)
Immature Granulocytes: 0 %
Lymphocytes Relative: 13 %
Lymphs Abs: 0.8 10*3/uL (ref 0.7–4.0)
MCH: 29 pg (ref 26.0–34.0)
MCHC: 32.5 g/dL (ref 30.0–36.0)
MCV: 89.2 fL (ref 80.0–100.0)
Monocytes Absolute: 0.3 10*3/uL (ref 0.1–1.0)
Monocytes Relative: 5 %
Neutro Abs: 4.9 10*3/uL (ref 1.7–7.7)
Neutrophils Relative %: 82 %
Platelet Count: 259 10*3/uL (ref 150–400)
RBC: 4.73 MIL/uL (ref 3.87–5.11)
RDW: 12.1 % (ref 11.5–15.5)
WBC Count: 5.9 10*3/uL (ref 4.0–10.5)
nRBC: 0 % (ref 0.0–0.2)

## 2019-12-11 LAB — CMP (CANCER CENTER ONLY)
ALT: 20 U/L (ref 0–44)
AST: 21 U/L (ref 15–41)
Albumin: 3.8 g/dL (ref 3.5–5.0)
Alkaline Phosphatase: 82 U/L (ref 38–126)
Anion gap: 11 (ref 5–15)
BUN: 10 mg/dL (ref 8–23)
CO2: 22 mmol/L (ref 22–32)
Calcium: 10.1 mg/dL (ref 8.9–10.3)
Chloride: 108 mmol/L (ref 98–111)
Creatinine: 0.76 mg/dL (ref 0.44–1.00)
GFR, Est AFR Am: >60 mL/min (ref >60)
GFR, Estimated: >60 mL/min (ref >60)
Glucose, Bld: 111 mg/dL — ABNORMAL HIGH (ref 70–99)
Potassium: 4.3 mmol/L (ref 3.5–5.1)
Sodium: 141 mmol/L (ref 135–145)
Total Bilirubin: 0.4 mg/dL (ref 0.3–1.2)
Total Protein: 7.5 g/dL (ref 6.5–8.1)

## 2019-12-11 LAB — MAGNESIUM: Magnesium: 2.1 mg/dL (ref 1.7–2.4)

## 2019-12-11 NOTE — Telephone Encounter (Signed)
Scheduled per los. Gave avs and calendar  

## 2019-12-11 NOTE — Progress Notes (Signed)
[facility] [phone]   [phone]  OFFICE PROGRESS NOTE  [address], [place] [address]  DIAGNOSIS: Stage IIA (T2a, N0, M0) non-small cell lung cancer, adenosquamous carcinoma presented with left lower lobe lung mass   Her molecular studies were negative for EGFR mutation.  PRIOR THERAPY: Status post left lower lobectomy with lymph node sampling on October 01, 2019 under the care of Dr. Kipp Brood.  CURRENT THERAPY: Adjuvant systemic chemotherapy with cisplatin 75 mg/M2 and Alimta 500 mg/M2 every 3 weeks.  First dose expected December 11, 2019.  INTERVAL HISTORY: Sarah Lee 69 y.o. female returns to the clinic today for follow-up visit.  The patient is feeling much better today.  She had a lot of dental work done recently by Dr. Enrique Sack.  She still have some gum pain.  She is not interested in starting her chemotherapy today.  She denied having any current chest pain, shortness of breath, cough or hemoptysis.  She denied having any fever or chills.  She has no nausea, vomiting, diarrhea or constipation.  She has no headache or visual changes.  She is here today for evaluation and discussion of her treatment options.  MEDICAL HISTORY: Past Medical History:  Diagnosis Date  . Allergy   . Anemia   . Arthritis   . Asthma    as a child  . Blood transfusion   . Cancer ([place])    lung cancer  . Cataract    slight  . Complication of anesthesia    used sodium pentothal and very hard to wake up and arm swelled up from medication  . Constipation   . Gallstones   . GERD (gastroesophageal reflux disease)   . Heart murmur    no issues  . Pneumonia   . Seasonal allergies     ALLERGIES:  is allergic to advil [ibuprofen]; iron; penicillins; and tetracyclines & related.  MEDICATIONS:  Current Outpatient Medications  Medication Sig Dispense Refill  . Acetylcysteine (N-ACETYL-L-CYSTEINE PO) Take 1 tablet by  mouth daily.     . Aromatic Inhalants (VICKS VAPOR IN) Place 1 spray into the nose as needed (congestion.).     Marland Kitchen BLACK ELDERBERRY PO Take 2 capsules by mouth daily.    . Cholecalciferol (VITAMIN D-3) 125 MCG (5000 UT) TABS Take 10,000 Units by mouth daily.    Marland Kitchen dexamethasone (DECADRON) 4 MG tablet 1 tablet p.o. twice daily the day before, day of and day after chemotherapy every 3 weeks 30 tablet 0  . folic acid (FOLVITE) 1 MG tablet Take 1 tablet (1 mg total) by mouth daily. 30 tablet 4  . Magnesium 200 MG TABS Take 200 mg by mouth daily.     Marland Kitchen OVER THE COUNTER MEDICATION Take 1 capsule by mouth daily. Flamex Rx    . OVER THE COUNTER MEDICATION Take 1 capsule by mouth daily. Ultra Flora Probiotic    . pregabalin (LYRICA) 25 MG capsule Take 1 capsule (25 mg total) by mouth 2 (two) times daily. (Patient taking differently: Take 25 mg by mouth 2 (two) times daily as needed (pain). ) 60 capsule 0  . prochlorperazine (COMPAZINE) 10 MG tablet Take 1 tablet (10 mg total) by mouth every 6 (six) hours as needed for nausea or vomiting. 30 tablet 0  . traMADol (ULTRAM) 50 MG tablet Take 1 tablet (50 mg total) by mouth every 6 (six) hours as needed. 20 tablet 0  . Zinc  30 MG TABS Take 30 mg by mouth daily.     No current facility-administered medications for this visit.    SURGICAL HISTORY:  Past Surgical History:  Procedure Laterality Date  . ABDOMINAL HYSTERECTOMY  2004   fibroids--- TAH/BSO  . BREAST SURGERY     right breast biopsy-benign  . CESAREAN SECTION    . CHOLECYSTECTOMY N/A 10/28/2016   Procedure: LAPAROSCOPIC CHOLECYSTECTOMY WITH INTRAOPERATIVE CHOLANGIOGRAM;  Surgeon: Jackolyn Confer, MD;  Location: [facility];  Service: General;  Laterality: N/A;  . COLONOSCOPY    . ERCP N/A 10/29/2016   Procedure: ENDOSCOPIC RETROGRADE CHOLANGIOPANCREATOGRAPHY (ERCP);  Surgeon: Doran Stabler, MD;  Location: [facility];  Service: Endoscopy;  Laterality: N/A;  . MULTIPLE EXTRACTIONS WITH  ALVEOLOPLASTY Bilateral 12/06/2019   Procedure: Extraction of tooth #'s 4, and 7-13 with alveoloplasty  and bilateral maxillary  tuberosity reductions;  Surgeon: Lenn Cal, DDS;  Location: [facility];  Service: Oral Surgery;  Laterality: Bilateral;  . NODE DISSECTION Left 10/01/2019   Procedure: Node Dissection;  Surgeon: [name], MD;  Location: [facility];  Service: Thoracic;  Laterality: Left;  . POLYPECTOMY    . UTERINE FIBROID SURGERY    . VIDEO BRONCHOSCOPY N/A 10/01/2019   Procedure: VIDEO BRONCHOSCOPY;  Surgeon: [name], MD;  [facility];  Service: Thoracic;  Laterality: N/A;    REVIEW OF SYSTEMS:  A comprehensive review of systems was negative except for: Constitutional: positive for fatigue Ears, nose, mouth, throat, and face: positive for Toothache   PHYSICAL EXAMINATION: General appearance: alert, cooperative and no distress Head: Normocephalic, without obvious abnormality, atraumatic Neck: no adenopathy, no JVD, supple, symmetrical, trachea midline and thyroid not enlarged, symmetric, no tenderness/mass/nodules Lymph nodes: Cervical, supraclavicular, and axillary nodes normal. Resp: clear to auscultation bilaterally Back: symmetric, no curvature. ROM normal. No CVA tenderness. Cardio: regular rate and rhythm, S1, S2 normal, no murmur, click, rub or gallop GI: soft, non-tender; bowel sounds normal; no masses,  no organomegaly Extremities: extremities normal, atraumatic, no cyanosis or edema  ECOG PERFORMANCE STATUS: 1 - Symptomatic but completely ambulatory  Blood pressure 138/72, pulse 95, temperature 98 F (36.7 C), temperature source Oral, resp. rate 18, height 5' 4.5" (1.638 m), weight 210 lb 14.4 oz (95.7 kg), last menstrual period 01/17/1994, SpO2 98 %.  LABORATORY DATA: Lab Results  Component Value Date   WBC 5.6 12/03/2019   HGB 13.0 12/03/2019   HCT 40.8 12/03/2019   MCV 92.9 12/03/2019   PLT 193 12/03/2019      Chemistry        Component Value Date/Time   NA 141 12/03/2019 1016   K 3.9 12/03/2019 1016   CL 108 12/03/2019 1016   CO2 27 12/03/2019 1016   BUN 14 12/03/2019 1016   CREATININE 0.77 12/03/2019 1016   CREATININE 0.84 10/23/2019 1430      Component Value Date/Time   CALCIUM 9.5 12/03/2019 1016   ALKPHOS 86 10/23/2019 1430   AST 20 10/23/2019 1430   ALT 19 10/23/2019 1430   BILITOT 0.4 10/23/2019 1430       RADIOGRAPHIC STUDIES: No results found.  ASSESSMENT AND PLAN: This is a very pleasant 69 years old African-American female recently diagnosed with a stage IIa non-small cell lung cancer, adenosquamous carcinoma status post left lower lobectomy with lymph node dissection on October 01, 2019 under the care of Dr. Kipp Brood. We discussed adjuvant systemic chemotherapy with the patient but she keep delaying her treatment start.  She is  still complaining of some dental issues and she would not like to start her treatment today. I will rearrange for her treatment to start next week but I would not be able to provide her with any further delay and we may have to cancel her adjuvant treatment if not started by next week. For the dental issues she will continue her follow-up with the dentist. She was advised to call immediately if she has any concerning symptoms in the interval. The patient voices understanding of current disease status and treatment options and is in agreement with the current care plan.  All questions were answered. The patient knows to call the clinic with any problems, questions or concerns. We can certainly see the patient much sooner if necessary.  Disclaimer: This note was dictated with voice recognition software. Similar sounding words can inadvertently be transcribed and may not be corrected upon review.

## 2019-12-12 NOTE — Progress Notes (Signed)
Pharmacist Chemotherapy Monitoring - Initial Assessment    Anticipated start date: 12/18/19   Regimen:  . Are orders appropriate based on the patient's diagnosis, regimen, and cycle? Yes . Does the plan date match the patient's scheduled date? Yes . Is the sequencing of drugs appropriate? Yes . Are the premedications appropriate for the patient's regimen? Yes . Prior Authorization for treatment is: Approved o If applicable, is the correct biosimilar selected based on the patient's insurance? no  Organ Function and Labs: Marland Kitchen Are dose adjustments needed based on the patient's renal function, hepatic function, or hematologic function? Yes . Are appropriate labs ordered prior to the start of patient's treatment? Yes . Other organ system assessment, if indicated: N/A . The following baseline labs, if indicated, have been ordered: N/A  Dose Assessment: . Are the drug doses appropriate? Yes . Are the following correct: o Drug concentrations Yes o IV fluid compatible with drug Yes o Administration routes Yes o Timing of therapy Yes . If applicable, does the patient have documented access for treatment and/or plans for port-a-cath placement? not applicable . If applicable, have lifetime cumulative doses been properly documented and assessed? no Lifetime Dose Tracking  No doses have been documented on this patient for the following tracked chemicals: Doxorubicin, Epirubicin, Idarubicin, Daunorubicin, Mitoxantrone, Bleomycin, Oxaliplatin, Carboplatin, Liposomal Doxorubicin  o   Toxicity Monitoring/Prevention: . The patient has the following take home antiemetics prescribed: Prochlorperazine . The patient has the following take home medications prescribed: N/A . Medication allergies and previous infusion related reactions, if applicable, have been reviewed and addressed. Yes . The patient's current medication list has been assessed for drug-drug interactions with their chemotherapy regimen. no  significant drug-drug interactions were identified on review.  Order Review: . Are the treatment plan orders signed? Yes . Is the patient scheduled to see a provider prior to their treatment? Yes  I verify that I have reviewed each item in the above checklist and answered each question accordingly.  Ziad Maye K 12/12/2019 8:22 AM

## 2019-12-14 ENCOUNTER — Encounter (HOSPITAL_COMMUNITY): Payer: Self-pay

## 2019-12-14 ENCOUNTER — Other Ambulatory Visit: Payer: Self-pay

## 2019-12-14 ENCOUNTER — Ambulatory Visit (HOSPITAL_COMMUNITY)
Admission: EM | Admit: 2019-12-14 | Discharge: 2019-12-14 | Disposition: A | Discharge status: Home / Self Care | Payer: Medicare Other | Attending: Family Medicine | Admitting: Family Medicine

## 2019-12-14 DIAGNOSIS — J3489 Other specified disorders of nose and nasal sinuses: Secondary | ICD-10-CM

## 2019-12-14 MED ORDER — MUPIROCIN 2 % EX OINT: 1.0000 "application " | TOPICAL_OINTMENT | Freq: Two times a day (BID) | CUTANEOUS | 0 refills | Status: DC

## 2019-12-14 MED ORDER — CLINDAMYCIN HCL 300 MG PO CAPS: 300.0000 mg | ORAL_CAPSULE | Freq: Three times a day (TID) | ORAL | 0 refills | Status: AC

## 2019-12-14 MED ORDER — CLINDAMYCIN HCL 150 MG PO CAPS: 150.0000 mg | ORAL_CAPSULE | Freq: Three times a day (TID) | ORAL | 0 refills | Status: DC

## 2019-12-14 NOTE — ED Provider Notes (Signed)
[facility]    CSN: [id number] Arrival date & time: 12/14/19  1546      History   Chief Complaint Chief Complaint  Patient presents with  . Nose Problem    HPI Sarah Lee is a 69 y.o. female history of lung cancer, GERD, asthma, presenting today for evaluation of nasal irritation.  Patient had oral surgery on 3/25, had nasal pharyngeal airway placed during procedure.  Since she has had blood-streaked mucus along with occasional chunks of blood since.  She used Afrin initially.  She is concerned as today the areas have felt slightly more irritated and it has felt slightly swollen in the nose.  Denies using anything else in the nose or any other sprays.  Denies pain or swelling around teeth/gums.  Denies fevers.  Denies bleeding into mouth/throat.  HPI  Past Medical History:  Diagnosis Date  . Allergy   . Anemia   . Arthritis   . Asthma    as a child  . Blood transfusion   . Cancer ([facility])    lung cancer  . Cataract    slight  . Complication of anesthesia    used sodium pentothal and very hard to wake up and arm swelled up from medication  . Constipation   . Gallstones   . GERD (gastroesophageal reflux disease)   . Heart murmur    no issues  . Pneumonia   . Seasonal allergies     Patient Active Problem List   Diagnosis Date Noted  . Adenosquamous carcinoma of left lung ([place] [facility]) 10/23/2019  . Encounter for antineoplastic chemotherapy 10/23/2019  . Goals of care, counseling/discussion 10/23/2019  . S/P thoracotomy 10/01/2019  . Lung cancer ([facility]) 09/18/2019  . Pulmonary infiltrate present on computed tomography 07/11/2019  . Abdominal pain 07/11/2019  . Post ERCP Pancreatitis 10/30/2016  . Asthma   . GERD (gastroesophageal reflux disease)   . Choledocholithiasis s/p ERCP 10/29/2016   . Chronic cholecystitis with calculus s/p lap cholecystectomy 10/28/2016 10/28/2016  . Hemorrhoids 10/12/2012  . Obesity 05/20/2011  . Constipation 05/20/2011    Past  Surgical History:  Procedure Laterality Date  . ABDOMINAL HYSTERECTOMY  2004   fibroids--- TAH/BSO  . BREAST SURGERY     right breast biopsy-benign  . CESAREAN SECTION    . CHOLECYSTECTOMY N/A 10/28/2016   Procedure: LAPAROSCOPIC CHOLECYSTECTOMY WITH INTRAOPERATIVE CHOLANGIOGRAM;  Surgeon: Jackolyn Confer, MD;  [facility];  Service: General;  Laterality: N/A;  . COLONOSCOPY    . ERCP N/A 10/29/2016   Procedure: ENDOSCOPIC RETROGRADE CHOLANGIOPANCREATOGRAPHY (ERCP);  Surgeon: Doran Stabler, MD;  Location: [facility];  Service: Endoscopy;  Laterality: N/A;  . MULTIPLE EXTRACTIONS WITH ALVEOLOPLASTY Bilateral 12/06/2019   Procedure: Extraction of tooth #'s 4, and 7-13 with alveoloplasty  and bilateral maxillary  tuberosity reductions;  Surgeon: Lenn Cal, DDS;  Location: [facility];  Service: Oral Surgery;  Laterality: Bilateral;  . NODE DISSECTION Left 10/01/2019   Procedure: Node Dissection;  Surgeon: [name], MD;  Location: [facility];  Service: Thoracic;  Laterality: Left;  . POLYPECTOMY    . UTERINE FIBROID SURGERY    . VIDEO BRONCHOSCOPY N/A 10/01/2019   Procedure: VIDEO BRONCHOSCOPY;  Surgeon: [name], MD;  [facility];  Service: Thoracic;  Laterality: N/A;    OB History    Gravida  6   Para  2   Term  2   Preterm      AB  4  Living  2     SAB  4   TAB      Ectopic      Multiple      Live Births               Home Medications    Prior to Admission medications   Medication Sig Start Date End Date Taking? Authorizing Provider  Acetylcysteine (N-ACETYL-L-CYSTEINE PO) Take 1 tablet by mouth daily.     [provider]  Aromatic Inhalants (VICKS VAPOR IN) Place 1 spray into the nose as needed (congestion.).     [provider]  BLACK ELDERBERRY PO Take 2 capsules by mouth daily.    [provider]  Cholecalciferol (VITAMIN D-3) 125 MCG (5000 UT) TABS Take 10,000 Units by mouth daily.    [provider]  clindamycin (CLEOCIN) 300 MG capsule Take 1 capsule (300 mg total) by mouth 3 (three) times daily for 7 days. 12/14/19 12/21/19  Senan Urey C, PA-C  dexamethasone (DECADRON) 4 MG tablet 1 tablet p.o. twice daily the day before, day of and day after chemotherapy every 3 weeks 10/23/19   Curt Bears, MD  folic acid (FOLVITE) 1 MG tablet Take 1 tablet (1 mg total) by mouth daily. 10/23/19   Curt Bears, MD  Magnesium 200 MG TABS Take 200 mg by mouth daily.     [provider]  mupirocin ointment (BACTROBAN) 2 % Apply 1 application topically 2 (two) times daily. 12/14/19   Calib Wadhwa C, PA-C  OVER THE COUNTER MEDICATION Take 1 capsule by mouth daily. Flamex Rx    [provider]  OVER THE COUNTER MEDICATION Take 1 capsule by mouth daily. Ultra Flora Probiotic    [provider]  pregabalin (LYRICA) 25 MG capsule Take 1 capsule (25 mg total) by mouth 2 (two) times daily. Patient taking differently: Take 25 mg by mouth 2 (two) times daily as needed (pain).  11/23/19   [name], MD  prochlorperazine (COMPAZINE) 10 MG tablet Take 1 tablet (10 mg total) by mouth every 6 (six) hours as needed for nausea or vomiting. 10/23/19   Curt Bears, MD  traMADol (ULTRAM) 50 MG tablet Take 1 tablet (50 mg total) by mouth every 6 (six) hours as needed. 12/06/19 12/05/20  Lenn Cal, DDS  Zinc 30 MG TABS Take 30 mg by mouth daily.    [provider]    Family History Family History  Problem Relation Age of Onset  . Cancer Father        prostate  . Anemia Mother   . Stomach cancer Cousin        maternal side   . Stomach cancer Maternal Uncle   . Colon cancer Neg Hx   . Esophageal cancer Neg Hx   . Rectal cancer Neg Hx   . Colon polyps Neg Hx     Social History Social History   Tobacco Use  . Smoking status: Former Smoker    Packs/day: 1.50    Years: 25.00    Pack years: 37.50    Types: Cigarettes    Quit date: 03/02/1988      Years since quitting: 31.8  . Smokeless tobacco: Never Used  Substance Use Topics  . Alcohol use: Yes    Comment: wine occassionally  . Drug use: Never     Allergies   Advil [ibuprofen], Iron, Penicillins, and Tetracyclines & related   Review of Systems Review of Systems  Constitutional: Negative  for activity change, appetite change, chills, fatigue and fever.  HENT: Positive for congestion, nosebleeds and rhinorrhea. Negative for ear pain, sinus pressure, sore throat and trouble swallowing.   Eyes: Negative for discharge and redness.  Respiratory: Negative for cough, chest tightness and shortness of breath.   Cardiovascular: Negative for chest pain.  Gastrointestinal: Negative for abdominal pain, diarrhea, nausea and vomiting.  Musculoskeletal: Negative for myalgias.  Skin: Negative for rash.  Neurological: Negative for dizziness, light-headedness and headaches.     Physical Exam Triage Vital Signs ED Triage Vitals  Enc Vitals Group     BP 12/14/19 1607 (!) 121/58     Pulse Rate 12/14/19 1607 67     Resp 12/14/19 1607 19     Temp 12/14/19 1607 97.9 F (36.6 C)     Temp Source 12/14/19 1607 Oral     SpO2 12/14/19 1607 100 %     Weight --      Height --      Head Circumference --      Peak Flow --      Pain Score 12/14/19 1600 0     Pain Loc --      Pain Edu? --      Excl. in [place]? --    No data found.  Updated Vital Signs BP (!) 121/58 (BP Location: Right Arm)   Pulse 67   Temp 97.9 F (36.6 C) (Oral)   Resp 19   LMP 01/17/1994   SpO2 100%   Visual Acuity Right Eye Distance:   Left Eye Distance:   Bilateral Distance:    Right Eye Near:   Left Eye Near:    Bilateral Near:     Physical Exam Vitals and nursing note reviewed.  Constitutional:      Appearance: She is well-developed.     Comments: No acute distress  HENT:     Head: Normocephalic and atraumatic.     Ears:     Comments: Bilateral ears without tenderness to palpation of external  auricle, tragus and mastoid, EAC's without erythema or swelling, TM's with good bony landmarks and cone of light. Non erythematous.     Nose: Nose normal.     Comments: Nasal mucosa pink, slightly swollen turbinates, left nares does have a few areas of irritation, but no swelling around these areas, nontender to palpation of soft tissue of distal nose    Mouth/Throat:     Comments: Oral mucosa pink and moist, no tonsillar enlargement or exudate. Posterior pharynx patent and nonerythematous, no uvula deviation or swelling. Normal phonation.  All dentition missing to upper jaw, sutures in place, no surrounding erythema or gingival tenderness, no discharge noted Eyes:     Conjunctiva/sclera: Conjunctivae normal.  Cardiovascular:     Rate and Rhythm: Normal rate.  Pulmonary:     Effort: Pulmonary effort is normal. No respiratory distress.  Abdominal:     General: There is no distension.  Musculoskeletal:        General: Normal range of motion.     Cervical back: Neck supple.  Skin:    General: Skin is warm and dry.  Neurological:     Mental Status: She is alert and oriented to person, place, and time.      UC Treatments / Results  Labs (all labs ordered are listed, but only abnormal results are displayed) Labs Reviewed - No data to display  EKG   Radiology No results found.  Procedures Procedures (including critical care time)  Medications Ordered in UC Medications - No data to display  Initial Impression / Assessment and Plan / UC Course  I have reviewed the triage vital signs and the nursing notes.  Pertinent labs & imaging results that were available during my care of the patient were reviewed by me and considered in my medical decision making (see chart for details).     Recommending moisturizing measures to nasal mucosa along with using Bactroban to help prevent infection.  Do not see any obvious source of infection at this time, the patient is concerned about her  upcoming last round of chemo, provided printed prescription for clindamycin to fill if patient is developing more pain swelling and redness to area of nose or mouth develop to treat for more cellulitic infection.  Discussed strict return precautions. Patient verbalized understanding and is agreeable with plan.  Final Clinical Impressions(s) / UC Diagnoses   Final diagnoses:  Nose irritation     Discharge Instructions     Bactroban/mupiricin twice daily for 7-10 days If developing increased swelling redness or pain may fill prescription for clindamycin  Please follow-up for any concerns   ED Prescriptions    Medication Sig Dispense Auth. Provider   mupirocin ointment (BACTROBAN) 2 % Apply 1 application topically 2 (two) times daily. 30 g Romar Woodrick C, PA-C   clindamycin (CLEOCIN) 150 MG capsule  (Status: Discontinued) Take 1 capsule (150 mg total) by mouth 3 (three) times daily for 7 days. 21 capsule Morgan Rennert C, PA-C   clindamycin (CLEOCIN) 300 MG capsule Take 1 capsule (300 mg total) by mouth 3 (three) times daily for 7 days. 21 capsule Jermond Burkemper, [place] C, PA-C     PDMP not reviewed this encounter.   Janith Lima, [place] 12/14/19 1646

## 2019-12-14 NOTE — ED Triage Notes (Signed)
C/o blood in nasal mucous and irritation of the left nostril. Pt had surgery on 12/06/2019 and reports that the nasal bleeding and irritation started immediately after surgery. Pt has tried f/u with cancer doctor, and the surgeon who preformed procedure Dr. Enrique Sack, who told her to call the hospital. Upon calling, the operator was unsure of who to put her in touch with, so patient proceeded to UC.

## 2019-12-14 NOTE — Discharge Instructions (Signed)
Bactroban/mupiricin twice daily for 7-10 days If developing increased swelling redness or pain may fill prescription for clindamycin  Please follow-up for any concerns

## 2019-12-17 ENCOUNTER — Other Ambulatory Visit: Payer: Self-pay

## 2019-12-17 ENCOUNTER — Encounter (HOSPITAL_COMMUNITY): Payer: Self-pay | Admitting: Dentistry

## 2019-12-17 ENCOUNTER — Ambulatory Visit (HOSPITAL_COMMUNITY): Payer: Medicare Other | Admitting: Dentistry

## 2019-12-17 VITALS — BP 124/62 | HR 87 | Temp 98.4°F

## 2019-12-17 DIAGNOSIS — K08199 Complete loss of teeth due to other specified cause, unspecified class: Secondary | ICD-10-CM

## 2019-12-17 DIAGNOSIS — K08409 Partial loss of teeth, unspecified cause, unspecified class: Secondary | ICD-10-CM

## 2019-12-17 DIAGNOSIS — C3492 Malignant neoplasm of unspecified part of left bronchus or lung: Secondary | ICD-10-CM

## 2019-12-17 NOTE — Patient Instructions (Addendum)
Patient:            Sarah Lee Date of Birth:  01/02/1951 MRN:                [id number]  COVID-19 Education: The signs and symptoms of COVID-19 were discussed with the patient and how to seek care for testing (follow up with PCP or arrange E-visit).   The importance of social distancing was discussed today.  COVID-19 Vaccine Information can be found at: ShippingScam.co.uk For questions related to vaccine distribution or appointments, please email vaccine@[email or web address] .[email or web address] or call (682)690-8841.   PLAN: 1.  Continue salt water rinses as needed to aid healing. 2.  Advance diet as tolerated. 3.  Follow-up with Dr. Noah Charon for evaluation of upper complete denture after adequate healing. 4.  Continue chemotherapy with Dr. Earlie Server as indicated.   Lenn Cal, DDS

## 2019-12-17 NOTE — Progress Notes (Signed)
POST OPERATIVE NOTE:  12/17/2019 JENNALYNN RIVARD [id number]  COVID 19 SCREENING: The patient does not symptoms concerning for COVID-19 infection (Including fever, chills, cough, or new SHORTNESS OF BREATH).    VITALS: BP 124/62 (BP Location: Right Arm)   Pulse 87   Temp 98.4 F (36.9 C)   LMP 01/17/1994    LABS:  Lab Results  Component Value Date   WBC 5.9 12/11/2019   HGB 13.7 12/11/2019   HCT 42.2 12/11/2019   MCV 89.2 12/11/2019   PLT 259 12/11/2019   BMET    Component Value Date/Time   NA 141 12/11/2019 0836   K 4.3 12/11/2019 0836   CL 108 12/11/2019 0836   CO2 22 12/11/2019 0836   GLUCOSE 111 (H) 12/11/2019 0836   BUN 10 12/11/2019 0836   CREATININE 0.76 12/11/2019 0836   CALCIUM 10.1 12/11/2019 0836   GFRNONAA >60 12/11/2019 0836   GFRAA >60 12/11/2019 0836    Lab Results  Component Value Date   INR 1.0 09/27/2019   INR 0.9 09/03/2019   No results found for: PTT   BRENNYN Lee is status post multiple extraction of maxillary teeth with alveoloplasty and bilateral tuberosity reductions on 12/06/2019 with general anesthesia.  Patient now presents for evaluation of healing and suture removal  SUBJECTIVE: Patient without significant complaints of pain.  Patient may have several stitches that still remain.  EXAM: There is no sign of infection, heme, or ooze. Several sutures remain loosely.  Patient is healing in by generalized primary closure. The bilateral tuberosity reduction has left adequate room between the maxillary and mandibular arches.  PROCEDURE: The patient was given a chlorhexidine gluconate rinse for 30 seconds. Sutures were then removed without complication. Patient tolerated the procedure well.  ASSESSMENT: Post operative course is consistent with dental procedures performed in the operating room with general anesthesia Loss of teeth due to extraction Atrophy of edentulous alveolar ridges.   PLAN: 1.  Continue salt water rinses as needed  to aid healing. 2.  Advance diet as tolerated. 3.  Follow-up with Dr. Noah Charon for evaluation of upper complete denture after adequate healing. 4.  Continue chemotherapy with Dr. Earlie Server as indicated.   Lenn Cal, DDS

## 2019-12-18 ENCOUNTER — Inpatient Hospital Stay: Payer: BC Managed Care – PPO

## 2019-12-18 ENCOUNTER — Inpatient Hospital Stay: Payer: BC Managed Care – PPO | Attending: Internal Medicine

## 2019-12-18 ENCOUNTER — Inpatient Hospital Stay (HOSPITAL_BASED_OUTPATIENT_CLINIC_OR_DEPARTMENT_OTHER): Payer: BC Managed Care – PPO | Admitting: Medical

## 2019-12-18 ENCOUNTER — Other Ambulatory Visit: Payer: Self-pay

## 2019-12-18 VITALS — BP 121/62 | HR 86 | Temp 98.5°F | Resp 18

## 2019-12-18 DIAGNOSIS — R519 Headache, unspecified: Secondary | ICD-10-CM | POA: Insufficient documentation

## 2019-12-18 DIAGNOSIS — H5589 Other irregular eye movements: Secondary | ICD-10-CM | POA: Diagnosis not present

## 2019-12-18 DIAGNOSIS — Z5111 Encounter for antineoplastic chemotherapy: Secondary | ICD-10-CM | POA: Insufficient documentation

## 2019-12-18 DIAGNOSIS — C3492 Malignant neoplasm of unspecified part of left bronchus or lung: Secondary | ICD-10-CM

## 2019-12-18 DIAGNOSIS — N951 Menopausal and female climacteric states: Secondary | ICD-10-CM | POA: Insufficient documentation

## 2019-12-18 DIAGNOSIS — T8090XA Unspecified complication following infusion and therapeutic injection, initial encounter: Secondary | ICD-10-CM | POA: Diagnosis not present

## 2019-12-18 DIAGNOSIS — C3432 Malignant neoplasm of lower lobe, left bronchus or lung: Secondary | ICD-10-CM | POA: Diagnosis present

## 2019-12-18 LAB — CMP (CANCER CENTER ONLY)
ALT: 18 U/L (ref 0–44)
AST: 16 U/L (ref 15–41)
Albumin: 3.8 g/dL (ref 3.5–5.0)
Alkaline Phosphatase: 91 U/L (ref 38–126)
Anion gap: 11 (ref 5–15)
BUN: 12 mg/dL (ref 8–23)
CO2: 21 mmol/L — ABNORMAL LOW (ref 22–32)
Calcium: 9.8 mg/dL (ref 8.9–10.3)
Chloride: 108 mmol/L (ref 98–111)
Creatinine: 0.81 mg/dL (ref 0.44–1.00)
GFR, Est AFR Am: >60 mL/min (ref >60)
GFR, Estimated: >60 mL/min (ref >60)
Glucose, Bld: 158 mg/dL — ABNORMAL HIGH (ref 70–99)
Potassium: 4 mmol/L (ref 3.5–5.1)
Sodium: 140 mmol/L (ref 135–145)
Total Bilirubin: 0.4 mg/dL (ref 0.3–1.2)
Total Protein: 7.5 g/dL (ref 6.5–8.1)

## 2019-12-18 LAB — CBC WITH DIFFERENTIAL (CANCER CENTER ONLY)
Abs Immature Granulocytes: 0.03 10*3/uL (ref 0.00–0.07)
Basophils Absolute: 0 10*3/uL (ref 0.0–0.1)
Basophils Relative: 0 %
Eosinophils Absolute: 0 10*3/uL (ref 0.0–0.5)
Eosinophils Relative: 0 %
HCT: 41.6 % (ref 36.0–46.0)
Hemoglobin: 13.3 g/dL (ref 12.0–15.0)
Immature Granulocytes: 1 %
Lymphocytes Relative: 10 %
Lymphs Abs: 0.6 10*3/uL — ABNORMAL LOW (ref 0.7–4.0)
MCH: 29.1 pg (ref 26.0–34.0)
MCHC: 32 g/dL (ref 30.0–36.0)
MCV: 91 fL (ref 80.0–100.0)
Monocytes Absolute: 0 10*3/uL — ABNORMAL LOW (ref 0.1–1.0)
Monocytes Relative: 1 %
Neutro Abs: 5.8 10*3/uL (ref 1.7–7.7)
Neutrophils Relative %: 88 %
Platelet Count: 264 10*3/uL (ref 150–400)
RBC: 4.57 MIL/uL (ref 3.87–5.11)
RDW: 12.7 % (ref 11.5–15.5)
WBC Count: 6.5 10*3/uL (ref 4.0–10.5)
nRBC: 0 % (ref 0.0–0.2)

## 2019-12-18 LAB — MAGNESIUM: Magnesium: 1.9 mg/dL (ref 1.7–2.4)

## 2019-12-18 MED ORDER — PALONOSETRON HCL INJECTION 0.25 MG/5ML
INTRAVENOUS | Status: AC
  Filled 2019-12-18: qty 5

## 2019-12-18 MED ORDER — FAMOTIDINE IN NACL 20-0.9 MG/50ML-% IV SOLN
20.0000 mg | Freq: Once | INTRAVENOUS | Status: AC
  Administered 2019-12-18: 20 mg via INTRAVENOUS

## 2019-12-18 MED ORDER — POTASSIUM CHLORIDE 2 MEQ/ML IV SOLN
Freq: Once | INTRAVENOUS | Status: AC
  Filled 2019-12-18: qty 10

## 2019-12-18 MED ORDER — SODIUM CHLORIDE 0.9 % IV SOLN
150.0000 mg | Freq: Once | INTRAVENOUS | Status: AC
  Administered 2019-12-18: 150 mg via INTRAVENOUS
  Filled 2019-12-18: qty 150

## 2019-12-18 MED ORDER — ACETAMINOPHEN 325 MG PO TABS
650.0000 mg | ORAL_TABLET | Freq: Once | ORAL | Status: AC
  Administered 2019-12-18: 650 mg via ORAL

## 2019-12-18 MED ORDER — SODIUM CHLORIDE 0.9 % IV SOLN
75.0000 mg/m2 | Freq: Once | INTRAVENOUS | Status: AC
  Administered 2019-12-18: 157 mg via INTRAVENOUS
  Filled 2019-12-18: qty 157

## 2019-12-18 MED ORDER — SODIUM CHLORIDE 0.9 % IV SOLN
500.0000 mg/m2 | Freq: Once | INTRAVENOUS | Status: AC
  Administered 2019-12-18: 1000 mg via INTRAVENOUS
  Filled 2019-12-18: qty 40

## 2019-12-18 MED ORDER — PALONOSETRON HCL INJECTION 0.25 MG/5ML
0.2500 mg | Freq: Once | INTRAVENOUS | Status: AC
  Administered 2019-12-18: 0.25 mg via INTRAVENOUS

## 2019-12-18 MED ORDER — SODIUM CHLORIDE 0.9 % IV SOLN
10.0000 mg | Freq: Once | INTRAVENOUS | Status: AC
  Administered 2019-12-18: 10 mg via INTRAVENOUS
  Filled 2019-12-18: qty 10

## 2019-12-18 MED ORDER — SODIUM CHLORIDE 0.9 % IV SOLN
Freq: Once | INTRAVENOUS | Status: AC
  Filled 2019-12-18: qty 250

## 2019-12-18 NOTE — Progress Notes (Signed)
During urination, pt reported pelvis and back pain, for the second time today. Pain reported at 4/10, sharp and brief.  Pt has returned to chair, states pain has lessened but still aches.

## 2019-12-18 NOTE — Patient Instructions (Signed)
[facility] Discharge Instructions for Patients Receiving Chemotherapy  Today you received the following chemotherapy agents pemetrexed; cisplatin  To help prevent nausea and vomiting after your treatment, we encourage you to take your nausea medication as directed   If you develop nausea and vomiting that is not controlled by your nausea medication, call the clinic.   BELOW ARE SYMPTOMS THAT SHOULD BE REPORTED IMMEDIATELY:  *FEVER GREATER THAN 100.5 F  *CHILLS WITH OR WITHOUT FEVER  NAUSEA AND VOMITING THAT IS NOT CONTROLLED WITH YOUR NAUSEA MEDICATION  *UNUSUAL SHORTNESS OF BREATH  *UNUSUAL BRUISING OR BLEEDING  TENDERNESS IN MOUTH AND THROAT WITH OR WITHOUT PRESENCE OF ULCERS  *URINARY PROBLEMS  *BOWEL PROBLEMS  UNUSUAL RASH Items with * indicate a potential emergency and should be followed up as soon as possible.  Feel free to call the clinic should you have any questions or concerns. The clinic phone number is [phone].  Please show the [facility] at check-in to the Emergency Department and triage nurse.  Cisplatin injection What is this medicine? CISPLATIN (SIS pla tin) is a chemotherapy drug. It targets fast dividing cells, like cancer cells, and causes these cells to die. This medicine is used to treat many types of cancer like bladder, ovarian, and testicular cancers. This medicine may be used for other purposes; ask your health care provider or pharmacist if you have questions. COMMON BRAND NAME(S): Platinol, Platinol -AQ What should I tell my health care provider before I take this medicine? They need to know if you have any of these conditions:  eye disease, vision problems  hearing problems  kidney disease  low blood counts, like white cells, platelets, or red blood cells  tingling of the fingers or toes, or other nerve disorder  an unusual or allergic reaction to cisplatin, carboplatin, oxaliplatin, other medicines, foods,  dyes, or preservatives  pregnant or trying to get pregnant  breast-feeding How should I use this medicine? This drug is given as an infusion into a vein. It is administered in a hospital or clinic by a specially trained health care professional. Talk to your pediatrician regarding the use of this medicine in children. Special care may be needed. Overdosage: If you think you have taken too much of this medicine contact a poison control center or emergency room at once. NOTE: This medicine is only for you. Do not share this medicine with others. What if I miss a dose? It is important not to miss a dose. Call your doctor or health care professional if you are unable to keep an appointment. What may interact with this medicine? This medicine may interact with the following medications:  foscarnet  certain antibiotics like amikacin, gentamicin, neomycin, polymyxin B, streptomycin, tobramycin, vancomycin This list may not describe all possible interactions. Give your health care provider a list of all the medicines, herbs, non-prescription drugs, or dietary supplements you use. Also tell them if you smoke, drink alcohol, or use illegal drugs. Some items may interact with your medicine. What should I watch for while using this medicine? Your condition will be monitored carefully while you are receiving this medicine. You will need important blood work done while you are taking this medicine. This drug may make you feel generally unwell. This is not uncommon, as chemotherapy can affect healthy cells as well as cancer cells. Report any side effects. Continue your course of treatment even though you feel ill unless your doctor tells you to stop. This medicine may increase  your risk of getting an infection. Call your healthcare professional for advice if you get a fever, chills, or sore throat, or other symptoms of a cold or flu. Do not treat yourself. Try to avoid being around people who are sick. Avoid  taking medicines that contain aspirin, acetaminophen, ibuprofen, naproxen, or ketoprofen unless instructed by your healthcare professional. These medicines may hide a fever. This medicine may increase your risk to bruise or bleed. Call your doctor or health care professional if you notice any unusual bleeding. Be careful brushing and flossing your teeth or using a toothpick because you may get an infection or bleed more easily. If you have any dental work done, tell your dentist you are receiving this medicine. Do not become pregnant while taking this medicine or for 14 months after stopping it. Women should inform their healthcare professional if they wish to become pregnant or think they might be pregnant. Men should not father a child while taking this medicine and for 11 months after stopping it. There is potential for serious side effects to an unborn child. Talk to your healthcare professional for more information. Do not breast-feed an infant while taking this medicine. This medicine has caused ovarian failure in some women. This medicine may make it more difficult to get pregnant. Talk to your healthcare professional if you are concerned about your fertility. This medicine has caused decreased sperm counts in some men. This may make it more difficult to father a child. Talk to your healthcare professional if you are concerned about your fertility. Drink fluids as directed while you are taking this medicine. This will help protect your kidneys. Call your doctor or health care professional if you get diarrhea. Do not treat yourself. What side effects may I notice from receiving this medicine? Side effects that you should report to your doctor or health care professional as soon as possible:  allergic reactions like skin rash, itching or hives, swelling of the face, lips, or tongue  blurred vision  changes in vision  decreased hearing or ringing of the ears  nausea, vomiting  pain,  redness, or irritation at site where injected  pain, tingling, numbness in the hands or feet  signs and symptoms of bleeding such as bloody or black, tarry stools; red or dark brown urine; spitting up blood or brown material that looks like coffee grounds; red spots on the skin; unusual bruising or bleeding from the eyes, gums, or nose  signs and symptoms of infection like fever; chills; cough; sore throat; pain or trouble passing urine  signs and symptoms of kidney injury like trouble passing urine or change in the amount of urine  signs and symptoms of low red blood cells or anemia such as unusually weak or tired; feeling faint or lightheaded; falls; breathing problems Side effects that usually do not require medical attention (report to your doctor or health care professional if they continue or are bothersome):  loss of appetite  mouth sores  muscle cramps This list may not describe all possible side effects. Call your doctor for medical advice about side effects. You may report side effects to FDA at 1-800-FDA-1088. Where should I keep my medicine? This drug is given in a hospital or clinic and will not be stored at home. NOTE: This sheet is a summary. It may not cover all possible information. If you have questions about this medicine, talk to your doctor, pharmacist, or health care provider.  2020 Elsevier/Gold Standard (2018-08-25 15:59:17)   Pemetrexed  injection What is this medicine? PEMETREXED (PEM e TREX ed) is a chemotherapy drug used to treat lung cancers like non-small cell lung cancer and mesothelioma. It may also be used to treat other cancers. This medicine may be used for other purposes; ask your health care provider or pharmacist if you have questions. COMMON BRAND NAME(S): Alimta What should I tell my health care provider before I take this medicine? They need to know if you have any of these conditions:  infection (especially a virus infection such as  chickenpox, cold sores, or herpes)  kidney disease  low blood counts, like low white cell, platelet, or red cell counts  lung or breathing disease, like asthma  radiation therapy  an unusual or allergic reaction to pemetrexed, other medicines, foods, dyes, or preservative  pregnant or trying to get pregnant  breast-feeding How should I use this medicine? This drug is given as an infusion into a vein. It is administered in a hospital or clinic by a specially trained health care professional. Talk to your pediatrician regarding the use of this medicine in children. Special care may be needed. Overdosage: If you think you have taken too much of this medicine contact a poison control center or emergency room at once. NOTE: This medicine is only for you. Do not share this medicine with others. What if I miss a dose? It is important not to miss your dose. Call your doctor or health care professional if you are unable to keep an appointment. What may interact with this medicine? This medicine may interact with the following medications:  Ibuprofen This list may not describe all possible interactions. Give your health care provider a list of all the medicines, herbs, non-prescription drugs, or dietary supplements you use. Also tell them if you smoke, drink alcohol, or use illegal drugs. Some items may interact with your medicine. What should I watch for while using this medicine? Visit your doctor for checks on your progress. This drug may make you feel generally unwell. This is not uncommon, as chemotherapy can affect healthy cells as well as cancer cells. Report any side effects. Continue your course of treatment even though you feel ill unless your doctor tells you to stop. In some cases, you may be given additional medicines to help with side effects. Follow all directions for their use. Call your doctor or health care professional for advice if you get a fever, chills or sore throat, or  other symptoms of a cold or flu. Do not treat yourself. This drug decreases your body's ability to fight infections. Try to avoid being around people who are sick. This medicine may increase your risk to bruise or bleed. Call your doctor or health care professional if you notice any unusual bleeding. Be careful brushing and flossing your teeth or using a toothpick because you may get an infection or bleed more easily. If you have any dental work done, tell your dentist you are receiving this medicine. Avoid taking products that contain aspirin, acetaminophen, ibuprofen, naproxen, or ketoprofen unless instructed by your doctor. These medicines may hide a fever. Call your doctor or health care professional if you get diarrhea or mouth sores. Do not treat yourself. To protect your kidneys, drink water or other fluids as directed while you are taking this medicine. Do not become pregnant while taking this medicine or for 6 months after stopping it. Women should inform their doctor if they wish to become pregnant or think they might be pregnant. Men should  not father a child while taking this medicine and for 3 months after stopping it. This may interfere with the ability to father a child. You should talk to your doctor or health care professional if you are concerned about your fertility. There is a potential for serious side effects to an unborn child. Talk to your health care professional or pharmacist for more information. Do not breast-feed an infant while taking this medicine or for 1 week after stopping it. What side effects may I notice from receiving this medicine? Side effects that you should report to your doctor or health care professional as soon as possible:  allergic reactions like skin rash, itching or hives, swelling of the face, lips, or tongue  breathing problems  redness, blistering, peeling or loosening of the skin, including inside the mouth  signs and symptoms of bleeding such as  bloody or black, tarry stools; red or dark-brown urine; spitting up blood or brown material that looks like coffee grounds; red spots on the skin; unusual bruising or bleeding from the eye, gums, or nose  signs and symptoms of infection like fever or chills; cough; sore throat; pain or trouble passing urine  signs and symptoms of kidney injury like trouble passing urine or change in the amount of urine  signs and symptoms of liver injury like dark yellow or brown urine; general ill feeling or flu-like symptoms; light-colored stools; loss of appetite; nausea; right upper belly pain; unusually weak or tired; yellowing of the eyes or skin Side effects that usually do not require medical attention (report to your doctor or health care professional if they continue or are bothersome):  constipation  mouth sores  nausea, vomiting  unusually weak or tired This list may not describe all possible side effects. Call your doctor for medical advice about side effects. You may report side effects to FDA at 1-800-FDA-1088. Where should I keep my medicine? This drug is given in a hospital or clinic and will not be stored at home. NOTE: This sheet is a summary. It may not cover all possible information. If you have questions about this medicine, talk to your doctor, pharmacist, or health care provider.  2020 Elsevier/Gold Standard (2017-10-19 16:11:33)

## 2019-12-18 NOTE — Progress Notes (Signed)
    DATE:  12/18/2019                                          X  CHEMO/IMMUNOTHERAPY REACTION            MD:  Dr. Fanny Bien. Mohamed   AGENT/BLOOD PRODUCT RECEIVING TODAY:               Alimta and cisplatin   AGENT/BLOOD PRODUCT RECEIVING IMMEDIATELY PRIOR TO REACTION:           Initially Aloxi and Decadron and again when receiving Alimta   REACTION(S):           Headache after receiving Aloxi and Decadron then hot flashes and eye fluttering after beginning Alimta.   PREMEDS:      Aloxi, Decadron, and Emend   INTERVENTION: The patient was given Tylenol 650 mg p.o. x1 after she reported having a headache after receiving Aloxi and Decadron.  The patient was then given Pepcid 20 mg IV x1 after stating that she was having hot flashes and eye fluttering after beginning Alimta.   Review of Systems  Review of Systems  Constitutional: Negative for chills, diaphoresis and fever.       Hot flashes  HENT: Negative for trouble swallowing and voice change.   Respiratory: Negative for cough, chest tightness, shortness of breath and wheezing.   Cardiovascular: Negative for chest pain and palpitations.  Gastrointestinal: Negative for abdominal pain, constipation, diarrhea, nausea and vomiting.  Musculoskeletal: Negative for back pain and myalgias.  Neurological: Positive for headaches. Negative for dizziness and light-headedness.       Eye fluttering     Physical Exam  Physical Exam Constitutional:      General: She is not in acute distress.    Appearance: She is not diaphoretic.  HENT:     Head: Normocephalic and atraumatic.  Eyes:     General: No scleral icterus.       Right eye: No discharge.        Left eye: No discharge.     Conjunctiva/sclera: Conjunctivae normal.  Cardiovascular:     Rate and Rhythm: Normal rate and regular rhythm.     Heart sounds: Normal heart sounds. No murmur. No friction rub. No gallop.   Pulmonary:     Effort: Pulmonary effort is normal. No respiratory  distress.     Breath sounds: Normal breath sounds. No wheezing or rales.  Skin:    General: Skin is warm and dry.     Findings: No erythema or rash.  Neurological:     Mental Status: She is alert.     OUTCOME:                The patient's symptoms abated each time and she was able to complete her chemotherapy without additional issues of concern.   Sandi Mealy, MHS, PA-C

## 2019-12-18 NOTE — Progress Notes (Signed)
At approximately 1355, pt c/o of being hot towards the end of alimta transfusion, also c/o of headache. The infusion was stopped and fluids were ran wide open.  Sarah Lee tanner called and ordered med. See MAR, VS remained stable.  The nurse was instructed to re-start infusion. Patient returned to baseline and had no further complaints.

## 2019-12-19 ENCOUNTER — Telehealth: Payer: Self-pay | Admitting: *Deleted

## 2019-12-19 NOTE — Telephone Encounter (Addendum)
-----   Message from Priscille Loveless, RN sent at 12/18/2019 11:16 AM EDT ----- Regarding: first chemo follow-up Mohamed First cisplatin and pemetrexed follow-up  Per chemo follow up call pt states she is " feeling much better from yesterday "  Regarding the " back pain and spasms that went around to my front"  She states has continued headache.  She states she is drinking well and denies any nausea.  She is not taking anything for the minimal residual back pain.  This RN discussed above as well use of tylenol (2 tabs) for the back pain- this RN also informed her some patients find that the tylenol with 1 benadryl tablet ( 25 mg ) tablet relieves the headache.  This RN reviewed with pt to call the office for any issues she has that is not being relieved with prescribed medications.  She has " chemo card " if needed.  Betrice did inquire about obtaining a letter from Dr Julien Nordmann to continue with dental work- she states she called to schedule further dental appointments and was informed now that she started chemo they would need a letter giving clearance.  Above discussed with this nurse requesting Brooks to discuss above at next visit with NP.  This RN also asked pt to keep a diary of her symptoms so these can be discussed at visit for best outcome with further treatments as well as coordinating continued dental care.  This note will be forwarded to attending NP for review for follow up visit 12/25/2019.

## 2019-12-22 ENCOUNTER — Other Ambulatory Visit: Payer: Self-pay

## 2019-12-22 ENCOUNTER — Emergency Department (HOSPITAL_COMMUNITY)
Admission: EM | Admit: 2019-12-22 | Discharge: 2019-12-22 | Disposition: A | Discharge status: Home / Self Care | Payer: BC Managed Care – PPO | Attending: Emergency Medicine | Admitting: Emergency Medicine

## 2019-12-22 ENCOUNTER — Encounter (HOSPITAL_COMMUNITY): Payer: Self-pay | Admitting: Emergency Medicine

## 2019-12-22 DIAGNOSIS — K5641 Fecal impaction: Secondary | ICD-10-CM | POA: Insufficient documentation

## 2019-12-22 DIAGNOSIS — Z79899 Other long term (current) drug therapy: Secondary | ICD-10-CM | POA: Diagnosis not present

## 2019-12-22 DIAGNOSIS — R110 Nausea: Secondary | ICD-10-CM | POA: Diagnosis not present

## 2019-12-22 DIAGNOSIS — C3490 Malignant neoplasm of unspecified part of unspecified bronchus or lung: Secondary | ICD-10-CM | POA: Diagnosis not present

## 2019-12-22 DIAGNOSIS — K6289 Other specified diseases of anus and rectum: Secondary | ICD-10-CM | POA: Diagnosis present

## 2019-12-22 LAB — COMPREHENSIVE METABOLIC PANEL
ALT: 18 U/L (ref 0–44)
AST: 21 U/L (ref 15–41)
Albumin: 3.1 g/dL — ABNORMAL LOW (ref 3.5–5.0)
Alkaline Phosphatase: 60 U/L (ref 38–126)
Anion gap: 9 (ref 5–15)
BUN: 15 mg/dL (ref 8–23)
CO2: 28 mmol/L (ref 22–32)
Calcium: 9.1 mg/dL (ref 8.9–10.3)
Chloride: 98 mmol/L (ref 98–111)
Creatinine, Ser: 0.84 mg/dL (ref 0.44–1.00)
GFR calc Af Amer: >60 mL/min (ref >60)
GFR calc non Af Amer: >60 mL/min (ref >60)
Glucose, Bld: 107 mg/dL — ABNORMAL HIGH (ref 70–99)
Potassium: 3.4 mmol/L — ABNORMAL LOW (ref 3.5–5.1)
Sodium: 135 mmol/L (ref 135–145)
Total Bilirubin: 0.6 mg/dL (ref 0.3–1.2)
Total Protein: 6 g/dL — ABNORMAL LOW (ref 6.5–8.1)

## 2019-12-22 LAB — CBC WITH DIFFERENTIAL/PLATELET
Abs Immature Granulocytes: 0.02 10*3/uL (ref 0.00–0.07)
Basophils Absolute: 0 10*3/uL (ref 0.0–0.1)
Basophils Relative: 0 %
Eosinophils Absolute: 0.1 10*3/uL (ref 0.0–0.5)
Eosinophils Relative: 1 %
HCT: 40.5 % (ref 36.0–46.0)
Hemoglobin: 13.3 g/dL (ref 12.0–15.0)
Immature Granulocytes: 0 %
Lymphocytes Relative: 20 %
Lymphs Abs: 1.8 10*3/uL (ref 0.7–4.0)
MCH: 29.4 pg (ref 26.0–34.0)
MCHC: 32.8 g/dL (ref 30.0–36.0)
MCV: 89.6 fL (ref 80.0–100.0)
Monocytes Absolute: 0.1 10*3/uL (ref 0.1–1.0)
Monocytes Relative: 1 %
Neutro Abs: 7.2 10*3/uL (ref 1.7–7.7)
Neutrophils Relative %: 78 %
Platelets: 189 10*3/uL (ref 150–400)
RBC: 4.52 MIL/uL (ref 3.87–5.11)
RDW: 12.3 % (ref 11.5–15.5)
WBC: 9.1 10*3/uL (ref 4.0–10.5)
nRBC: 0 % (ref 0.0–0.2)

## 2019-12-22 LAB — URINALYSIS, ROUTINE W REFLEX MICROSCOPIC
Bilirubin Urine: NEGATIVE
Glucose, UA: NEGATIVE mg/dL
Hgb urine dipstick: NEGATIVE
Ketones, ur: NEGATIVE mg/dL
Leukocytes,Ua: NEGATIVE
Nitrite: NEGATIVE
Protein, ur: NEGATIVE mg/dL
Specific Gravity, Urine: 1.004 — ABNORMAL LOW (ref 1.005–1.030)
pH: 6 (ref 5.0–8.0)

## 2019-12-22 NOTE — ED Provider Notes (Signed)
[facility] EMERGENCY DEPARTMENT Provider Note  CSN: [id number] Arrival date & time: 12/22/19 1525    History Chief Complaint  Patient presents with  . Constipation    HPI  Sarah Lee is a 69 y.o. female with a history of chronic constipation and recently started treatment for lung cancer.  She had her first chemotherapy treatment earlier this week on Tuesday.  She reports had a small bowel movement that day but has not had any stool output since that time.  She reports a feeling that she needs to go but the stool is too hard to come out.  She reports some rectal pain associate with that.  She occasionally has some cramping pain in her abdomen. She has had to take two nausea pills since Tuesday but no vomiting and no fever. She was advised not to use an enema by her oncologists.    Past Medical History:  Diagnosis Date  . Allergy   . Anemia   . Arthritis   . Asthma    as a child  . Blood transfusion   . Cancer ([place])    lung cancer  . Cataract    slight  . Complication of anesthesia    used sodium pentothal and very hard to wake up and arm swelled up from medication  . Constipation   . Gallstones   . GERD (gastroesophageal reflux disease)   . Heart murmur    no issues  . Pneumonia   . Seasonal allergies     Past Surgical History:  Procedure Laterality Date  . ABDOMINAL HYSTERECTOMY  2004   fibroids--- TAH/BSO  . BREAST SURGERY     right breast biopsy-benign  . CESAREAN SECTION    . CHOLECYSTECTOMY N/A 10/28/2016   Procedure: LAPAROSCOPIC CHOLECYSTECTOMY WITH INTRAOPERATIVE CHOLANGIOGRAM;  Surgeon: Jackolyn Confer, MD;  Location: [facility];  Service: General;  Laterality: N/A;  . COLONOSCOPY    . ERCP N/A 10/29/2016   Procedure: ENDOSCOPIC RETROGRADE CHOLANGIOPANCREATOGRAPHY (ERCP);  Surgeon: Doran Stabler, MD;  Location: [facility];  Service: Endoscopy;  Laterality: N/A;  . MULTIPLE EXTRACTIONS WITH ALVEOLOPLASTY Bilateral 12/06/2019   Procedure: Extraction  of tooth #'s 4, and 7-13 with alveoloplasty  and bilateral maxillary  tuberosity reductions;  Surgeon: Lenn Cal, DDS;  Location: [facility];  Service: Oral Surgery;  Laterality: Bilateral;  . NODE DISSECTION Left 10/01/2019   Procedure: Node Dissection;  Surgeon: [name], MD;  Location: [facility];  Service: Thoracic;  Laterality: Left;  . POLYPECTOMY    . UTERINE FIBROID SURGERY    . VIDEO BRONCHOSCOPY N/A 10/01/2019   Procedure: VIDEO BRONCHOSCOPY;  Surgeon: [name], MD;  Location: [facility];  Service: Thoracic;  Laterality: N/A;    Family History  Problem Relation Age of Onset  . Cancer Father        prostate  . Anemia Mother   . Stomach cancer Cousin        maternal side   . Stomach cancer Maternal Uncle   . Colon cancer Neg Hx   . Esophageal cancer Neg Hx   . Rectal cancer Neg Hx   . Colon polyps Neg Hx     Social History   Tobacco Use  . Smoking status: Former Smoker    Packs/day: 1.50    Years: 25.00    Pack years: 37.50    Types: Cigarettes    Quit date: 03/02/1988    Years since quitting: 31.8  . Smokeless tobacco:  Never Used  Substance Use Topics  . Alcohol use: Yes    Comment: wine occassionally  . Drug use: Never     Home Medications Prior to Admission medications   Medication Sig Start Date End Date Taking? Authorizing Provider  Acetylcysteine (N-ACETYL-L-CYSTEINE PO) Take 1 tablet by mouth daily.     [provider]  Aromatic Inhalants (VICKS VAPOR IN) Place 1 spray into the nose as needed (congestion.).     [provider]  BLACK ELDERBERRY PO Take 2 capsules by mouth daily.    [provider]  Cholecalciferol (VITAMIN D-3) 125 MCG (5000 UT) TABS Take 10,000 Units by mouth daily.    [provider]  dexamethasone (DECADRON) 4 MG tablet 1 tablet p.o. twice daily the day before, day of and day after chemotherapy every 3 weeks 10/23/19   Curt Bears, MD  folic acid (FOLVITE) 1 MG tablet Take 1  tablet (1 mg total) by mouth daily. 10/23/19   Curt Bears, MD  Magnesium 200 MG TABS Take 200 mg by mouth daily.     [provider]  mupirocin ointment (BACTROBAN) 2 % Apply 1 application topically 2 (two) times daily. 12/14/19   Wieters, Hallie C, PA-C  OVER THE COUNTER MEDICATION Take 1 capsule by mouth daily. Flamex Rx    [provider]  OVER THE COUNTER MEDICATION Take 1 capsule by mouth daily. Ultra Flora Probiotic    [provider]  pregabalin (LYRICA) 25 MG capsule Take 1 capsule (25 mg total) by mouth 2 (two) times daily. Patient taking differently: Take 25 mg by mouth 2 (two) times daily as needed (pain).  11/23/19   [name], MD  prochlorperazine (COMPAZINE) 10 MG tablet Take 1 tablet (10 mg total) by mouth every 6 (six) hours as needed for nausea or vomiting. 10/23/19   Curt Bears, MD  traMADol (ULTRAM) 50 MG tablet Take 1 tablet (50 mg total) by mouth every 6 (six) hours as needed. 12/06/19 12/05/20  Lenn Cal, DDS  Zinc 30 MG TABS Take 30 mg by mouth daily.    [provider]     Allergies    Advil [ibuprofen], Iron, Penicillins, and Tetracyclines & related   Review of Systems   Review of Systems  Constitutional: Negative for fever.  HENT: Negative for congestion and sore throat.   Respiratory: Negative for cough and shortness of breath.   Cardiovascular: Negative for chest pain.  Gastrointestinal: Positive for abdominal pain, constipation and nausea. Negative for diarrhea and vomiting.  Genitourinary: Negative for dysuria.  Musculoskeletal: Negative for myalgias.  Skin: Negative for rash.  Neurological: Negative for headaches.  Psychiatric/Behavioral: Negative for behavioral problems.     Physical Exam BP (!) 133/108 (BP Location: Right Arm)   Pulse 84   Temp 97.9 F (36.6 C) (Oral)   Resp 14   Ht 5' 4.5" (1.638 m)   Wt 94.3 kg   LMP 01/17/1994   SpO2 98%   BMI 35.15 kg/m   Physical  Exam Constitutional:      Appearance: Normal appearance.  HENT:     Head: Normocephalic and atraumatic.     Nose: Nose normal.     Mouth/Throat:     Mouth: Mucous membranes are moist.  Eyes:     Extraocular Movements: Extraocular movements intact.     Conjunctiva/sclera: Conjunctivae normal.  Cardiovascular:     Rate and Rhythm: Normal rate.  Pulmonary:     Effort: Pulmonary effort is normal.  Breath sounds: Normal breath sounds.  Abdominal:     General: Abdomen is flat.     Palpations: Abdomen is soft.     Tenderness: There is no abdominal tenderness.  Musculoskeletal:        General: No swelling. Normal range of motion.     Cervical back: Neck supple.  Skin:    General: Skin is warm and dry.  Neurological:     General: No focal deficit present.     Mental Status: She is alert.  Psychiatric:        Mood and Affect: Mood normal.      ED Results / Procedures / Treatments   Labs (all labs ordered are listed, but only abnormal results are displayed) Labs Reviewed  COMPREHENSIVE METABOLIC PANEL - Abnormal; Notable for the following components:      Result Value   Potassium 3.4 (*)    Glucose, Bld 107 (*)    Total Protein 6.0 (*)    Albumin 3.1 (*)    All other components within normal limits  URINALYSIS, ROUTINE W REFLEX MICROSCOPIC - Abnormal; Notable for the following components:   Color, Urine STRAW (*)    Specific Gravity, Urine 1.004 (*)    All other components within normal limits  CBC WITH DIFFERENTIAL/PLATELET    EKG None  Radiology No results found.  Procedures Procedures  Medications Ordered in the ED Medications - No data to display   ED Course  I have reviewed the triage vital signs and the nursing notes.  Pertinent labs & imaging results that were available during my care of the patient were reviewed by me and considered in my medical decision making (see chart for details).  Clinical Course as of Dec 21 1849  Sat Dec 22, 2019  1640  Patient here with constipation and what sounds like possible fecal impaction, but will defer rectal exam until labs can be checked to ensure she is not neutropenic from recent chemo.    [CS]  1809 CBC shows normal WBC. Rectal exam: patient with moderate hard stool in rectal vault. Attempted to break up stool with digital exam. Normal brown color. Patient will attempt bowel movement now.    [CS]  [id number] Patient has had a large bowel movement and feels much better. She was given advise on bowel regimen at home. Recommend Colace, senna and Mag Citrate if she becomes constipated again. She states she does not tolerate Miralax. Onc followup as scheduled.    [CS]    Clinical Course User Index [CS] Truddie Hidden, MD    MDM Rules/Calculators/A&P MDM  Final Clinical Impression(s) / ED Diagnoses Final diagnoses:  Fecal impaction in rectum [facility])    Rx / DC Orders ED Discharge Orders    None       Truddie Hidden, MD 12/22/19 1851

## 2019-12-22 NOTE — ED Triage Notes (Addendum)
C/o constipation since Tuesday with rectal pain.  Pt had 1st chemo treatment on Tuesday.

## 2019-12-23 NOTE — Progress Notes (Signed)
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  PRIOR THERAPY: Status post left lower lobectomy with lymph node sampling on October 01, 2019 under the care of Dr. Kipp Brood.  CURRENT THERAPY: Adjuvant systemic chemotherapy with cisplatin 75 mg/M2 and Alimta 500 mg/M2 every 3 weeks.  First dose expected April 6th, 2021.   INTERVAL HISTORY: Sarah Lee 69 y.o. female returns to the clinic for a follow up visit. The patient completed her first cycle of adjuvant chemotherapy. During her first infusion. She developed a headache and muscle spasms from her back that radiated to the abdomen following her premedications. She was given tylenol. She then developed eye fluttering and hot flashes near the end of her infusion with Alimta. She was given pepcid and completed her infusion without any other concerns.   She continued to have mild headaches upon returning home. Was advised to use tylenol with relief. Of note, her initial staging brain MRI was negative for metastatic disease to the brain. Following treatment she reports fatigue/decreased energy. She also states she felt palpitations occasionally, especially when she was laying down at night. She is also reporting muscle spasms in the lower part of her abdomen.   She developed constipation/nausea and was seen in the emergency room on 4/10. She had a manual disimpaction and achieved large bowel movement following this with significant improvement in her symptoms. She was given constipation education while in the emergency room. She had some associated hemorrhoidal bleeding with straining the last few days. She is also reporting numbness around the rectum. Her las BM was two days ago. Today, she  is also reporting foaming urine since receiving chemotherapy last week. Of note, she also had a UA while in the ER which was unremarkable.   She has chronic hypogastric/RLQ pain that comes and goes. She has been evaluated for this several times in the past. She had been seen in the ER in October 2020 for this concern. No etiology of her pain has been uncovered but her lung malignancy was incidentally discovered at that time. Her staging PET scan did not show any activity in this region. She had multiple abdominal surgeries including a hysterectomy and her pain is thought to be due to adhesions. Her pain has been more constant since receiving chemotherapy; however, she had also been constipated for several days following chemotherapy which could be contributing to her pain.   Today, the patient is feeling fairly well. She notes that she is often very hungry. She is in need of dental surgery which she reports makes it challenging to eat. Dr. Julien Nordmann advised her previously that he would like to wait until her treatment is completed before having extensive oral surgery due to the risk of infection. She denies any fevers, chills, or night sweats. She lost 4 pounds since 3/30. She denies any shortness of breath, cough, or hemoptysis but reports some continued left rib pain from her lobectomy. She denies any vomiting or diarrhea. She had nausea associated with her constipation. She is here for evaluation, weekly labs, and a one week follow up visit after completing her first cycle of chemotherapy.    MEDICAL HISTORY: Past Medical History:  Diagnosis Date  . Allergy   . Anemia   . Arthritis   .  Asthma    as a child  . Blood transfusion   . Cancer ([place])    lung cancer  . Cataract    slight  . Complication of anesthesia    used sodium pentothal and very hard to wake up and arm swelled up from medication  . Constipation   . Gallstones   . GERD (gastroesophageal reflux disease)   . Heart murmur    no  issues  . Pneumonia   . Seasonal allergies     ALLERGIES:  is allergic to advil [ibuprofen]; iron; penicillins; and tetracyclines & related.  MEDICATIONS:  Current Outpatient Medications  Medication Sig Dispense Refill  . Acetylcysteine (N-ACETYL-L-CYSTEINE PO) Take 1 tablet by mouth daily.     . Aromatic Inhalants (VICKS VAPOR IN) Place 1 spray into the nose as needed (congestion.).     Marland Kitchen BLACK ELDERBERRY PO Take 2 capsules by mouth daily.    . Cholecalciferol (VITAMIN D-3) 125 MCG (5000 UT) TABS Take 10,000 Units by mouth daily.    Marland Kitchen dexamethasone (DECADRON) 4 MG tablet 1 tablet p.o. twice daily the day before, day of and day after chemotherapy every 3 weeks 30 tablet 0  . folic acid (FOLVITE) 1 MG tablet Take 1 tablet (1 mg total) by mouth daily. 30 tablet 4  . Magnesium 200 MG TABS Take 200 mg by mouth daily.     . mupirocin ointment (BACTROBAN) 2 % Apply 1 application topically 2 (two) times daily. 30 g 0  . OVER THE COUNTER MEDICATION Take 1 capsule by mouth daily. Flamex Rx    . OVER THE COUNTER MEDICATION Take 1 capsule by mouth daily. Ultra Flora Probiotic    . pregabalin (LYRICA) 25 MG capsule Take 1 capsule (25 mg total) by mouth 2 (two) times daily. (Patient taking differently: Take 25 mg by mouth 2 (two) times daily as needed (pain). ) 60 capsule 0  . prochlorperazine (COMPAZINE) 10 MG tablet Take 1 tablet (10 mg total) by mouth every 6 (six) hours as needed for nausea or vomiting. 30 tablet 0  . traMADol (ULTRAM) 50 MG tablet Take 1 tablet (50 mg total) by mouth every 6 (six) hours as needed. 20 tablet 0  . Zinc 30 MG TABS Take 30 mg by mouth daily.     No current facility-administered medications for this visit.    SURGICAL HISTORY:  Past Surgical History:  Procedure Laterality Date  . ABDOMINAL HYSTERECTOMY  2004   fibroids--- TAH/BSO  . BREAST SURGERY     right breast biopsy-benign  . CESAREAN SECTION    . CHOLECYSTECTOMY N/A 10/28/2016   Procedure: LAPAROSCOPIC  CHOLECYSTECTOMY WITH INTRAOPERATIVE CHOLANGIOGRAM;  Surgeon: Jackolyn Confer, MD;  Location: [facility];  Service: General;  Laterality: N/A;  . COLONOSCOPY    . ERCP N/A 10/29/2016   Procedure: ENDOSCOPIC RETROGRADE CHOLANGIOPANCREATOGRAPHY (ERCP);  Surgeon: Doran Stabler, MD;  Location: [facility];  Service: Endoscopy;  Laterality: N/A;  . MULTIPLE EXTRACTIONS WITH ALVEOLOPLASTY Bilateral 12/06/2019   Procedure: Extraction of tooth #'s 4, and 7-13 with alveoloplasty  and bilateral maxillary  tuberosity reductions;  Surgeon: Lenn Cal, DDS;  Location: [facility];  Service: Oral Surgery;  Laterality: Bilateral;  . NODE DISSECTION Left 10/01/2019   Procedure: Node Dissection;  Surgeon: [name], MD;  Location: [facility];  Service: Thoracic;  Laterality: Left;  . POLYPECTOMY    . UTERINE FIBROID SURGERY    . VIDEO BRONCHOSCOPY N/A 10/01/2019   Procedure: VIDEO BRONCHOSCOPY;  Surgeon: [name], MD;  Location: [facility];  Service: Thoracic;  Laterality: N/A;    REVIEW OF SYSTEMS:   Review of Systems  Constitutional: Positive for fatigue, appetite change, and weight loss. Negative for chills and fever.  HENT: Positive for chronic apical periodontitis. Negative for mouth sores, nosebleeds, sore throat.  Eyes: Negative for eye problems and icterus.  Respiratory: Negative for cough, hemoptysis, shortness of breath and wheezing.   Cardiovascular: Positive for occasional left rib pain secondary to lobectomy. Negative for leg swelling.  Gastrointestinal: Positive for frequent constipation/hypogastric/RLW abdominal pain, and nausea. Negative for diarrhea and vomiting.  Genitourinary: Positive for foamy urine and rectal numbness. Negative for bladder incontinence, difficulty urinating, dysuria, frequency and hematuria.   Musculoskeletal: Positive for back pain. gait problem, neck pain and neck stiffness.  Skin: Negative for itching and rash.  Neurological: Positive for headaches  following treatment. Negative for dizziness, extremity weakness, gait problem, light-headedness and seizures.  Hematological: Negative for adenopathy. Does not bruise/bleed easily.  Psychiatric/Behavioral: Negative for confusion, depression and sleep disturbance. The patient is not nervous/anxious.     PHYSICAL EXAMINATION:  Blood pressure 123/71, pulse 83, temperature 97.8 F (36.6 C), temperature source Oral, resp. rate 18, height 5' 4.5" (1.638 m), weight 206 lb 3.2 oz (93.5 kg), last menstrual period 01/17/1994, SpO2 100 %.  ECOG PERFORMANCE STATUS: 1 - Symptomatic but completely ambulatory  Physical Exam  Constitutional: Oriented to person, place, and time and well-developed, well-nourished, and in no distress.  HENT:  Head: Normocephalic and atraumatic.  Mouth/Throat: Oropharynx is clear and moist. No oropharyngeal exudate.  Eyes: Conjunctivae are normal. Right eye exhibits no discharge. Left eye exhibits no discharge. No scleral icterus.  Neck: Normal range of motion. Neck supple.  Cardiovascular: Normal rate, regular rhythm, normal heart sounds and intact distal pulses.   Pulmonary/Chest: Effort normal and breath sounds normal. No respiratory distress. No wheezes. No rales.  Abdominal: Soft. Mild epigastric tenderness. Bowel sounds are normal. Exhibits no distension and no mass. Negative Rovsing's sign. No rebound tenderness.  Musculoskeletal: Normal range of motion. Exhibits no edema.  Lymphadenopathy:    No cervical adenopathy.  Neurological: Alert and oriented to person, place, and time. Exhibits normal muscle tone. Gait normal. Coordination normal.  Skin: Skin is warm and dry. No rash noted. Not diaphoretic. No erythema. No pallor.  Psychiatric: Mood, memory and judgment normal.  Vitals reviewed.  LABORATORY DATA: Lab Results  Component Value Date   WBC 6.4 12/25/2019   HGB 12.4 12/25/2019   HCT 37.8 12/25/2019   MCV 89.8 12/25/2019   PLT 145 (L) 12/25/2019       Chemistry      Component Value Date/Time   NA 138 12/25/2019 0858   K 3.8 12/25/2019 0858   CL 99 12/25/2019 0858   CO2 28 12/25/2019 0858   BUN 25 (H) 12/25/2019 0858   CREATININE 1.36 (H) 12/25/2019 0858      Component Value Date/Time   CALCIUM 9.5 12/25/2019 0858   ALKPHOS 72 12/25/2019 0858   AST 20 12/25/2019 0858   ALT 24 12/25/2019 0858   BILITOT 0.6 12/25/2019 0858       RADIOGRAPHIC STUDIES:  No results found.   ASSESSMENT/PLAN:  This is a very pleasant 69 year old African American female with stage IIA non-small cell lung cancer, adenosquamous carcinoma. She presented with a left lower lobe lung lesions. She does not have any actionable mutations. She was diagnosed in January 2021.   She is status  post a left lower lobectomy with lymph node dissection on January 18,2021 under the care of Dr. Kipp Brood.   She is currently undergoing adjuvant chemotherapy with cisplatin 75 mg/m2 and Alimta 400 mg/m2 IV every 3 weeks. She is status post 1 cycle.   The patient was seen with Dr. Julien Nordmann. Labs were reviewed. Recommend that she continue on the same treatment at the same dose.   We will see her back for a follow up visit in 2 weeks for evaluation before starting cycle #2  Dr. Julien Nordmann advised her to wait until she completes treatment to undergo dental surgery.   Advised to continue to use tylenol as needed for headache or abdominal discomfort.   Reiterated constipation education with the patient as previously discussed in the ER. Advised to use stool softeners to prevent constipation. Also encouraged to drink plenty of fluid, be as active as possible, and to eat fruits/veggies to reduce the risk of constipation. If she has not had a bowel movement for a couple days despite these interventions, advised her to use one of the over the counter laxatives.   Regarding her dental work/appetite. She states she often feels very hungry. Advised her to eat small frequent meals.  Provided the patient a hand out of soft/liquid diet due to having several teeth pulled. She also would appreciate evaluation by the nutritionist. I have placed a referral.   Her creatinine is elevated today compared to her baseline. We will continue to monitor on weekly labs. Advised her to drink plenty of water while undergoing treatment with Cisplatin, which can be nephrotoxic.   The patient was advised to call immediately if she has any concerning symptoms in the interval. The patient voices understanding of current disease status and treatment options and is in agreement with the current care plan. All questions were answered. The patient knows to call the clinic with any problems, questions or concerns. We can certainly see the patient much sooner if necessary    No orders of the defined types were placed in this encounter.    Meng Winterton L Naylene Foell, PA-C 12/25/19  ADDENDUM: Hematology/Oncology Attending: I had a face-to-face encounter with the patient today.  I recommended her care plan.  This is a very pleasant 69 years old African-American female recently diagnosed with stage IIa non-small cell lung cancer, adenocarcinoma.  She is status post left lower lobectomy with lymph node dissection in January 2021.  The patient is currently undergoing adjuvant systemic chemotherapy with cisplatin and Alimta status post 1 cycle.  The first cycle of her treatment was given last week.  She has a lot of issues after the first cycle including some fatigue as well as numbness and tingling's in the perineal area.  She also recovering from recent dental work. She has lack of appetite and poor hydration.  I strongly encouraged the patient to increase her hydration.  We noticed some increase in her serum creatinine on the blood work performed earlier today. We will continue to monitor her closely with weekly blood work.  She will come back for follow-up visit in 2 weeks for evaluation before starting  cycle #2. The patient was advised to call immediately if she has any concerning symptoms in the interval.  Disclaimer: This note was dictated with voice recognition software. Similar sounding words can inadvertently be transcribed and may be missed upon review. Sarah Kempf, MD 12/25/19

## 2019-12-24 ENCOUNTER — Encounter: Payer: Self-pay | Admitting: *Deleted

## 2019-12-24 ENCOUNTER — Telehealth: Payer: Self-pay | Admitting: *Deleted

## 2019-12-24 NOTE — Telephone Encounter (Signed)
Oncology Nurse Navigator Documentation  Oncology Nurse Navigator Flowsheets 12/24/2019  Abnormal Finding Date -  Confirmed Diagnosis Date -  Diagnosis Status -  Planned Course of Treatment -  Phase of Treatment -  Chemotherapy Actual Start Date: -  Navigator Follow Up Date: -  Navigator Follow Up Reason: -  Navigator Location [id number]  Navigator Encounter Type Telephone/patient called and left vm message. I called her back. She would like a letter from Dr. Julien Nordmann that she can get dental work done.  I will follow up with Dr. Julien Nordmann to see if patient is able to get dental work.  She would like to have CSW contact her again for support. I will update them.   Telephone Incoming Call;Outgoing Call  Patient Visit Type -  Treatment Phase Treatment  Barriers/Navigation Needs Education  Education Other  Interventions Education  Acuity Level 2-Minimal Needs (1-2 Barriers Identified)  Coordination of Care Other  Education Method Verbal  Time Spent with Patient 15

## 2019-12-24 NOTE — Progress Notes (Signed)
Patient would like a note from Dr. Julien Nordmann regarding her being able to get dental work.  I spoke with Dr. Julien Nordmann and he would like her to wait until the 9 weeks of treatment are over.  I did update him that she is unable to eat much.  He states I could get a dietitian referral to speak with her to help.  I called patient back.  She states she is hungry.  I asked what she had for breakfast.  She stated a few eggs, toast, and drink.  I listened as she explained.  Patient is coming to see PA tomorrow and will be able to have more discussion.  Will hold off on dietitian referral for now and patient agreed.

## 2019-12-25 ENCOUNTER — Ambulatory Visit: Payer: BC Managed Care – PPO | Admitting: Physician Assistant

## 2019-12-25 ENCOUNTER — Other Ambulatory Visit: Payer: Self-pay

## 2019-12-25 ENCOUNTER — Encounter: Payer: Self-pay | Admitting: Physician Assistant

## 2019-12-25 ENCOUNTER — Telehealth: Payer: Self-pay | Admitting: Medical Oncology

## 2019-12-25 ENCOUNTER — Other Ambulatory Visit: Payer: BC Managed Care – PPO

## 2019-12-25 ENCOUNTER — Inpatient Hospital Stay: Payer: BC Managed Care – PPO

## 2019-12-25 ENCOUNTER — Ambulatory Visit: Payer: BC Managed Care – PPO

## 2019-12-25 ENCOUNTER — Inpatient Hospital Stay (HOSPITAL_BASED_OUTPATIENT_CLINIC_OR_DEPARTMENT_OTHER): Payer: BC Managed Care – PPO | Admitting: Physician Assistant

## 2019-12-25 VITALS — BP 123/71 | HR 83 | Temp 97.8°F | Resp 18 | Ht 64.5 in | Wt 206.2 lb

## 2019-12-25 DIAGNOSIS — C3492 Malignant neoplasm of unspecified part of left bronchus or lung: Secondary | ICD-10-CM

## 2019-12-25 DIAGNOSIS — Z5111 Encounter for antineoplastic chemotherapy: Secondary | ICD-10-CM | POA: Diagnosis not present

## 2019-12-25 DIAGNOSIS — K5900 Constipation, unspecified: Secondary | ICD-10-CM

## 2019-12-25 LAB — CMP (CANCER CENTER ONLY)
ALT: 24 U/L (ref 0–44)
AST: 20 U/L (ref 15–41)
Albumin: 3.4 g/dL — ABNORMAL LOW (ref 3.5–5.0)
Alkaline Phosphatase: 72 U/L (ref 38–126)
Anion gap: 11 (ref 5–15)
BUN: 25 mg/dL — ABNORMAL HIGH (ref 8–23)
CO2: 28 mmol/L (ref 22–32)
Calcium: 9.5 mg/dL (ref 8.9–10.3)
Chloride: 99 mmol/L (ref 98–111)
Creatinine: 1.36 mg/dL — ABNORMAL HIGH (ref 0.44–1.00)
GFR, Est AFR Am: 46 mL/min — ABNORMAL LOW (ref >60)
GFR, Estimated: 40 mL/min — ABNORMAL LOW (ref >60)
Glucose, Bld: 91 mg/dL (ref 70–99)
Potassium: 3.8 mmol/L (ref 3.5–5.1)
Sodium: 138 mmol/L (ref 135–145)
Total Bilirubin: 0.6 mg/dL (ref 0.3–1.2)
Total Protein: 6.8 g/dL (ref 6.5–8.1)

## 2019-12-25 LAB — CBC WITH DIFFERENTIAL (CANCER CENTER ONLY)
Abs Immature Granulocytes: 0.02 10*3/uL (ref 0.00–0.07)
Basophils Absolute: 0 10*3/uL (ref 0.0–0.1)
Basophils Relative: 0 %
Eosinophils Absolute: 0.1 10*3/uL (ref 0.0–0.5)
Eosinophils Relative: 1 %
HCT: 37.8 % (ref 36.0–46.0)
Hemoglobin: 12.4 g/dL (ref 12.0–15.0)
Immature Granulocytes: 0 %
Lymphocytes Relative: 21 %
Lymphs Abs: 1.4 10*3/uL (ref 0.7–4.0)
MCH: 29.5 pg (ref 26.0–34.0)
MCHC: 32.8 g/dL (ref 30.0–36.0)
MCV: 89.8 fL (ref 80.0–100.0)
Monocytes Absolute: 0.1 10*3/uL (ref 0.1–1.0)
Monocytes Relative: 2 %
Neutro Abs: 4.8 10*3/uL (ref 1.7–7.7)
Neutrophils Relative %: 76 %
Platelet Count: 145 10*3/uL — ABNORMAL LOW (ref 150–400)
RBC: 4.21 MIL/uL (ref 3.87–5.11)
RDW: 12 % (ref 11.5–15.5)
WBC Count: 6.4 10*3/uL (ref 4.0–10.5)
nRBC: 0 % (ref 0.0–0.2)

## 2019-12-25 LAB — MAGNESIUM: Magnesium: 1.7 mg/dL (ref 1.7–2.4)

## 2019-12-25 NOTE — Patient Instructions (Signed)
It is common for patients who are undergoing treatment and taking certain prescribed medications to experience side-effects with constipation.  If you experience constipation, please take stool softener such as Colace or Senna one tablet twice a day everyday to avoid constipation.  These medications are available over the counter.  Of course, if you have diarrhea, stop taking stool softeners.  Drinking plenty of fluid, eating fruits and vegetable, and being active also reduces the risk of constipation.   If despite taking stool softeners, and you still have no bowel movement for 2 days or more than your normal bowel habit frequency, please take one of the following over the counter laxatives:  MiraLax, Milk of Magnesia or Mag Citrate everyday. The goal is to have at least one bowel movement every other day.     Soft-Food Eating Plan A soft-food eating plan includes foods that are safe and easy to chew and swallow. Your health care provider or dietitian can help you find foods and flavors that fit into this plan. Follow this plan until your health care provider or dietitian says it is safe to start eating other foods and food textures. What are tips for following this plan? General guidelines   Take small bites of food, or cut food into pieces about  inch or smaller. Bite-sized pieces of food are easier to chew and swallow.  Eat moist foods. Avoid overly dry foods.  Avoid foods that: ? Are difficult to swallow, such as dry, chunky, crispy, or sticky foods. ? Are difficult to chew, such as hard, tough, or stringy foods. ? Contain nuts, seeds, or fruits.  Follow instructions from your dietitian about the types of liquids that are safe for you to swallow. You may be allowed to have: ? Thick liquids only. This includes only liquids that are thicker than honey. ? Thin and thick liquids. This includes all beverages and foods that become liquid at room temperature.  To make thick  liquids: ? Purchase a commercial liquid thickening powder. These are available at grocery stores and pharmacies. ? Mix the thickener into liquids according to instructions on the label. ? Purchase ready-made thickened liquids. ? Thicken soup by pureeing, straining to remove chunks, and adding flour, potato flakes, or corn starch. ? Add commercial thickener to foods that become liquid at room temperature, such as milk shakes, yogurt, ice cream, gelatin, and sherbet.  Ask your health care provider whether you need to take a fiber supplement. Cooking  Cook meats so they stay tender and moist. Use methods like braising, stewing, or baking in liquid.  Cook vegetables and fruit until they are soft enough to be mashed with a fork.  Peel soft, fresh fruits such as peaches, nectarines, and melons.  When making soup, make sure chunks of meat and vegetables are smaller than  inch.  Reheat leftover foods slowly so that a tough crust does not form. What foods are allowed? The items listed below may not be a complete list. Talk with your dietitian about what dietary choices are best for you. Grains Breads, muffins, pancakes, or waffles moistened with syrup, jelly, or butter. Dry cereals well-moistened with milk. Moist, cooked cereals. Well-cooked pasta and rice. Vegetables All soft-cooked vegetables. Shredded lettuce. Fruits All canned and cooked fruits. Soft, peeled fresh fruits. Strawberries. Dairy Milk. Cream. Yogurt. Cottage cheese. Soft cheese without the rind. Meats and other protein foods Tender, moist ground meat, poultry, or fish. Meat cooked in gravy or sauces. Eggs. Sweets and desserts Ice cream. Milk  shakes. Sherbet. Pudding. Fats and oils Butter. Margarine. Olive, canola, sunflower, and grapeseed oil. Smooth salad dressing. Smooth cream cheese. Mayonnaise. Gravy. What foods are not allowed? The items listed bemay not be a complete list. Talk with your dietitian about what dietary  choices are best for you. Grains Coarse or dry cereals, such as bran, granola, and shredded wheat. Tough or chewy crusty breads, such as [place] bread or baguettes. Breads with nuts, seeds, or fruit. Vegetables All raw vegetables. Cooked corn. Cooked vegetables that are tough or stringy. Tough, crisp, fried potatoes and potato skins. Fruits Fresh fruits with skins or seeds, or both, such as apples, pears, and grapes. Stringy, high-pulp fruits, such as papaya, pineapple, coconut, and mango. Fruit leather and all dried fruit. Dairy Yogurt with nuts or coconut. Meats and other protein foods Hard, dry sausages. Dry meat, poultry, or fish. Meats with gristle. Fish with bones. Fried meat or fish. Lunch meat and hotdogs. Nuts and seeds. Chunky peanut butter or other nut butters. Sweets and desserts Cakes or cookies that are very dry or chewy. Desserts with dried fruit, nuts, or coconut. Fried pastries. Very rich pastries. Fats and oils Cream cheese with fruit or nuts. Salad dressings with seeds or chunks. Summary  A soft-food eating plan includes foods that are safe and easy to swallow. Generally, the foods should be soft enough to be mashed with a fork.  Avoid foods that are dry, hard to chew, crunchy, sticky, stringy, or crispy.  Ask your health care provider whether you need to thicken your liquids and if you need to take a fiber supplement. This information is not intended to replace advice given to you by your health care provider. Make sure you discuss any questions you have with your health care provider. Document Revised: 12/21/2018 Document Reviewed: 11/02/2016 Elsevier Patient Education  [place].

## 2019-12-25 NOTE — Telephone Encounter (Signed)
Constipation- seeing Cassie today.

## 2019-12-31 MED FILL — Fosaprepitant Dimeglumine For IV Infusion 150 MG (Base Eq): INTRAVENOUS | Qty: 5 | Status: AC

## 2019-12-31 MED FILL — Dexamethasone Sodium Phosphate Inj 100 MG/10ML: INTRAMUSCULAR | Qty: 1 | Status: AC

## 2020-01-01 ENCOUNTER — Ambulatory Visit: Payer: BC Managed Care – PPO

## 2020-01-01 ENCOUNTER — Other Ambulatory Visit: Payer: BC Managed Care – PPO

## 2020-01-01 ENCOUNTER — Other Ambulatory Visit: Payer: Self-pay

## 2020-01-01 ENCOUNTER — Inpatient Hospital Stay: Payer: BC Managed Care – PPO

## 2020-01-01 ENCOUNTER — Ambulatory Visit: Payer: BC Managed Care – PPO | Admitting: Physician Assistant

## 2020-01-01 DIAGNOSIS — Z5111 Encounter for antineoplastic chemotherapy: Secondary | ICD-10-CM | POA: Diagnosis not present

## 2020-01-01 DIAGNOSIS — C3492 Malignant neoplasm of unspecified part of left bronchus or lung: Secondary | ICD-10-CM

## 2020-01-01 LAB — CBC WITH DIFFERENTIAL (CANCER CENTER ONLY)
Abs Immature Granulocytes: 0 10*3/uL (ref 0.00–0.07)
Basophils Absolute: 0 10*3/uL (ref 0.0–0.1)
Basophils Relative: 0 %
Eosinophils Absolute: 0.2 10*3/uL (ref 0.0–0.5)
Eosinophils Relative: 7 %
HCT: 34.2 % — ABNORMAL LOW (ref 36.0–46.0)
Hemoglobin: 11.3 g/dL — ABNORMAL LOW (ref 12.0–15.0)
Immature Granulocytes: 0 %
Lymphocytes Relative: 39 %
Lymphs Abs: 1.1 10*3/uL (ref 0.7–4.0)
MCH: 29.5 pg (ref 26.0–34.0)
MCHC: 33 g/dL (ref 30.0–36.0)
MCV: 89.3 fL (ref 80.0–100.0)
Monocytes Absolute: 0.4 10*3/uL (ref 0.1–1.0)
Monocytes Relative: 13 %
Neutro Abs: 1.1 10*3/uL — ABNORMAL LOW (ref 1.7–7.7)
Neutrophils Relative %: 41 %
Platelet Count: 166 10*3/uL (ref 150–400)
RBC: 3.83 MIL/uL — ABNORMAL LOW (ref 3.87–5.11)
RDW: 12.3 % (ref 11.5–15.5)
WBC Count: 2.7 10*3/uL — ABNORMAL LOW (ref 4.0–10.5)
nRBC: 0 % (ref 0.0–0.2)

## 2020-01-01 LAB — CMP (CANCER CENTER ONLY)
ALT: 23 U/L (ref 0–44)
AST: 19 U/L (ref 15–41)
Albumin: 3.3 g/dL — ABNORMAL LOW (ref 3.5–5.0)
Alkaline Phosphatase: 71 U/L (ref 38–126)
Anion gap: 11 (ref 5–15)
BUN: 18 mg/dL (ref 8–23)
CO2: 25 mmol/L (ref 22–32)
Calcium: 9.3 mg/dL (ref 8.9–10.3)
Chloride: 107 mmol/L (ref 98–111)
Creatinine: 1.21 mg/dL — ABNORMAL HIGH (ref 0.44–1.00)
GFR, Est AFR Am: 53 mL/min — ABNORMAL LOW (ref >60)
GFR, Estimated: 46 mL/min — ABNORMAL LOW (ref >60)
Glucose, Bld: 90 mg/dL (ref 70–99)
Potassium: 4.2 mmol/L (ref 3.5–5.1)
Sodium: 143 mmol/L (ref 135–145)
Total Bilirubin: 0.5 mg/dL (ref 0.3–1.2)
Total Protein: 6.4 g/dL — ABNORMAL LOW (ref 6.5–8.1)

## 2020-01-01 LAB — MAGNESIUM: Magnesium: 1.9 mg/dL (ref 1.7–2.4)

## 2020-01-03 NOTE — Progress Notes (Signed)
Pharmacist Chemotherapy Monitoring - Follow Up Assessment    I verify that I have reviewed each item in the below checklist:  . Regimen for the patient is scheduled for the appropriate day and plan matches scheduled date. Marland Kitchen Appropriate non-routine labs are ordered dependent on drug ordered. . If applicable, additional medications reviewed and ordered per protocol based on lifetime cumulative doses and/or treatment regimen.   Plan for follow-up and/or issues identified: No . I-vent associated with next due treatment: No . MD and/or nursing notified: No  .   Hameed Kolar D 01/03/2020 3:46 PM

## 2020-01-08 ENCOUNTER — Telehealth: Payer: Self-pay | Admitting: Medical Oncology

## 2020-01-08 MED FILL — Fosaprepitant Dimeglumine For IV Infusion 150 MG (Base Eq): INTRAVENOUS | Qty: 5 | Status: AC

## 2020-01-08 MED FILL — Dexamethasone Sodium Phosphate Inj 100 MG/10ML: INTRAMUSCULAR | Qty: 1 | Status: AC

## 2020-01-08 NOTE — Telephone Encounter (Signed)
Pt cancelled appt tomorrow due to "congestion".  I called back and LVM encouraging her to keep her lab and f/u appt to be evaluated. I told her she has a tx scheduled, but that does not mean she will get treated.

## 2020-01-09 ENCOUNTER — Inpatient Hospital Stay: Payer: BC Managed Care – PPO | Admitting: Nutrition

## 2020-01-09 ENCOUNTER — Inpatient Hospital Stay: Payer: BC Managed Care – PPO | Admitting: Internal Medicine

## 2020-01-09 ENCOUNTER — Inpatient Hospital Stay: Payer: BC Managed Care – PPO

## 2020-01-14 ENCOUNTER — Encounter: Payer: Self-pay | Admitting: *Deleted

## 2020-01-14 NOTE — Progress Notes (Signed)
Oncology Nurse Navigator Documentation  Oncology Nurse Navigator Flowsheets 01/14/2020  Abnormal Finding Date -  Confirmed Diagnosis Date -  Diagnosis Status -  Planned Course of Treatment -  Phase of Treatment -  Chemotherapy Actual Start Date: -  Navigator Follow Up Date: -  Navigator Follow Up Reason: -  Navigation Complete Date: -  Post Navigation: Continue to Follow Patient? 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 She does not want treatment anymore.  She is coming to get her labs tomorrow but doesn't want treatment.  I updated Dr. Julien Nordmann.   Telephone Incoming Call  Patient Visit Type -  Treatment Phase Treatment  Barriers/Navigation Needs Coordination of Care;Education  Education Other  Interventions Coordination of Care;Education  Acuity Level 2-Minimal Needs (1-2 Barriers Identified)  Coordination of Care Other  Education Method Verbal  Support Groups/Services -  Time Spent with Patient 30

## 2020-01-15 ENCOUNTER — Other Ambulatory Visit: Payer: Self-pay

## 2020-01-15 ENCOUNTER — Inpatient Hospital Stay: Payer: BC Managed Care – PPO | Attending: Internal Medicine

## 2020-01-15 DIAGNOSIS — C3432 Malignant neoplasm of lower lobe, left bronchus or lung: Secondary | ICD-10-CM | POA: Insufficient documentation

## 2020-01-15 DIAGNOSIS — C3492 Malignant neoplasm of unspecified part of left bronchus or lung: Secondary | ICD-10-CM

## 2020-01-15 LAB — CBC WITH DIFFERENTIAL (CANCER CENTER ONLY)
Abs Immature Granulocytes: 0.02 10*3/uL (ref 0.00–0.07)
Basophils Absolute: 0 10*3/uL (ref 0.0–0.1)
Basophils Relative: 0 %
Eosinophils Absolute: 0 10*3/uL (ref 0.0–0.5)
Eosinophils Relative: 0 %
HCT: 34.1 % — ABNORMAL LOW (ref 36.0–46.0)
Hemoglobin: 10.9 g/dL — ABNORMAL LOW (ref 12.0–15.0)
Immature Granulocytes: 0 %
Lymphocytes Relative: 26 %
Lymphs Abs: 1.5 10*3/uL (ref 0.7–4.0)
MCH: 29.1 pg (ref 26.0–34.0)
MCHC: 32 g/dL (ref 30.0–36.0)
MCV: 91.2 fL (ref 80.0–100.0)
Monocytes Absolute: 0.7 10*3/uL (ref 0.1–1.0)
Monocytes Relative: 12 %
Neutro Abs: 3.6 10*3/uL (ref 1.7–7.7)
Neutrophils Relative %: 62 %
Platelet Count: 231 10*3/uL (ref 150–400)
RBC: 3.74 MIL/uL — ABNORMAL LOW (ref 3.87–5.11)
RDW: 12.9 % (ref 11.5–15.5)
WBC Count: 5.9 10*3/uL (ref 4.0–10.5)
nRBC: 0 % (ref 0.0–0.2)

## 2020-01-15 LAB — CMP (CANCER CENTER ONLY)
ALT: 23 U/L (ref 0–44)
AST: 24 U/L (ref 15–41)
Albumin: 3.5 g/dL (ref 3.5–5.0)
Alkaline Phosphatase: 77 U/L (ref 38–126)
Anion gap: 7 (ref 5–15)
BUN: 15 mg/dL (ref 8–23)
CO2: 27 mmol/L (ref 22–32)
Calcium: 9.3 mg/dL (ref 8.9–10.3)
Chloride: 107 mmol/L (ref 98–111)
Creatinine: 1.04 mg/dL — ABNORMAL HIGH (ref 0.44–1.00)
GFR, Est AFR Am: >60 mL/min (ref >60)
GFR, Estimated: 55 mL/min — ABNORMAL LOW (ref >60)
Glucose, Bld: 93 mg/dL (ref 70–99)
Potassium: 3.8 mmol/L (ref 3.5–5.1)
Sodium: 141 mmol/L (ref 135–145)
Total Bilirubin: 0.4 mg/dL (ref 0.3–1.2)
Total Protein: 6.7 g/dL (ref 6.5–8.1)

## 2020-01-15 LAB — MAGNESIUM: Magnesium: 1.8 mg/dL (ref 1.7–2.4)

## 2020-01-18 ENCOUNTER — Telehealth: Payer: Self-pay | Admitting: *Deleted

## 2020-01-18 NOTE — Telephone Encounter (Signed)
Received call from Thoracic clinic @ [facility] requesting molecular study results.Fax results from [facility] to them.

## 2020-01-21 MED FILL — Fosaprepitant Dimeglumine For IV Infusion 150 MG (Base Eq): INTRAVENOUS | Qty: 5 | Status: AC

## 2020-01-21 MED FILL — Dexamethasone Sodium Phosphate Inj 100 MG/10ML: INTRAMUSCULAR | Qty: 1 | Status: AC

## 2020-01-22 ENCOUNTER — Inpatient Hospital Stay: Payer: BC Managed Care – PPO

## 2020-01-23 NOTE — Progress Notes (Signed)
Pharmacist Chemotherapy Monitoring - Follow Up Assessment    I verify that I have reviewed each item in the below checklist:  . Regimen for the patient is scheduled for the appropriate day and plan matches scheduled date. Marland Kitchen Appropriate non-routine labs are ordered dependent on drug ordered. . If applicable, additional medications reviewed and ordered per protocol based on lifetime cumulative doses and/or treatment regimen.   Plan for follow-up and/or issues identified: No . I-vent associated with next due treatment: No . MD and/or nursing notified: No  Opal Dinning D 01/23/2020 2:53 PM

## 2020-01-28 ENCOUNTER — Telehealth: Payer: Self-pay | Admitting: Medical Oncology

## 2020-01-28 NOTE — Telephone Encounter (Signed)
Pt said to please cancel all appts and she will call back if she wants a future appt. Appts cancelled.

## 2020-01-29 ENCOUNTER — Inpatient Hospital Stay: Payer: BC Managed Care – PPO

## 2020-01-29 ENCOUNTER — Inpatient Hospital Stay: Payer: BC Managed Care – PPO | Admitting: Internal Medicine

## 2020-03-11 ENCOUNTER — Emergency Department (HOSPITAL_COMMUNITY)
Admission: EM | Admit: 2020-03-11 | Discharge: 2020-03-12 | Disposition: A | Discharge status: Home / Self Care | Payer: BC Managed Care – PPO | Attending: Emergency Medicine | Admitting: Emergency Medicine

## 2020-03-11 ENCOUNTER — Other Ambulatory Visit: Payer: Self-pay

## 2020-03-11 ENCOUNTER — Encounter (HOSPITAL_COMMUNITY): Payer: Self-pay | Admitting: Emergency Medicine

## 2020-03-11 DIAGNOSIS — Z87891 Personal history of nicotine dependence: Secondary | ICD-10-CM | POA: Diagnosis not present

## 2020-03-11 DIAGNOSIS — N10 Acute tubulo-interstitial nephritis: Secondary | ICD-10-CM

## 2020-03-11 DIAGNOSIS — J45909 Unspecified asthma, uncomplicated: Secondary | ICD-10-CM | POA: Insufficient documentation

## 2020-03-11 DIAGNOSIS — R319 Hematuria, unspecified: Secondary | ICD-10-CM | POA: Diagnosis present

## 2020-03-11 DIAGNOSIS — Z85118 Personal history of other malignant neoplasm of bronchus and lung: Secondary | ICD-10-CM | POA: Insufficient documentation

## 2020-03-11 NOTE — ED Notes (Signed)
ED Provider at bedside. 

## 2020-03-11 NOTE — ED Triage Notes (Addendum)
Pt reports having hematuria for the last week. Pt reports being on Chemo for lung cancer. Last treatment 02/28/20. Pt reports pain in right flank. Pt was sseen at [facility] urgent care and urinalysis was obtained that showed moderate blood.

## 2020-03-12 LAB — CBC WITH DIFFERENTIAL/PLATELET
Abs Immature Granulocytes: 0 10*3/uL (ref 0.00–0.07)
Basophils Absolute: 0 10*3/uL (ref 0.0–0.1)
Basophils Relative: 0 %
Eosinophils Absolute: 0 10*3/uL (ref 0.0–0.5)
Eosinophils Relative: 0 %
HCT: 26.2 % — ABNORMAL LOW (ref 36.0–46.0)
Hemoglobin: 8.6 g/dL — ABNORMAL LOW (ref 12.0–15.0)
Immature Granulocytes: 0 %
Lymphocytes Relative: 61 %
Lymphs Abs: 1.7 10*3/uL (ref 0.7–4.0)
MCH: 30.5 pg (ref 26.0–34.0)
MCHC: 32.8 g/dL (ref 30.0–36.0)
MCV: 92.9 fL (ref 80.0–100.0)
Monocytes Absolute: 0.5 10*3/uL (ref 0.1–1.0)
Monocytes Relative: 17 %
Neutro Abs: 0.6 10*3/uL — ABNORMAL LOW (ref 1.7–7.7)
Neutrophils Relative %: 22 %
Platelets: 62 10*3/uL — ABNORMAL LOW (ref 150–400)
RBC: 2.82 MIL/uL — ABNORMAL LOW (ref 3.87–5.11)
RDW: 13.2 % (ref 11.5–15.5)
WBC: 2.8 10*3/uL — ABNORMAL LOW (ref 4.0–10.5)
nRBC: 0 % (ref 0.0–0.2)

## 2020-03-12 LAB — URINALYSIS, ROUTINE W REFLEX MICROSCOPIC
Bilirubin Urine: NEGATIVE
Glucose, UA: NEGATIVE mg/dL
Hgb urine dipstick: NEGATIVE
Ketones, ur: NEGATIVE mg/dL
Leukocytes,Ua: NEGATIVE
Nitrite: NEGATIVE
Protein, ur: NEGATIVE mg/dL
Specific Gravity, Urine: 1.024 (ref 1.005–1.030)
pH: 5 (ref 5.0–8.0)

## 2020-03-12 LAB — COMPREHENSIVE METABOLIC PANEL
ALT: 36 U/L (ref 0–44)
AST: 27 U/L (ref 15–41)
Albumin: 3.8 g/dL (ref 3.5–5.0)
Alkaline Phosphatase: 62 U/L (ref 38–126)
Anion gap: 7 (ref 5–15)
BUN: 22 mg/dL (ref 8–23)
CO2: 26 mmol/L (ref 22–32)
Calcium: 9.5 mg/dL (ref 8.9–10.3)
Chloride: 108 mmol/L (ref 98–111)
Creatinine, Ser: 0.8 mg/dL (ref 0.44–1.00)
GFR calc Af Amer: >60 mL/min (ref >60)
GFR calc non Af Amer: >60 mL/min (ref >60)
Glucose, Bld: 77 mg/dL (ref 70–99)
Potassium: 4 mmol/L (ref 3.5–5.1)
Sodium: 141 mmol/L (ref 135–145)
Total Bilirubin: 0.5 mg/dL (ref 0.3–1.2)
Total Protein: 6.8 g/dL (ref 6.5–8.1)

## 2020-03-12 MED ORDER — CEPHALEXIN 500 MG PO CAPS: 500.0000 mg | ORAL_CAPSULE | Freq: Four times a day (QID) | ORAL | 0 refills | Status: DC

## 2020-03-12 MED ORDER — SODIUM CHLORIDE 0.9 % IV SOLN
1.0000 g | Freq: Once | INTRAVENOUS | Status: AC
  Administered 2020-03-12: 1 g via INTRAVENOUS
  Filled 2020-03-12: qty 10

## 2020-03-12 NOTE — Discharge Instructions (Signed)
We saw you in the ER for back pain. Went suspect that you have pyelonephritis.  Please take the antibiotics prescribed.  Please return to the ER if your symptoms worsen; you have increased pain, fevers, chills, inability to keep any medications down, confusion. Otherwise see the outpatient doctor as requested.

## 2020-03-12 NOTE — ED Provider Notes (Signed)
[facility] DEPT Provider Note   CSN: [id number] Arrival date & time: 03/11/20  1846     History Chief Complaint  Patient presents with  . Hematuria    Sarah Lee is a 69 y.o. female.  HPI    69 year old female comes in a chief complaint of back pain.  Patient has history of lung cancer and is undergoing chemotherapy at [facility].  Patient reports that over the past few days she has noted some malaise, and intermittent episodes of discomfort with urination.  She thinks she might be having urinary frequency but unsure, as she normally drinks a lot of water and frequently urinates.  More recently she started noticing some flank pain.  The pain has been present for the past few days and she went to an urgent care today where his urine showed blood, and she was advised to come to the ER.  Patient denies any chest pain, cough.  She reports having some abdominal pain, that has since resolved.  No recent UTI.   Past Medical History:  Diagnosis Date  . Allergy   . Anemia   . Arthritis   . Asthma    as a child  . Blood transfusion   . Cancer ([place])    lung cancer  . Cataract    slight  . Complication of anesthesia    used sodium pentothal and very hard to wake up and arm swelled up from medication  . Constipation   . Gallstones   . GERD (gastroesophageal reflux disease)   . Heart murmur    no issues  . Pneumonia   . Seasonal allergies     Patient Active Problem List   Diagnosis Date Noted  . 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 Encounter for antineoplastic chemotherapy 10/23/2019  . Goals of care, counseling/discussion 10/23/2019  . S/P thoracotomy 10/01/2019  . Lung cancer ([place]) 09/18/2019  . Pulmonary infiltrate present on computed tomography 07/11/2019  . Abdominal pain 07/11/2019  . Post ERCP Pancreatitis 10/30/2016  . Asthma   . GERD (gastroesophageal reflux disease)   . Choledocholithiasis s/p ERCP 10/29/2016   .  Chronic cholecystitis with calculus s/p lap cholecystectomy 10/28/2016 10/28/2016  . Hemorrhoids 10/12/2012  . Obesity 05/20/2011  . Constipation 05/20/2011    Past Surgical History:  Procedure Laterality Date  . ABDOMINAL HYSTERECTOMY  2004   fibroids--- TAH/BSO  . BREAST SURGERY     right breast biopsy-benign  . CESAREAN SECTION    . CHOLECYSTECTOMY N/A 10/28/2016   Procedure: LAPAROSCOPIC CHOLECYSTECTOMY WITH INTRAOPERATIVE CHOLANGIOGRAM;  Surgeon: Jackolyn Confer, MD;  Location: [facility];  Service: General;  Laterality: N/A;  . COLONOSCOPY    . ERCP N/A 10/29/2016   Procedure: ENDOSCOPIC RETROGRADE CHOLANGIOPANCREATOGRAPHY (ERCP);  Surgeon: Doran Stabler, MD;  Location: [facility];  Service: Endoscopy;  Laterality: N/A;  . MULTIPLE EXTRACTIONS WITH ALVEOLOPLASTY Bilateral 12/06/2019   Procedure: Extraction of tooth #'s 4, and 7-13 with alveoloplasty  and bilateral maxillary  tuberosity reductions;  Surgeon: Lenn Cal, DDS;  Location: [facility];  Service: Oral Surgery;  Laterality: Bilateral;  . NODE DISSECTION Left 10/01/2019   Procedure: Node Dissection;  Surgeon: [name], MD;  Location: [facility];  Service: Thoracic;  Laterality: Left;  . POLYPECTOMY    . UTERINE FIBROID SURGERY    . VIDEO BRONCHOSCOPY N/A 10/01/2019   Procedure: VIDEO BRONCHOSCOPY;  Surgeon: [name], MD;  Location: [facility];  Service: Thoracic;  Laterality:  N/A;     OB History    Gravida  6   Para  2   Term  2   Preterm      AB  4   Living  2     SAB  4   TAB      Ectopic      Multiple      Live Births              Family History  Problem Relation Age of Onset  . Cancer Father        prostate  . Anemia Mother   . Stomach cancer Cousin        maternal side   . Stomach cancer Maternal Uncle   . Colon cancer Neg Hx   . Esophageal cancer Neg Hx   . Rectal cancer Neg Hx   . Colon polyps Neg Hx     Social History   Tobacco Use  . Smoking status: Former  Smoker    Packs/day: 1.50    Years: 25.00    Pack years: 37.50    Types: Cigarettes    Quit date: 03/02/1988    Years since quitting: 32.0  . Smokeless tobacco: Never Used  Vaping Use  . Vaping Use: Never used  Substance Use Topics  . Alcohol use: Yes    Comment: wine occassionally  . Drug use: Never    Home Medications Prior to Admission medications   Medication Sig Start Date End Date Taking? Authorizing Provider  Acetylcysteine (N-ACETYL-L-CYSTEINE PO) Take 1 tablet by mouth daily.    Yes [provider]  Ascorbic Acid (VITAMIN C) 1000 MG tablet Take 1,000 mg by mouth daily.   Yes [provider]  Cholecalciferol (VITAMIN D-3) 125 MCG (5000 UT) TABS Take 10,000 Units by mouth daily.   Yes [provider]  dexamethasone (DECADRON) 4 MG tablet 1 tablet p.o. twice daily the day before, day of and day after chemotherapy every 3 weeks 10/23/19  Yes Curt Bears, MD  folic acid (FOLVITE) 1 MG tablet Take 1 tablet (1 mg total) by mouth daily. 10/23/19  Yes Curt Bears, MD  OVER THE COUNTER MEDICATION Take 1 capsule by mouth daily. Ultra Flora Probiotic   Yes [provider]  prochlorperazine (COMPAZINE) 10 MG tablet Take 1 tablet (10 mg total) by mouth every 6 (six) hours as needed for nausea or vomiting. 10/23/19  Yes Curt Bears, MD  traMADol (ULTRAM) 50 MG tablet Take 1 tablet (50 mg total) by mouth every 6 (six) hours as needed. Patient taking differently: Take 50 mg by mouth every 6 (six) hours as needed for moderate pain.  12/06/19 12/05/20 Yes Lenn Cal, DDS  cephALEXin (KEFLEX) 500 MG capsule Take 1 capsule (500 mg total) by mouth 4 (four) times daily. 03/12/20   Varney Biles, MD  mupirocin ointment (BACTROBAN) 2 % Apply 1 application topically 2 (two) times daily. Patient not taking: Reported on 03/11/2020 12/14/19   Wieters, Hallie C, PA-C  pregabalin (LYRICA) 25 MG capsule Take 1 capsule (25 mg total) by mouth 2 (two) times  daily. Patient not taking: Reported on 03/11/2020 11/23/19   [name], MD    Allergies    Advil [ibuprofen], Iron, Penicillins, and Tetracyclines & related  Review of Systems   Review of Systems  Constitutional: Positive for activity change, chills and fatigue.  Respiratory: Negative for shortness of breath.   Cardiovascular: Negative for chest pain.  Gastrointestinal: Negative for nausea  and vomiting.  Genitourinary: Positive for flank pain. Negative for dysuria.  Allergic/Immunologic: Positive for immunocompromised state.  All other systems reviewed and are negative.   Physical Exam Updated Vital Signs BP (!) 155/70 (BP Location: Right Arm)   Pulse 87   Temp 98 F (36.7 C) (Oral)   Resp 17   Ht 5\' 4"  (1.626 m)   Wt 94.3 kg   LMP 01/17/1994   SpO2 100%   BMI 35.70 kg/m   Physical Exam Vitals and nursing note reviewed.  Constitutional:      Appearance: She is well-developed.  HENT:     Head: Normocephalic and atraumatic.  Cardiovascular:     Rate and Rhythm: Normal rate.  Pulmonary:     Effort: Pulmonary effort is normal.  Abdominal:     General: Bowel sounds are normal.  Genitourinary:    Comments: Patient has reproducible flank tenderness on the right side.  No right upper quadrant epigastric tenderness. Musculoskeletal:     Cervical back: Normal range of motion and neck supple.  Skin:    General: Skin is warm and dry.  Neurological:     Mental Status: She is alert and oriented to person, place, and time.     ED Results / Procedures / Treatments   Labs (all labs ordered are listed, but only abnormal results are displayed) Labs Reviewed  CBC WITH DIFFERENTIAL/PLATELET - Abnormal; Notable for the following components:      Result Value   WBC 2.8 (*)    RBC 2.82 (*)    Hemoglobin 8.6 (*)    HCT 26.2 (*)    Platelets 62 (*)    Neutro Abs 0.6 (*)    All other components within normal limits  URINE CULTURE  COMPREHENSIVE METABOLIC PANEL   URINALYSIS, ROUTINE W REFLEX MICROSCOPIC    EKG None  Radiology No results found.  Procedures Procedures (including critical care time)  Medications Ordered in ED Medications  cefTRIAXone (ROCEPHIN) 1 g in sodium chloride 0.9 % 100 mL IVPB ( Intravenous Stopped 03/12/20 0137)    ED Course  I have reviewed the triage vital signs and the nursing notes.  Pertinent labs & imaging results that were available during my care of the patient were reviewed by me and considered in my medical decision making (see chart for details).    MDM Rules/Calculators/A&P                          69 year old comes in a chief complaint of flank pain. Clinical suspicion for pyelonephritis. No clinical concerns for PE at this time.  Patient's UA in the ER is clear.  Patient is however having malaise, chills, questionable urinary symptoms.  We decided to start her on antibiotics.  She will return to the ER if her symptoms get worse.  If her symptoms persist but do not improve then she will follow-up with her PCP, she might need CT scans if the symptoms are persistent despite antibiotics.  Final Clinical Impression(s) / ED Diagnoses Final diagnoses:  Acute pyelonephritis    Rx / DC Orders ED Discharge Orders         Ordered    cephALEXin (KEFLEX) 500 MG capsule  4 times daily     Discontinue  Reprint     03/12/20 0153           Varney Biles, MD 03/12/20 9798

## 2020-03-13 LAB — URINE CULTURE: Culture: 20000 — AB

## 2020-03-27 ENCOUNTER — Ambulatory Visit (HOSPITAL_COMMUNITY)
Admission: EM | Admit: 2020-03-27 | Discharge: 2020-03-27 | Disposition: A | Discharge status: Home / Self Care | Payer: BC Managed Care – PPO | Attending: Family Medicine | Admitting: Family Medicine

## 2020-03-27 ENCOUNTER — Encounter (HOSPITAL_COMMUNITY): Payer: Self-pay

## 2020-03-27 ENCOUNTER — Other Ambulatory Visit: Payer: Self-pay

## 2020-03-27 DIAGNOSIS — Z20822 Contact with and (suspected) exposure to covid-19: Secondary | ICD-10-CM | POA: Insufficient documentation

## 2020-03-27 LAB — SARS CORONAVIRUS 2 (TAT 6-24 HRS): SARS Coronavirus 2: NEGATIVE

## 2020-03-27 NOTE — Discharge Instructions (Signed)
Go home to rest Drink plenty of fluids Take Tylenol if needed for pain or fever You may take over-the-counter cough and cold medicines as needed You must quarantine at home until your test result is available You can check for your test result in MyChart

## 2020-03-27 NOTE — ED Triage Notes (Signed)
Pt presents to [facility] requesting covid testing. Pt denies symptoms. Pt here with husband who has had fever x2 days. Pt on chemo, wants to know covid +/-

## 2020-03-27 NOTE — ED Provider Notes (Signed)
[place]    CSN: [id number] Arrival date & time: 03/27/20  1420      History   Chief Complaint Chief Complaint  Patient presents with  . covid test    HPI Sarah Lee is a 69 y.o. female.   HPI   Patient undergoing treatment For lung cancer Is getting chemotherapy Her most recent CBC showed immune compromise: Results for TENE, GATO (MRN [id number]) as of 03/27/2020 16:38  Ref. Range 03/12/2020 00:56  WBC Latest Ref Range: 4.0 - 10.5 K/uL 2.8 (L)  RBC Latest Ref Range: 3.87 - 5.11 MIL/uL 2.82 (L)  Hemoglobin Latest Ref Range: 12.0 - 15.0 g/dL 8.6 (L)  HCT Latest Ref Range: 36 - 46 % 26.2 (L)  MCV Latest Ref Range: 80.0 - 100.0 fL 92.9  MCH Latest Ref Range: 26.0 - 34.0 pg 30.5  MCHC Latest Ref Range: 30.0 - 36.0 g/dL 32.8  RDW Latest Ref Range: 11.5 - 15.5 % 13.2  Platelets Latest Ref Range: 150 - 400 K/uL 62 (L)  nRBC Latest Ref Range: 0.0 - 0.2 % 0.0  Here for COVID test Has not had vaccinations Husband works in Scientist, research (medical) and has had a temp to 101 the last 2 days Yazlin is asymptomatic  Past Medical History:  Diagnosis Date  . Allergy   . Anemia   . Arthritis   . Asthma    as a child  . Blood transfusion   . Cancer ([place])    lung cancer  . Cataract    slight  . Complication of anesthesia    used sodium pentothal and very hard to wake up and arm swelled up from medication  . Constipation   . Gallstones   . GERD (gastroesophageal reflux disease)   . Heart murmur    no issues  . Pneumonia   . Seasonal allergies     Patient Active Problem List   Diagnosis Date Noted  . 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 Encounter for antineoplastic chemotherapy 10/23/2019  . Goals of care, counseling/discussion 10/23/2019  . S/P thoracotomy 10/01/2019  . Lung cancer ([place]) 09/18/2019  . Pulmonary infiltrate present on computed tomography 07/11/2019  . Abdominal pain 07/11/2019  . Post ERCP Pancreatitis 10/30/2016  . Asthma   .  GERD (gastroesophageal reflux disease)   . Choledocholithiasis s/p ERCP 10/29/2016   . Chronic cholecystitis with calculus s/p lap cholecystectomy 10/28/2016 10/28/2016  . Hemorrhoids 10/12/2012  . Obesity 05/20/2011  . Constipation 05/20/2011    Past Surgical History:  Procedure Laterality Date  . ABDOMINAL HYSTERECTOMY  2004   fibroids--- TAH/BSO  . BREAST SURGERY     right breast biopsy-benign  . CESAREAN SECTION    . CHOLECYSTECTOMY N/A 10/28/2016   Procedure: LAPAROSCOPIC CHOLECYSTECTOMY WITH INTRAOPERATIVE CHOLANGIOGRAM;  Surgeon: Jackolyn Confer, MD;  Location: [facility];  Service: General;  Laterality: N/A;  . COLONOSCOPY    . ERCP N/A 10/29/2016   Procedure: ENDOSCOPIC RETROGRADE CHOLANGIOPANCREATOGRAPHY (ERCP);  Surgeon: Doran Stabler, MD;  Location: [facility];  Service: Endoscopy;  Laterality: N/A;  . MULTIPLE EXTRACTIONS WITH ALVEOLOPLASTY Bilateral 12/06/2019   Procedure: Extraction of tooth #'s 4, and 7-13 with alveoloplasty  and bilateral maxillary  tuberosity reductions;  Surgeon: Lenn Cal, DDS;  Location: [facility];  Service: Oral Surgery;  Laterality: Bilateral;  . NODE DISSECTION Left 10/01/2019   Procedure: Node Dissection;  Surgeon: [name], MD;  Location: [facility];  Service: Thoracic;  Laterality: Left;  .  POLYPECTOMY    . UTERINE FIBROID SURGERY    . VIDEO BRONCHOSCOPY N/A 10/01/2019   Procedure: VIDEO BRONCHOSCOPY;  Surgeon: [name], MD;  [facility];  Service: Thoracic;  Laterality: N/A;    OB History    Gravida  6   Para  2   Term  2   Preterm      AB  4   Living  2     SAB  4   TAB      Ectopic      Multiple      Live Births               Home Medications    Prior to Admission medications   Medication Sig Start Date End Date Taking? Authorizing Provider  Acetylcysteine (N-ACETYL-L-CYSTEINE PO) Take 1 tablet by mouth daily.     [provider]  Ascorbic Acid (VITAMIN C) 1000 MG tablet  Take 1,000 mg by mouth daily.    [provider]  cephALEXin (KEFLEX) 500 MG capsule Take 1 capsule (500 mg total) by mouth 4 (four) times daily. 03/12/20   Varney Biles, MD  Cholecalciferol (VITAMIN D-3) 125 MCG (5000 UT) TABS Take 10,000 Units by mouth daily.    [provider]  dexamethasone (DECADRON) 4 MG tablet 1 tablet p.o. twice daily the day before, day of and day after chemotherapy every 3 weeks 10/23/19   Curt Bears, MD  folic acid (FOLVITE) 1 MG tablet Take 1 tablet (1 mg total) by mouth daily. 10/23/19   Curt Bears, MD  methocarbamol (ROBAXIN) 500 MG tablet Take by mouth. 03/25/20   [provider]  mupirocin ointment (BACTROBAN) 2 % Apply 1 application topically 2 (two) times daily. Patient not taking: Reported on 03/11/2020 12/14/19   Wieters, [employer] C, PA-C  OVER THE COUNTER MEDICATION Take 1 capsule by mouth daily. Ultra Flora Probiotic    [provider]  pregabalin (LYRICA) 25 MG capsule Take 1 capsule (25 mg total) by mouth 2 (two) times daily. Patient not taking: Reported on 03/11/2020 11/23/19   [name], MD  prochlorperazine (COMPAZINE) 10 MG tablet Take 1 tablet (10 mg total) by mouth every 6 (six) hours as needed for nausea or vomiting. 10/23/19   Curt Bears, MD  traMADol (ULTRAM) 50 MG tablet Take 1 tablet (50 mg total) by mouth every 6 (six) hours as needed. Patient taking differently: Take 50 mg by mouth every 6 (six) hours as needed for moderate pain.  12/06/19 12/05/20  Lenn Cal, DDS    Family History Family History  Problem Relation Age of Onset  . Cancer Father        prostate  . Anemia Mother   . Stomach cancer Cousin        maternal side   . Stomach cancer Maternal Uncle   . Colon cancer Neg Hx   . Esophageal cancer Neg Hx   . Rectal cancer Neg Hx   . Colon polyps Neg Hx     Social History Social History   Tobacco Use  . Smoking status: Former Smoker    Packs/day: 1.50    Years:  25.00    Pack years: 37.50    Types: Cigarettes    Quit date: 03/02/1988    Years since quitting: 32.0  . Smokeless tobacco: Never Used  Vaping Use  . Vaping Use: Never used  Substance Use Topics  . Alcohol use: Yes    Comment: wine  occassionally  . Drug use: Never     Allergies   Advil [ibuprofen], Iron, Penicillins, and Tetracyclines & related   Review of Systems Review of Systems See HPI NO fever , body aches, infection  Physical Exam Triage Vital Signs ED Triage Vitals  Enc Vitals Group     BP 03/27/20 1557 116/78     Pulse Rate 03/27/20 1557 81     Resp 03/27/20 1557 16     Temp 03/27/20 1557 98.5 F (36.9 C)     Temp Source 03/27/20 1557 Oral     SpO2 03/27/20 1557 99 %     Weight --      Height --      Head Circumference --      Peak Flow --      Pain Score 03/27/20 1559 0     Pain Loc --      Pain Edu? --      Excl. in [place]? --    No data found.  Updated Vital Signs BP 116/78 (BP Location: Right Arm)   Pulse 81   Temp 98.5 F (36.9 C) (Oral)   Resp 16   LMP 01/17/1994   SpO2 99%       Physical Exam Constitutional:      General: She is not in acute distress.    Appearance: She is well-developed.     Comments: Exam by observation  HENT:     Head: Normocephalic and atraumatic.     Mouth/Throat:     Comments: mask Eyes:     Conjunctiva/sclera: Conjunctivae normal.     Pupils: Pupils are equal, round, and reactive to light.  Cardiovascular:     Rate and Rhythm: Normal rate.  Pulmonary:     Effort: Pulmonary effort is normal. No respiratory distress.  Abdominal:     General: There is no distension.     Palpations: Abdomen is soft.  Musculoskeletal:        General: Normal range of motion.     Cervical back: Normal range of motion.  Skin:    General: Skin is warm and dry.  Neurological:     Mental Status: She is alert.      UC Treatments / Results  Labs (all labs ordered are listed, but only abnormal results are displayed) Labs  Reviewed  SARS CORONAVIRUS 2 (TAT 6-24 HRS)    EKG   Radiology No results found.  Procedures Procedures (including critical care time)  Medications Ordered in UC Medications - No data to display  Initial Impression / Assessment and Plan / UC Course  I have reviewed the triage vital signs and the nursing notes.  Pertinent labs & imaging results that were available during my care of the patient were reviewed by me and considered in my medical decision making (see chart for details).     Here for testing Final Clinical Impressions(s) / UC Diagnoses   Final diagnoses:  Encounter for screening laboratory testing for COVID-19 virus     Discharge Instructions     Go home to rest Drink plenty of fluids Take Tylenol if needed for pain or fever You may take over-the-counter cough and cold medicines as needed You must quarantine at home until your test result is available You can check for your test result in MyChart    ED Prescriptions    None     PDMP not reviewed this encounter.   Raylene Everts, MD 03/27/20 [phone]

## 2020-05-08 ENCOUNTER — Other Ambulatory Visit: Payer: Self-pay | Admitting: Thoracic Surgery (Cardiothoracic Vascular Surgery)

## 2020-05-08 DIAGNOSIS — C3490 Malignant neoplasm of unspecified part of unspecified bronchus or lung: Secondary | ICD-10-CM

## 2020-05-09 ENCOUNTER — Other Ambulatory Visit: Payer: Self-pay

## 2020-05-09 ENCOUNTER — Encounter: Payer: Self-pay | Admitting: Thoracic Surgery (Cardiothoracic Vascular Surgery)

## 2020-05-09 ENCOUNTER — Ambulatory Visit
Admission: RE | Admit: 2020-05-09 | Discharge: 2020-05-09 | Disposition: A | Discharge status: Home / Self Care | Payer: BC Managed Care – PPO | Source: Ambulatory Visit | Attending: Thoracic Surgery (Cardiothoracic Vascular Surgery) | Admitting: Thoracic Surgery (Cardiothoracic Vascular Surgery)

## 2020-05-09 ENCOUNTER — Ambulatory Visit (INDEPENDENT_AMBULATORY_CARE_PROVIDER_SITE_OTHER): Payer: BC Managed Care – PPO | Admitting: Thoracic Surgery (Cardiothoracic Vascular Surgery)

## 2020-05-09 VITALS — BP 133/79 | HR 79 | Temp 97.6°F | Resp 20 | Ht 64.0 in | Wt 212.0 lb

## 2020-05-09 DIAGNOSIS — C3490 Malignant neoplasm of unspecified part of unspecified bronchus or lung: Secondary | ICD-10-CM

## 2020-05-09 DIAGNOSIS — Z902 Acquired absence of lung [part of]: Secondary | ICD-10-CM | POA: Diagnosis not present

## 2020-05-09 DIAGNOSIS — C3492 Malignant neoplasm of unspecified part of left bronchus or lung: Secondary | ICD-10-CM | POA: Diagnosis not present

## 2020-05-09 NOTE — Progress Notes (Signed)
[address]       [place] [address]             [phone]                    [name] Medical Record #[id number] Date of Birth: 01-06-1951  Referring: Tanda Rockers, MD Primary Care: Willey Blade, MD Primary Cardiologist: No primary care provider on file.  Chief Complaint:    Chief Complaint  Patient presents with  . Lung Cancer    6 month f/u with CXR    History of Present Illness:    Sarah Lee 69 y.o. female who presents for 33-month follow-up.  In January 2021 she underwent a left robotic assisted left lower lobectomy for a stage IIa non-small cell lung cancer.  She followed up at [facility] for adjuvant therapy and has completed her course.  She was instructed to follow-up with me for further surveillance.  Overall she is doing well.  She denies any shortness of breath only occasional incisional pain with certain movements.       Zubrod Score: At the time of surgery this patient's most appropriate activity status/level should be described as: [x]     0    Normal activity, no symptoms []     1    Restricted in physical strenuous activity but ambulatory, able to do out light work []     2    Ambulatory and capable of self care, unable to do work activities, up and about               >50 % of waking hours                              []     3    Only limited self care, in bed greater than 50% of waking hours []     4    Completely disabled, no self care, confined to bed or chair []     5    Moribund   Past Medical History:  Diagnosis Date  . Allergy   . Anemia   . Arthritis   . Asthma    as a child  . Blood transfusion   . Cancer ([place])    lung cancer  . Cataract    slight  . Complication of anesthesia    used sodium pentothal and very hard to wake up and arm swelled up from medication  . Constipation   . Gallstones   . GERD (gastroesophageal reflux disease)   . Heart murmur    no issues  . Pneumonia   .  Seasonal allergies     Past Surgical History:  Procedure Laterality Date  . ABDOMINAL HYSTERECTOMY  2004   fibroids--- TAH/BSO  . BREAST SURGERY     right breast biopsy-benign  . CESAREAN SECTION    . CHOLECYSTECTOMY N/A 10/28/2016   Procedure: LAPAROSCOPIC CHOLECYSTECTOMY WITH INTRAOPERATIVE CHOLANGIOGRAM;  Surgeon: Jackolyn Confer, MD;  [facility];  Service: General;  Laterality: N/A;  . COLONOSCOPY    . ERCP N/A 10/29/2016   Procedure: ENDOSCOPIC RETROGRADE CHOLANGIOPANCREATOGRAPHY (ERCP);  Surgeon: Doran Stabler, MD;  Location: [facility];  Service: Endoscopy;  Laterality: N/A;  . MULTIPLE EXTRACTIONS WITH ALVEOLOPLASTY Bilateral 12/06/2019   Procedure: Extraction of tooth #'s 4, and 7-13 with alveoloplasty  and bilateral maxillary  tuberosity reductions;  Surgeon:  Lenn Cal, DDS;  Location: [facility];  Service: Oral Surgery;  Laterality: Bilateral;  . NODE DISSECTION Left 10/01/2019   Procedure: Node Dissection;  Surgeon: [name], MD;  Location: [facility];  Service: Thoracic;  Laterality: Left;  . POLYPECTOMY    . UTERINE FIBROID SURGERY    . VIDEO BRONCHOSCOPY N/A 10/01/2019   Procedure: VIDEO BRONCHOSCOPY;  Surgeon: [name], MD;  [facility];  Service: Thoracic;  Laterality: N/A;    Family History  Problem Relation Age of Onset  . Cancer Father        prostate  . Anemia Mother   . Stomach cancer Cousin        maternal side   . Stomach cancer Maternal Uncle   . Colon cancer Neg Hx   . Esophageal cancer Neg Hx   . Rectal cancer Neg Hx   . Colon polyps Neg Hx      Social History   Tobacco Use  Smoking Status Former Smoker  . Packs/day: 1.50  . Years: 25.00  . Pack years: 37.50  . Types: Cigarettes  . Quit date: 03/02/1988  . Years since quitting: 32.2  Smokeless Tobacco Never Used    Social History   Substance and Sexual Activity  Alcohol Use Yes   Comment: wine occassionally     Allergies  Allergen Reactions  .  Advil [Ibuprofen] Other (See Comments)    Rectal bleeding   . Iron Other (See Comments)    Extreme constipation  . Penicillins Rash    Did it involve swelling of the face/tongue/throat, SOB, or low BP? No Did it involve sudden or severe rash/hives, skin peeling, or any reaction on the inside of your mouth or nose? No Did you need to seek medical attention at a hospital or doctor's office? No When did it last happen?~20 years or so If all above answers are "NO", may proceed with cephalosporin use.   . Tetracyclines & Related Nausea And Vomiting and Anxiety    Current Outpatient Medications  Medication Sig Dispense Refill  . Acetylcysteine (N-ACETYL-L-CYSTEINE PO) Take 1 tablet by mouth daily.     . Ascorbic Acid (VITAMIN C) 1000 MG tablet Take 500 mg by mouth daily.     . Cholecalciferol (VITAMIN D-3) 125 MCG (5000 UT) TABS Take 10,000 Units by mouth daily.    . folic acid (FOLVITE) 1 MG tablet Take 1 tablet (1 mg total) by mouth daily. 30 tablet 4  . OVER THE COUNTER MEDICATION Take 1 capsule by mouth daily. Ultra Flora Probiotic    . methocarbamol (ROBAXIN) 500 MG tablet Take by mouth. (Patient not taking: Reported on 05/09/2020)    . mupirocin ointment (BACTROBAN) 2 % Apply 1 application topically 2 (two) times daily. (Patient not taking: Reported on 03/11/2020) 30 g 0   No current facility-administered medications for this visit.    Review of Systems  Musculoskeletal: Positive for myalgias.  All other systems reviewed and are negative.    PHYSICAL EXAMINATION: BP 133/79   Pulse 79   Temp 97.6 F (36.4 C) (Skin)   Resp 20   Ht 5\' 4"  (1.626 m)   Wt 212 lb (96.2 kg)   LMP 01/17/1994   SpO2 97% Comment: RA  BMI 36.39 kg/m  Physical Exam Constitutional:      Appearance: Normal appearance.  Eyes:     Extraocular Movements: Extraocular movements intact.     Conjunctiva/sclera: Conjunctivae normal.  Cardiovascular:     Rate  and Rhythm: Normal rate.  Pulmonary:      Effort: Pulmonary effort is normal. No respiratory distress.  Musculoskeletal:        General: Normal range of motion.     Cervical back: Normal range of motion.  Skin:    General: Skin is warm and dry.  Neurological:     General: No focal deficit present.     Mental Status: She is alert and oriented to person, place, and time.     Diagnostic Studies & Laboratory data:     Recent Radiology Findings:   DG Chest 2 View  Result Date: 05/09/2020 CLINICAL DATA:  Lung cancer. EXAM: CHEST - 2 VIEW COMPARISON:  10/31/2019.  10/12/2019. FINDINGS: Mediastinum and hilar structures normal. Heart size normal. Pulmonary vascularity normal. Atelectatic changes and or scarring left lung base with improved aeration from prior exam. Improved left pleural effusion. Mild residual left base pleural thickening may be related to scarring. No pneumothorax. Degenerative change thoracic spine. Surgical clips right upper quadrant. IMPRESSION: Atelectatic changes and or scarring left lung base with improved aeration from prior exam. Improved left pleural effusion. Mild residual left base pleural thickening may be related to scarring. Electronically Signed   By: Marcello Moores  Register   On: 05/09/2020 11:07       I have independently reviewed the above radiology studies  and reviewed the findings with the patient.   Recent Lab Findings: Lab Results  Component Value Date   WBC 2.8 (L) 03/12/2020   HGB 8.6 (L) 03/12/2020   HCT 26.2 (L) 03/12/2020   PLT 62 (L) 03/12/2020   GLUCOSE 77 03/12/2020   CHOL 200 02/14/2014   TRIG 140.0 02/14/2014   HDL 55.00 02/14/2014   LDLCALC 117 (H) 02/14/2014   ALT 36 03/12/2020   AST 27 03/12/2020   NA 141 03/12/2020   K 4.0 03/12/2020   CL 108 03/12/2020   CREATININE 0.80 03/12/2020   BUN 22 03/12/2020   CO2 26 03/12/2020   TSH 0.86 02/14/2014   INR 1.0 09/27/2019      Assessment / Plan:   S/p L RATS, LLLectomy for [id number] Stage IIA NSCLC on 10/01/2019 She recently  completed dose of adjuvant therapy at [facility]. Overall she is doing well.  Her chest x-ray was clear She will follow-up in December with annual CT chest.     I  spent 20 minutes with  the patient face to face and greater then 50% of the time was spent in counseling and coordination of care.    [name] 05/09/2020 1:38 PM

## 2020-07-31 ENCOUNTER — Other Ambulatory Visit: Payer: Self-pay | Admitting: Thoracic Surgery (Cardiothoracic Vascular Surgery)

## 2020-07-31 DIAGNOSIS — C3492 Malignant neoplasm of unspecified part of left bronchus or lung: Secondary | ICD-10-CM

## 2020-08-29 ENCOUNTER — Ambulatory Visit (INDEPENDENT_AMBULATORY_CARE_PROVIDER_SITE_OTHER): Payer: BC Managed Care – PPO | Admitting: Thoracic Surgery (Cardiothoracic Vascular Surgery)

## 2020-08-29 ENCOUNTER — Other Ambulatory Visit: Payer: Self-pay | Admitting: Thoracic Surgery (Cardiothoracic Vascular Surgery)

## 2020-08-29 ENCOUNTER — Encounter: Payer: Self-pay | Admitting: Thoracic Surgery (Cardiothoracic Vascular Surgery)

## 2020-08-29 ENCOUNTER — Other Ambulatory Visit: Payer: Self-pay

## 2020-08-29 ENCOUNTER — Ambulatory Visit
Admission: RE | Admit: 2020-08-29 | Discharge: 2020-08-29 | Disposition: A | Discharge status: Home / Self Care | Payer: Medicare Other | Source: Ambulatory Visit | Attending: Thoracic Surgery (Cardiothoracic Vascular Surgery) | Admitting: Thoracic Surgery (Cardiothoracic Vascular Surgery)

## 2020-08-29 VITALS — BP 131/76 | HR 86 | Resp 20 | Ht 64.0 in

## 2020-08-29 DIAGNOSIS — C3492 Malignant neoplasm of unspecified part of left bronchus or lung: Secondary | ICD-10-CM | POA: Diagnosis not present

## 2020-08-29 DIAGNOSIS — E041 Nontoxic single thyroid nodule: Secondary | ICD-10-CM

## 2020-08-29 NOTE — Progress Notes (Signed)
Sarah Lee       [place] [address]             [id number]                    Sherea A Reitan Edna Medical Record #[id number] Date of Birth: 1951/06/02  Referring: Tanda Rockers, MD Primary Care: Willey Blade, MD Primary Cardiologist: No primary care provider on file.  Chief Complaint:    Chief Complaint  Patient presents with  . Lung Cancer    4 month f/u with Chest CT    History of Present Illness:    Sarah Lee 69 y.o. female comes in for 56-month follow-up appointment.  Overall she has been doing well.  She is remodeling her kitchen, and states that she has been tired from this.  She is completed her chemotherapy at [facility], and has decided to follow-up with [place] for surveillance.      Past Medical History:  Diagnosis Date  . Allergy   . Anemia   . Arthritis   . Asthma    as a child  . Blood transfusion   . Cancer ([place])    lung cancer  . Cataract    slight  . Complication of anesthesia    used sodium pentothal and very hard to wake up and arm swelled up from medication  . Constipation   . Gallstones   . GERD (gastroesophageal reflux disease)   . Heart murmur    no issues  . Pneumonia   . Seasonal allergies     Past Surgical History:  Procedure Laterality Date  . ABDOMINAL HYSTERECTOMY  2004   fibroids--- TAH/BSO  . BREAST SURGERY     right breast biopsy-benign  . CESAREAN SECTION    . CHOLECYSTECTOMY N/A 10/28/2016   Procedure: LAPAROSCOPIC CHOLECYSTECTOMY WITH INTRAOPERATIVE CHOLANGIOGRAM;  Surgeon: Jackolyn Confer, MD;  Location: [facility];  Service: General;  Laterality: N/A;  . COLONOSCOPY    . ERCP N/A 10/29/2016   Procedure: ENDOSCOPIC RETROGRADE CHOLANGIOPANCREATOGRAPHY (ERCP);  Surgeon: Doran Stabler, MD;  Location: [facility];  Service: Endoscopy;  Laterality: N/A;  . MULTIPLE EXTRACTIONS WITH ALVEOLOPLASTY Bilateral 12/06/2019   Procedure: Extraction of tooth #'s 4, and 7-13 with alveoloplasty  and  bilateral maxillary  tuberosity reductions;  Surgeon: Lenn Cal, DDS;  Location: [place];  Service: Oral Surgery;  Laterality: Bilateral;  . NODE DISSECTION Left 10/01/2019   Procedure: Node Dissection;  Surgeon: [name], MD;  Location: [place];  Service: Thoracic;  Laterality: Left;  . POLYPECTOMY    . UTERINE FIBROID SURGERY    . VIDEO BRONCHOSCOPY N/A 10/01/2019   Procedure: VIDEO BRONCHOSCOPY;  Surgeon: [name], MD;  [facility];  Service: Thoracic;  Laterality: N/A;    Family History  Problem Relation Age of Onset  . Cancer Father        prostate  . Anemia Mother   . Stomach cancer Cousin        maternal side   . Stomach cancer Maternal Uncle   . Colon cancer Neg Hx   . Esophageal cancer Neg Hx   . Rectal cancer Neg Hx   . Colon polyps Neg Hx      Social History   Tobacco Use  Smoking Status Former Smoker  . Packs/day: 1.50  . Years: 25.00  . Pack years: 37.50  . Types: Cigarettes  . Quit date: 03/02/1988  .  Years since quitting: 32.5  Smokeless Tobacco Never Used    Social History   Substance and Sexual Activity  Alcohol Use Yes   Comment: wine occassionally     Allergies  Allergen Reactions  . Advil [Ibuprofen] Other (See Comments)    Rectal bleeding   . Iron Other (See Comments)    Extreme constipation  . Penicillins Rash    Did it involve swelling of the face/tongue/throat, SOB, or low BP? No Did it involve sudden or severe rash/hives, skin peeling, or any reaction on the inside of your mouth or nose? No Did you need to seek medical attention at a hospital or doctor's office? No When did it last happen?~20 years or so If all above answers are "NO", may proceed with cephalosporin use.   . Tetracyclines & Related Nausea And Vomiting and Anxiety    Current Outpatient Medications  Medication Sig Dispense Refill  . Acetylcysteine (N-ACETYL-L-CYSTEINE PO) Take 1 tablet by mouth daily.     . Ascorbic Acid (VITAMIN C)  1000 MG tablet Take 500 mg by mouth daily.     . Cholecalciferol (VITAMIN D-3) 125 MCG (5000 UT) TABS Take 10,000 Units by mouth daily.    . folic acid (FOLVITE) 1 MG tablet Take 1 tablet (1 mg total) by mouth daily. 30 tablet 4  . methocarbamol (ROBAXIN) 500 MG tablet Take by mouth. (Patient not taking: Reported on 05/09/2020)    . mupirocin ointment (BACTROBAN) 2 % Apply 1 application topically 2 (two) times daily. (Patient not taking: Reported on 03/11/2020) 30 g 0  . OVER THE COUNTER MEDICATION Take 1 capsule by mouth daily. Ultra Flora Probiotic     No current facility-administered medications for this visit.    Review of Systems  Constitutional: Positive for malaise/fatigue.  Respiratory: Negative.   Cardiovascular: Negative.   All other systems reviewed and are negative.    PHYSICAL EXAMINATION: BP 131/76 (BP Location: Right Arm, Patient Position: Sitting)   Pulse 86   Resp 20   Ht 5\' 4"  (1.626 m)   LMP 01/17/1994   SpO2 96% Comment: RA  BMI 36.39 kg/m  Physical Exam Constitutional:      General: She is not in acute distress.    Appearance: Normal appearance. She is obese. She is not ill-appearing.  Eyes:     Extraocular Movements: Extraocular movements intact.  Cardiovascular:     Rate and Rhythm: Normal rate.  Pulmonary:     Effort: Pulmonary effort is normal. No respiratory distress.  Musculoskeletal:        General: Normal range of motion.     Cervical back: Normal range of motion.  Skin:    General: Skin is warm and dry.  Neurological:     General: No focal deficit present.     Mental Status: She is alert and oriented to person, place, and time.     Diagnostic Studies & Laboratory data:     Recent Radiology Findings:   CT CHEST WO CONTRAST  Result Date: 08/29/2020 CLINICAL DATA:  Left lower lobe lung cancer status post surgery and chemotherapy. Restaging. EXAM: CT CHEST WITHOUT CONTRAST TECHNIQUE: Multidetector CT imaging of the chest was performed  following the standard protocol without IV contrast. COMPARISON:  07/19/2027 PET-CT. FINDINGS: Cardiovascular: Normal heart size. No significant pericardial effusion/thickening. Left anterior descending coronary atherosclerosis. Great vessels are normal in course and caliber. Mediastinum/Nodes: Soft tissue mass measuring 5.2 x 3.2 cm at the thoracic inlet to the left of midline (series 2/image  19), not substantially changed since 07/19/2019 PET-CT, potentially an exophytic inferior left thyroid nodule. Unremarkable esophagus. No pathologically enlarged axillary, mediastinal or hilar lymph nodes, noting limited sensitivity for the detection of hilar adenopathy on this noncontrast study. Lungs/Pleura: No pneumothorax. Status post left lower lobectomy. Trace loculated medial basilar left pleural effusion. No right pleural effusion. No acute consolidative airspace disease, lung masses or significant pulmonary nodules. Upper abdomen: Cholecystectomy. Musculoskeletal: No aggressive appearing focal osseous lesions. Mild thoracic spondylosis. IMPRESSION: 1. No evidence of local tumor recurrence status post left lower lobectomy. Trace loculated medial basilar left pleural effusion. 2. No evidence of metastatic disease in the chest. 3. Soft tissue mass measuring 5.2 x 3.2 cm at the thoracic inlet to the left of midline, not substantially changed since 07/19/2019 PET-CT, potentially an exophytic inferior left thyroid nodule. Recommend thyroid US (ref: J Am Coll Radiol. 2015 Feb;12(2): 143-50). 4. One vessel coronary atherosclerosis. Electronically Signed   By: Ilona Sorrel M.D.   On: 08/29/2020 12:48       I have independently reviewed the above radiology studies  and reviewed the findings with the patient.   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  From that standpoint she has done well.  I personally reviewed her cross-sectional imaging, and she has no evidence of recurrence.  On further review she has a stable left thyroid nodule which requires further evaluation.  I have referred her to general surgery to work-up this lesion.  I will order an ultrasound so they have something for review for her clinic appointment.  I will see her back in 6 months with a repeat chest x-ray for ongoing surveillance.     I  spent 20 minutes with  the patient face to face and greater then 50% of the time was spent in counseling and coordination of care.    [name] 08/29/2020 3:47 PM

## 2020-09-20 ENCOUNTER — Other Ambulatory Visit: Payer: Medicare Other

## 2020-10-08 ENCOUNTER — Other Ambulatory Visit: Payer: Self-pay | Admitting: Surgery

## 2020-10-08 DIAGNOSIS — E041 Nontoxic single thyroid nodule: Secondary | ICD-10-CM

## 2020-10-13 ENCOUNTER — Ambulatory Visit
Admission: RE | Admit: 2020-10-13 | Discharge: 2020-10-13 | Disposition: A | Discharge status: Home / Self Care | Payer: Medicare Other | Source: Ambulatory Visit | Attending: Surgery | Admitting: Surgery

## 2020-10-13 DIAGNOSIS — E041 Nontoxic single thyroid nodule: Secondary | ICD-10-CM

## 2020-10-23 ENCOUNTER — Other Ambulatory Visit: Payer: Self-pay | Admitting: Surgery

## 2020-10-23 DIAGNOSIS — E041 Nontoxic single thyroid nodule: Secondary | ICD-10-CM

## 2020-10-29 ENCOUNTER — Other Ambulatory Visit (HOSPITAL_COMMUNITY)
Admission: RE | Admit: 2020-10-29 | Discharge: 2020-10-29 | Disposition: A | Discharge status: Home / Self Care | Payer: BC Managed Care – PPO | Source: Ambulatory Visit | Attending: Surgery | Admitting: Surgery

## 2020-10-29 ENCOUNTER — Ambulatory Visit
Admission: RE | Admit: 2020-10-29 | Discharge: 2020-10-29 | Disposition: A | Discharge status: Home / Self Care | Payer: Medicare Other | Source: Ambulatory Visit | Attending: Surgery | Admitting: Surgery

## 2020-10-29 DIAGNOSIS — D34 Benign neoplasm of thyroid gland: Secondary | ICD-10-CM | POA: Diagnosis not present

## 2020-10-29 DIAGNOSIS — E041 Nontoxic single thyroid nodule: Secondary | ICD-10-CM

## 2020-10-30 LAB — CYTOLOGY - NON PAP

## 2020-12-26 ENCOUNTER — Telehealth: Payer: Self-pay

## 2020-12-26 ENCOUNTER — Telehealth: Payer: Self-pay | Admitting: Nurse Practitioner

## 2020-12-26 NOTE — Telephone Encounter (Signed)
Called to discuss with Sarah Lee about Covid symptoms and the use of the monoclonal antibody or antiviral infusion for those with mild to moderate Covid symptoms and at a high risk of hospitalization.     Patient reports symptom onset 12/23/20. She states her PCP has called her this morning and notified her that they have called in oral antiviral medication for her. We discussed at length and IV infusion. Patient declines and would like to take oral RX in the setting of symptoms getting better.    Patient Active Problem List   Diagnosis Date Noted  . 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 Encounter for antineoplastic chemotherapy 10/23/2019  . Goals of care, counseling/discussion 10/23/2019  . S/P thoracotomy 10/01/2019  . Lung cancer ([place]) 09/18/2019  . Pulmonary infiltrate present on computed tomography 07/11/2019  . Abdominal pain 07/11/2019  . Post ERCP Pancreatitis 10/30/2016  . Asthma   . GERD (gastroesophageal reflux disease)   . Choledocholithiasis s/p ERCP 10/29/2016   . Chronic cholecystitis with calculus s/p lap cholecystectomy 10/28/2016 10/28/2016  . Hemorrhoids 10/12/2012  . Obesity 05/20/2011  . Constipation 05/20/2011    Alda Lea, AGPCNP-BC Infusion Team

## 2020-12-26 NOTE — Telephone Encounter (Signed)
Called to discuss with patient about COVID-19 symptoms and the use of one of the available treatments for those with mild to moderate Covid symptoms and at a high risk of hospitalization.  Pt appears to qualify for outpatient treatment due to co-morbid conditions and/or a member of an at-risk group in accordance with the FDA Emergency Use Authorization.    Symptom onset: Fatigue,cough,headache 12/23/20 Vaccinated: No Booster? No Immunocompromised? Yes Qualifiers: Asthma,Lung cancer  Pt. Would like to speak with APP.   Marcello Moores

## 2021-02-27 ENCOUNTER — Ambulatory Visit
Admission: RE | Admit: 2021-02-27 | Discharge: 2021-02-27 | Disposition: A | Discharge status: Home / Self Care | Payer: Medicare Other | Source: Ambulatory Visit | Attending: Thoracic Surgery (Cardiothoracic Vascular Surgery) | Admitting: Thoracic Surgery (Cardiothoracic Vascular Surgery)

## 2021-02-27 DIAGNOSIS — C3492 Malignant neoplasm of unspecified part of left bronchus or lung: Secondary | ICD-10-CM

## 2021-03-02 ENCOUNTER — Other Ambulatory Visit: Payer: Self-pay

## 2021-03-02 ENCOUNTER — Telehealth (INDEPENDENT_AMBULATORY_CARE_PROVIDER_SITE_OTHER): Payer: BC Managed Care – PPO | Admitting: Thoracic Surgery (Cardiothoracic Vascular Surgery)

## 2021-03-02 DIAGNOSIS — C3492 Malignant neoplasm of unspecified part of left bronchus or lung: Secondary | ICD-10-CM

## 2021-03-02 NOTE — Progress Notes (Signed)
     [address]       [place] [address]             [phone]       Patient: Home Provider: Office Consent for Telemedicine visit obtained.  Today's visit was completed via a real-time telehealth (see specific modality noted below). The patient/authorized person provided oral consent at the time of the visit to engage in a telemedicine encounter with the present provider at [facility]. The patient/authorized person was informed of the potential benefits, limitations, and risks of telemedicine. The patient/authorized person expressed understanding that the laws that protect confidentiality also apply to telemedicine. The patient/authorized person acknowledged understanding that telemedicine does not provide emergency services and that he or she would need to call 911 or proceed to the nearest hospital for help if such a need arose.   Total time spent in the clinical discussion 10 minutes.  Telehealth Modality: Phone visit (audio only)  I had a telephone visit with Mrs. Scotti.  She is s/p L RATS, LLLectomy for T2bN0M0 Stage IIA NSCLC on 10/01/2019.  She underwent adjuvant therapy at [facility].  She was also noted to have a L thyroid nodule on her last scan  She denies any respiratory symptoms.  She does have some chronic back pain.  She was lost to follow up with the endocrine surgeon.  Based off of last note, the patient was supposed to follow-up in February, but she has no recollection of this.  I have made another referral for her to follow-up.  She will see me back in 6 months with a CT chest for ongoing surveillance of her lung cancer.  Artemisa Sladek Bary Leriche

## 2021-03-06 ENCOUNTER — Telehealth: Payer: Medicare Other | Admitting: Thoracic Surgery (Cardiothoracic Vascular Surgery)

## 2021-03-24 ENCOUNTER — Encounter: Payer: Self-pay | Admitting: Internal Medicine

## 2021-04-08 ENCOUNTER — Emergency Department (HOSPITAL_BASED_OUTPATIENT_CLINIC_OR_DEPARTMENT_OTHER): Payer: BC Managed Care – PPO | Admitting: Radiology

## 2021-04-08 ENCOUNTER — Emergency Department (HOSPITAL_BASED_OUTPATIENT_CLINIC_OR_DEPARTMENT_OTHER)
Admission: EM | Admit: 2021-04-08 | Discharge: 2021-04-08 | Disposition: A | Discharge status: Home / Self Care | Payer: BC Managed Care – PPO | Attending: Emergency Medicine | Admitting: Emergency Medicine

## 2021-04-08 ENCOUNTER — Emergency Department (HOSPITAL_BASED_OUTPATIENT_CLINIC_OR_DEPARTMENT_OTHER): Payer: BC Managed Care – PPO

## 2021-04-08 ENCOUNTER — Other Ambulatory Visit: Payer: Self-pay

## 2021-04-08 ENCOUNTER — Encounter (HOSPITAL_BASED_OUTPATIENT_CLINIC_OR_DEPARTMENT_OTHER): Payer: Self-pay | Admitting: Obstetrics and Gynecology

## 2021-04-08 DIAGNOSIS — S20212A Contusion of left front wall of thorax, initial encounter: Secondary | ICD-10-CM | POA: Insufficient documentation

## 2021-04-08 DIAGNOSIS — Z85118 Personal history of other malignant neoplasm of bronchus and lung: Secondary | ICD-10-CM | POA: Insufficient documentation

## 2021-04-08 DIAGNOSIS — J45909 Unspecified asthma, uncomplicated: Secondary | ICD-10-CM | POA: Diagnosis not present

## 2021-04-08 DIAGNOSIS — W01198A Fall on same level from slipping, tripping and stumbling with subsequent striking against other object, initial encounter: Secondary | ICD-10-CM | POA: Diagnosis not present

## 2021-04-08 DIAGNOSIS — M25532 Pain in left wrist: Secondary | ICD-10-CM | POA: Diagnosis not present

## 2021-04-08 DIAGNOSIS — S060X0A Concussion without loss of consciousness, initial encounter: Secondary | ICD-10-CM | POA: Diagnosis not present

## 2021-04-08 DIAGNOSIS — Z87891 Personal history of nicotine dependence: Secondary | ICD-10-CM | POA: Insufficient documentation

## 2021-04-08 DIAGNOSIS — S0990XA Unspecified injury of head, initial encounter: Secondary | ICD-10-CM | POA: Diagnosis present

## 2021-04-08 MED ORDER — ACETAMINOPHEN 500 MG PO TABS
1000.0000 mg | ORAL_TABLET | Freq: Once | ORAL | Status: AC
  Administered 2021-04-08: 1000 mg via ORAL
  Filled 2021-04-08: qty 2

## 2021-04-08 NOTE — ED Notes (Signed)
Pt ambulated to bathroom, gait steady

## 2021-04-08 NOTE — Discharge Instructions (Addendum)
The CAT scan of your head was normal without signs of bleeding.  However you do have a concussion and need plenty of rest.  Avoid any eyestrain or heavy exertion.  No sign of anything broken in your wrist or your ribs.  It is okay to take Tylenol as needed for the pain.

## 2021-04-08 NOTE — ED Notes (Signed)
Patient currently in Radiology Department. Patient Visitor comfortable at this time.

## 2021-04-08 NOTE — ED Triage Notes (Signed)
Patient reports she was walking to the store and tripped and fell on the sidewalk. Patinet states she hit her face and the left breast and it hurts when she raises her arm

## 2021-04-09 NOTE — ED Provider Notes (Signed)
[facility] EMERGENCY DEPT Provider Note   CSN: [id number] Arrival date & time: 04/08/21  1718     History Chief Complaint  Patient presents with   Sarah Lee    Sarah Lee is a 70 y.o. female.  Patient is a 70 year old female with a history of GERD, prior lung cancer with history of anemia who is presenting today after a fall.  She was walking and did not see the rise in the concrete and tripped falling forward hitting the left side of her face on the pavement, left ribs and left wrist.  She denies loss of consciousness but was dazed after the event and it took a while to clear.  She had a right-sided nosebleed but does not recall hitting her nose on the pavement.  She had some mild blurry vision and headache which she states now is improving.  She does not take any anticoagulation.  She denies any neck pain.  She was able to ambulate but felt generally weak and had to move slowly.  The history is provided by the patient.  Fall This is a new problem.      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   Encounter for antineoplastic chemotherapy 10/23/2019   Goals of care, counseling/discussion 10/23/2019   S/P thoracotomy 10/01/2019   Lung cancer ([place]) 09/18/2019   Pulmonary infiltrate present on computed tomography 07/11/2019   Abdominal pain 07/11/2019   Post ERCP Pancreatitis 10/30/2016   Asthma    GERD (gastroesophageal reflux disease)    Choledocholithiasis s/p ERCP  10/29/2016    Chronic cholecystitis with calculus s/p lap cholecystectomy 10/28/2016 10/28/2016   Hemorrhoids 10/12/2012   Obesity 05/20/2011   Constipation 05/20/2011    Past Surgical History:  Procedure Laterality Date   ABDOMINAL HYSTERECTOMY  2004   fibroids--- TAH/BSO   BREAST SURGERY     right breast biopsy-benign   CESAREAN SECTION     CHOLECYSTECTOMY N/A 10/28/2016   Procedure: LAPAROSCOPIC CHOLECYSTECTOMY WITH INTRAOPERATIVE CHOLANGIOGRAM;  Surgeon: Jackolyn Confer, MD;  [facility];  Service: General;  Laterality: N/A;   COLONOSCOPY     ERCP N/A 10/29/2016   Procedure: ENDOSCOPIC RETROGRADE CHOLANGIOPANCREATOGRAPHY (ERCP);  Surgeon: Doran Stabler, MD;  Location: [facility];  Service: Endoscopy;  Laterality: N/A;   MULTIPLE EXTRACTIONS WITH ALVEOLOPLASTY Bilateral 12/06/2019   Procedure: Extraction of tooth #'s 4, and 7-13 with alveoloplasty  and bilateral maxillary  tuberosity reductions;  Surgeon: Lenn Cal, DDS;  Location: [facility];  Service: Oral Surgery;  Laterality: Bilateral;   NODE DISSECTION Left 10/01/2019   Procedure: Node Dissection;  Surgeon: [name], MD;  Location: [facility];  Service: Thoracic;  Laterality: Left;   POLYPECTOMY     UTERINE FIBROID SURGERY     VIDEO BRONCHOSCOPY N/A 10/01/2019   Procedure:  VIDEO BRONCHOSCOPY;  Surgeon: [name], MD;  [facility];  Service: Thoracic;  Laterality: N/A;     OB History     Gravida  6   Para  2   Term  2   Preterm      AB  4   Living  2      SAB  4   IAB      Ectopic      Multiple      Live Births              Family History  Problem Relation Age of Onset   Cancer Father        prostate   Anemia Mother    Stomach cancer Cousin        maternal side    Stomach cancer Maternal Uncle    Colon cancer Neg Hx    Esophageal cancer Neg Hx    Rectal cancer Neg Hx    Colon polyps Neg Hx     Social History   Tobacco Use   Smoking status: Former     Packs/day: 1.50    Years: 25.00    Pack years: 37.50    Types: Cigarettes    Quit date: 03/02/1988    Years since quitting: 33.1   Smokeless tobacco: Never  Vaping Use   Vaping Use: Never used  Substance Use Topics   Alcohol use: Yes    Comment: wine occassionally   Drug use: Never    Home Medications Prior to Admission medications   Medication Sig Start Date End Date Taking? Authorizing Provider  Acetylcysteine (N-ACETYL-L-CYSTEINE PO) Take 1 tablet by mouth daily.     [provider]  Ascorbic Acid (VITAMIN C) 1000 MG tablet Take 500 mg by mouth daily.     [provider]  Cholecalciferol (VITAMIN D-3) 125 MCG (5000 UT) TABS Take 10,000 Units by mouth daily.    [provider]  folic acid (FOLVITE) 1 MG tablet Take 1 tablet (1 mg total) by mouth daily. 10/23/19   Curt Bears, MD  methocarbamol (ROBAXIN) 500 MG tablet Take by mouth. Patient not taking: Reported on 05/09/2020 03/25/20   [provider]  mupirocin ointment (BACTROBAN) 2 % Apply 1 application topically 2 (two) times daily. Patient not taking: Reported on 03/11/2020 12/14/19   Wieters, [employer] C, PA-C  OVER THE COUNTER MEDICATION Take 1 capsule by mouth daily. Ultra Flora Probiotic    [provider]    Allergies    Advil [ibuprofen], Iron, Penicillins, and Tetracyclines & related  Review of Systems   Review of Systems  All other systems reviewed and are negative.  Physical Exam Updated Vital Signs BP 130/66   Pulse 69   Temp 98.1 F (36.7 C) (Oral)   Resp 18   LMP 01/17/1994   SpO2 100%   Physical Exam Vitals and nursing note reviewed.  Constitutional:      General: She is not in acute distress.    Appearance: Normal appearance. She is well-developed.  HENT:     Head: Normocephalic and atraumatic.      Nose:     Comments: Left nare appears to have some dried blood but no evidence of laceration or active bleeding Eyes:     Pupils: Pupils are equal, round,  and reactive to light.  Cardiovascular:     Rate and Rhythm: Normal rate and regular rhythm.     Pulses: Normal pulses.  Heart sounds: Normal heart sounds. No murmur heard.   No friction rub.  Pulmonary:     Effort: Pulmonary effort is normal.     Breath sounds: Normal breath sounds. No wheezing or rales.  Chest:     Chest wall: Tenderness present.    Abdominal:     General: Bowel sounds are normal. There is no distension.     Palpations: Abdomen is soft.     Tenderness: There is no abdominal tenderness. There is no guarding or rebound.  Musculoskeletal:        General: Tenderness present. Normal range of motion.     Right wrist: Normal.     Left wrist: Bony tenderness present. No snuff box tenderness. Normal range of motion. Normal pulse.     Cervical back: Normal range of motion and neck supple. No tenderness.     Comments: No edema  Skin:    General: Skin is warm and dry.     Findings: No rash.  Neurological:     Mental Status: She is alert and oriented to person, place, and time. Mental status is at baseline.     Cranial Nerves: No cranial nerve deficit.     Sensory: No sensory deficit.     Motor: No weakness.  Psychiatric:        Mood and Affect: Mood normal.        Behavior: Behavior normal.        Thought Content: Thought content normal.    ED Results / Procedures / Treatments   Labs (all labs ordered are listed, but only abnormal results are displayed) Labs Reviewed - No data to display  EKG None  Radiology DG Ribs Unilateral W/Chest Left  Result Date: 04/08/2021 CLINICAL DATA:  Fall, pain EXAM: LEFT RIBS AND CHEST - 3+ VIEW COMPARISON:  Radiograph 02/27/2021, CT 08/29/2020 FINDINGS: No fracture or other bone lesions are seen involving the ribs. There is no evidence of pneumothorax or pleural effusion. Aside from stable left basilar scarring, both lungs are clear. Heart size and mediastinal contours are within normal limits. IMPRESSION: Negative.  Electronically Signed   By: Lovena Le M.D.   On: 04/08/2021 20:18   DG Wrist Complete Left  Result Date: 04/08/2021 CLINICAL DATA:  pain after fall EXAM: LEFT WRIST - COMPLETE 3+ VIEW COMPARISON:  None. FINDINGS: There is no evidence of acute fracture or dislocation. Normal alignment. There is mild triscaphe and first [facility] joint degenerative change. IMPRESSION: No evidence of left wrist fracture. Mild base of thumb degenerative change. Electronically Signed   By: Maurine Simmering   On: 04/08/2021 20:18   CT HEAD WO CONTRAST  Result Date: 04/08/2021 CLINICAL DATA:  Fall, head injury EXAM: CT HEAD WITHOUT CONTRAST TECHNIQUE: Contiguous axial images were obtained from the base of the skull through the vertex without intravenous contrast. COMPARISON:  MRI head 09/19/2019 FINDINGS: Brain: No evidence of acute infarction, hemorrhage, hydrocephalus, extra-axial collection or mass lesion/mass effect. Vascular: Negative for hyperdense vessel Skull: Negative Sinuses/Orbits: Negative Other: None IMPRESSION: No acute intracranial abnormality Electronically Signed   By: Franchot Gallo M.D.   On: 04/08/2021 18:44    Procedures Procedures   Medications Ordered in ED Medications  acetaminophen (TYLENOL) tablet 1,000 mg (1,000 mg Oral Given 04/08/21 2030)    ED Course  I have reviewed the triage vital signs and the nursing notes.  Pertinent labs & imaging results that were available during my care of the patient were reviewed by me and considered in  my medical decision making (see chart for details).    MDM Rules/Calculators/A&P                           Patient is a 70 year old female with a fall today when she tripped on uneven pavement.  She did hit the left side of her face with some mild abrasion and initially felt dazed but no LOC.  Patient is having symptoms of feeling some mild blurry vision, headache and feeling dazed but reports it all seems to be improving.  She is having pain in the left rib and  left wrist but imaging is negative.  Head CT is negative for acute bleed.  Patient's mentation is normal on neuro exam is normal at this time.  Will discharge home with information about concussion.  Patient instructed she can take Tylenol as needed for aches and pains.  MDM   Amount and/or Complexity of Data Reviewed Tests in the radiology section of CPT: ordered and reviewed Independent visualization of images, tracings, or specimens: yes    Final Clinical Impression(s) / ED Diagnoses Final diagnoses:  Concussion without loss of consciousness, initial encounter  Contusion of rib on left side, initial encounter    Rx / DC Orders ED Discharge Orders     None        Blanchie Dessert, MD 04/09/21 0028

## 2021-05-04 ENCOUNTER — Other Ambulatory Visit: Payer: Self-pay | Admitting: Surgery

## 2021-07-27 ENCOUNTER — Other Ambulatory Visit: Payer: Self-pay | Admitting: Thoracic Surgery (Cardiothoracic Vascular Surgery)

## 2021-07-27 DIAGNOSIS — C3492 Malignant neoplasm of unspecified part of left bronchus or lung: Secondary | ICD-10-CM

## 2021-08-28 ENCOUNTER — Other Ambulatory Visit: Payer: Self-pay

## 2021-08-28 ENCOUNTER — Ambulatory Visit (INDEPENDENT_AMBULATORY_CARE_PROVIDER_SITE_OTHER): Payer: BC Managed Care – PPO | Admitting: Thoracic Surgery (Cardiothoracic Vascular Surgery)

## 2021-08-28 ENCOUNTER — Ambulatory Visit
Admission: RE | Admit: 2021-08-28 | Discharge: 2021-08-28 | Disposition: A | Discharge status: Home / Self Care | Payer: BC Managed Care – PPO | Source: Ambulatory Visit | Attending: Thoracic Surgery (Cardiothoracic Vascular Surgery) | Admitting: Thoracic Surgery (Cardiothoracic Vascular Surgery)

## 2021-08-28 VITALS — BP 127/59 | HR 84 | Resp 20 | Ht 64.0 in | Wt 222.0 lb

## 2021-08-28 DIAGNOSIS — C3492 Malignant neoplasm of unspecified part of left bronchus or lung: Secondary | ICD-10-CM | POA: Diagnosis not present

## 2021-08-31 NOTE — Progress Notes (Signed)
° °   °  [address]       [place] [address]             [phone]        Sarah Lee [facility] Medical Record #[id number] Date of Birth: 11-Jun-1951  Referring: Tanda Rockers, MD Primary Care: Willey Blade, MD Primary Cardiologist:None  Reason for visit:   follow-up  History of Present Illness:     Sarah Lee comes in for her 57-month follow-up appointment.  She is status post left robotic assisted left lower lobectomy for a stage IIa non-small cell lung cancer in January 2021.  She completed her adjuvant therapy at [facility], and has not had any further follow-up with medical oncologist.  Overall she is doing well.  She denies any respiratory symptoms.  Her pain is under good control.  Physical Exam: BP (!) 127/59    Pulse 84    Resp 20    Ht 5\' 4"  (1.626 m)    Wt 222 lb (100.7 kg)    LMP 01/17/1994    SpO2 98% Comment: RA   BMI 38.11 kg/m   Alert NAD Incision well-healed.  Abdomen soft, ND no peripheral edema   Diagnostic Studies & Laboratory data: CT chest: IMPRESSION: 1. Status post left lower lobectomy. No evidence of recurrent or metastatic disease in the chest. 2. Trace left pleural effusion is decreased in size when compared to prior exam. 3.  Aortic Atherosclerosis (ICD10-I70.0).      Assessment / Plan:   70 year old female with history stage IIa non-small cell lung cancer.  She will be 2 years out from her original surgery since January.  I will see her back in 1 year with another CT chest.   [name] 08/31/2021 12:57 PM

## 2021-10-15 ENCOUNTER — Ambulatory Visit: Payer: BC Managed Care – PPO | Admitting: Cardiology

## 2021-10-15 ENCOUNTER — Encounter: Payer: Self-pay | Admitting: Internal Medicine

## 2021-10-15 NOTE — Progress Notes (Signed)
Date:  10/16/2021   ID:  TISH BEGIN, DOB [birth year], MRN [id number]  PCP:  Willey Blade, MD  Cardiologist:   Rex Kras, DO, [facility]  (established care 10/16/2021)  REASON FOR CONSULT: Palpitations  REQUESTING PHYSICIAN:  Willey Blade, [address] [place],  [place] [address]  Chief Complaint  Patient presents with   Palpitations   New Patient (Initial Visit)    HPI  Sarah Lee is a 71 y.o. African-American female who presents to the office with a chief complaint of " palpitations which." Patient's past medical history and cardiovascular risk factors include: Former smoker, vitamin D deficiency, history of lung cancer (in remission).   She is referred to the office at the request of Willey Blade, MD for evaluation of palpitations.  Palpitations: Patient states that she been having fluttering like sensation in the chest for the last couple years however over the last month it appears to be more progressive.  More pronounced when she is resting or after activity.  Symptoms last for minutes and is self-limited.  No improving or worsening factors.  No associated symptoms of lightheadedness, dizziness, near-syncope or syncopal events.  She does not consume coffee, soda, smoking, illicit drugs, no new OTC, no stimulants, no energy drinks.  She used to be on herbal supplements but stopped about 3 months ago.  No structured exercise program or daily routine.  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  FUNCTIONAL STATUS: No structured exercise program or daily routine.   ALLERGIES: Allergies  Allergen Reactions   Advil [Ibuprofen] Other (See Comments)    Rectal bleeding    Iron Other (See Comments)    Extreme constipation   Penicillins Rash    Did it involve swelling of the face/tongue/throat, SOB, or low BP? No Did it involve sudden or severe rash/hives, skin peeling, or any  reaction on the inside of your mouth or nose? No Did you need to seek medical attention at a hospital or doctor's office? No When did it last happen?~20 years or so If all above answers are NO, may proceed with cephalosporin use.    Tetracyclines & Related Nausea And Vomiting and Anxiety    MEDICATION LIST PRIOR TO VISIT: Current Meds  Medication Sig   Acetylcysteine (N-ACETYL-L-CYSTEINE PO) Take 1 tablet by mouth daily.    Ascorbic Acid (VITAMIN C) 1000 MG tablet Take 500 mg by mouth daily.    Cholecalciferol (VITAMIN D-3) 125 MCG (5000 UT) TABS Take 10,000 Units by mouth daily.   Cyanocobalamin (VITAMIN B 12 PO) Take 1 tablet by mouth daily at 12 noon.   Omega-3 Fatty Acids (OMEGA-3 FISH OIL PO) Take by mouth.     PAST MEDICAL HISTORY: Past Medical History:  Diagnosis Date   Allergy    Anemia    Arthritis    Asthma    as a child   Blood transfusion    Cancer ([place])    lung cancer   Cataract    slight   Complication of anesthesia    used sodium pentothal and very hard to wake up and arm swelled up from medication   Constipation    Gallstones    GERD (gastroesophageal reflux disease)    Heart murmur    no issues   Pneumonia    Seasonal allergies     PAST SURGICAL HISTORY: Past Surgical History:  Procedure Laterality Date   ABDOMINAL  HYSTERECTOMY  2004   fibroids--- TAH/BSO   BREAST SURGERY     right breast biopsy-benign   CESAREAN SECTION     CHOLECYSTECTOMY N/A 10/28/2016   Procedure: LAPAROSCOPIC CHOLECYSTECTOMY WITH INTRAOPERATIVE CHOLANGIOGRAM;  Surgeon: Jackolyn Confer, MD;  Location: [facility];  Service: General;  Laterality: N/A;   COLONOSCOPY     ERCP N/A 10/29/2016   Procedure: ENDOSCOPIC RETROGRADE CHOLANGIOPANCREATOGRAPHY (ERCP);  Surgeon: Doran Stabler, MD;  Location: [facility];  Service: Endoscopy;  Laterality: N/A;   LUNG LOBECTOMY     MULTIPLE EXTRACTIONS WITH ALVEOLOPLASTY Bilateral 12/06/2019   Procedure: Extraction of tooth #'s 4, and  7-13 with alveoloplasty  and bilateral maxillary  tuberosity reductions;  Surgeon: Lenn Cal, DDS;  Location: [place];  Service: Oral Surgery;  Laterality: Bilateral;   NODE DISSECTION Left 10/01/2019   Procedure: Node Dissection;  Surgeon: [name], MD;  Location: [place];  Service: Thoracic;  Laterality: Left;   POLYPECTOMY     UTERINE FIBROID SURGERY     VIDEO BRONCHOSCOPY N/A 10/01/2019   Procedure: VIDEO BRONCHOSCOPY;  Surgeon: [name], MD;  Location: [facility];  Service: Thoracic;  Laterality: N/A;    FAMILY HISTORY: The patient family history includes Anemia in her mother; Cancer in her father; Stomach cancer in her cousin and maternal uncle.  SOCIAL HISTORY:  The patient  reports that she quit smoking about 33 years ago. Her smoking use included cigarettes. She has a 37.50 pack-year smoking history. She has never used smokeless tobacco. She reports current alcohol use. She reports that she does not use drugs.  REVIEW OF SYSTEMS: Review of Systems  Cardiovascular:  Positive for palpitations. Negative for chest pain, dyspnea on exertion, leg swelling, near-syncope, orthopnea, paroxysmal nocturnal dyspnea and syncope.   PHYSICAL EXAM: Vitals with BMI 10/16/2021 08/28/2021 04/08/2021  Height 5' 4" 5' 4" -  Weight 221 lbs 222 lbs -  BMI 16.10 96.04 -  Systolic 540 981 191  Diastolic 64 59 66  Pulse 66 84 69    CONSTITUTIONAL: Well-developed and well-nourished. No acute distress.  SKIN: Skin is warm and dry. No rash noted. No cyanosis. No pallor. No jaundice HEAD: Normocephalic and atraumatic.  EYES: No scleral icterus MOUTH/THROAT: Moist oral membranes.  NECK: No JVD present. No thyromegaly noted. No carotid bruits  LYMPHATIC: No visible cervical adenopathy.  CHEST Normal respiratory effort. No intercostal retractions  LUNGS: Clear to auscultation bilaterally.  No stridor. No wheezes. No rales.  CARDIOVASCULAR: Regular rate and rhythm, positive S1-S2,  no murmurs rubs or gallops appreciated. ABDOMINAL: Obese, soft, nontender, nondistended, positive bowel sounds all 4 quadrants. No apparent ascites.  EXTREMITIES: No peripheral edema, warm to touch, 2+ bilateral DP and +1 bilateral PT pulses HEMATOLOGIC: No significant bruising NEUROLOGIC: Oriented to person, place, and time. Nonfocal. Normal muscle tone.  PSYCHIATRIC: Normal mood and affect. Normal behavior. Cooperative  CARDIAC DATABASE: EKG: 07/30/2021 (provided by referring physician) normal sinus rhythm, 64 bpm, without underlying ischemia or injury pattern.  10/16/2021: NSR, 64 bpm, without underlying ischemia or injury pattern.  Echocardiogram: No results found for this or any previous visit from the past 1095 days.    Stress Testing: 10/14/2019: Nuclear stress EF: 72%. The left ventricular ejection fraction is hyperdynamic (>65%). There was no ST segment deviation noted during stress. Defect 1: There is a small defect of moderate severity present in the apical inferior, apical lateral and apex location. This is most c/w chest and diaphragmatic attenuation artifact. This is  a low risk study. There is no evidence of ischemia or previous infarction. There is hyperdynamic left ventricular systolic function. The study is normal.  Heart Catheterization: None  LABORATORY DATA: CBC Latest Ref Rng & Units 03/12/2020 01/15/2020 01/01/2020  WBC 4.0 - 10.5 K/uL 2.8(L) 5.9 2.7(L)  Hemoglobin 12.0 - 15.0 g/dL 8.6(L) 10.9(L) 11.3(L)  Hematocrit 36.0 - 46.0 % 26.2(L) 34.1(L) 34.2(L)  Platelets 150 - 400 K/uL 62(L) 231 166    CMP Latest Ref Rng & Units 03/12/2020 01/15/2020 01/01/2020  Glucose 70 - 99 mg/dL 77 93 90  BUN 8 - 23 mg/dL _0 Creatinine 0.44 - 1.00 mg/dL 0.80 1.04(H) 1.21(H)  Sodium 135 - 145 mmol/L 141 141 143  Potassium 3.5 - 5.1 mmol/L 4.0 3.8 4.2  Chloride 98 - 111 mmol/L 108 107 107  CO2 22 - 32 mmol/L _1 Calcium 8.9 - 10.3 mg/dL 9.5 9.3 9.3  Total Protein 6.5  - 8.1 g/dL 6.8 6.7 6.4(L)  Total Bilirubin 0.3 - 1.2 mg/dL 0.5 0.4 0.5  Alkaline Phos 38 - 126 U/L 62 77 71  AST 15 - 41 U/L _2 ALT 0 - 44 U/L 36 23 23    Lipid Panel     Component Value Date/Time   CHOL 200 02/14/2014 1117   TRIG 140.0 02/14/2014 1117   HDL 55.00 02/14/2014 1117   CHOLHDL 4 02/14/2014 1117   VLDL 28.0 02/14/2014 1117   LDLCALC 117 (H) 02/14/2014 1117    No components found for: NTPROBNP No results for input(s): PROBNP in the last 8760 hours. No results for input(s): TSH in the last 8760 hours.  BMP No results for input(s): NA, K, CL, CO2, GLUCOSE, BUN, CREATININE, CALCIUM, GFRNONAA, GFRAA in the last 8760 hours.  HEMOGLOBIN A1C No results found for: HGBA1C, MPG  External Labs:  Date Collected: 08/03/2021 , information obtained by referring physician Potassium: 3.9 Creatinine 0.88 mg/dL. eGFR: 70 mL/min per 1.73 m Hemoglobin: 12.4 g/dL and hematocrit: 37.9 % Lipid profile: Total cholesterol 194 , triglycerides 68 , HDL 71 , LDL 111 AST: 22 , ALT: 18 , alkaline phosphatase: 97  TSH: 0.817 (within normal limits)  IMPRESSION:    ICD-10-CM   1. Palpitations  R00.2 EKG 12-Lead    LONG TERM MONITOR (3-14 DAYS)       RECOMMENDATIONS: Sarah Lee is a 71 y.o. African-American female whose past medical history and cardiac risk factors include: Former smoker, vitamin D deficiency, history of lung cancer (in remission).   Palpitations No identifiable reversible cause. Chronic but progressive. Hemoglobin and TSH within normal limits. Probability of venous thromboembolic event-a low for history. EKG: Normal sinus rhythm without underlying ischemia or injury pattern. 14-day extended Holter monitor to evaluate for dysrhythmias. Further recommendations to follow.  Records reviewed as part of today's consultation report: External referring physician note (07/2021), external labs, external EKG dated 07/2021).  Discussed management of her chronic  but progressive condition, reviewed external notes provided by referring physician, independent interpretation of labs and EKG provided by the referring physician, ordering diagnostic testing, discussing disease management and follow-up.  FINAL MEDICATION LIST END OF ENCOUNTER: No orders of the defined types were placed in this encounter.   Medications Discontinued During This Encounter  Medication Reason   OVER THE COUNTER MEDICATION      Current Outpatient Medications:    Acetylcysteine (N-ACETYL-L-CYSTEINE PO), Take 1 tablet by mouth daily. , Disp: , Rfl:    Ascorbic Acid (VITAMIN  C) 1000 MG tablet, Take 500 mg by mouth daily. , Disp: , Rfl:    Cholecalciferol (VITAMIN D-3) 125 MCG (5000 UT) TABS, Take 10,000 Units by mouth daily., Disp: , Rfl:    Cyanocobalamin (VITAMIN B 12 PO), Take 1 tablet by mouth daily at 12 noon., Disp: , Rfl:    Omega-3 Fatty Acids (OMEGA-3 FISH OIL PO), Take by mouth., Disp: , Rfl:   Orders Placed This Encounter  Procedures   LONG TERM MONITOR (3-14 DAYS)   EKG 12-Lead    There are no Patient Instructions on file for this visit.   --Continue cardiac medications as reconciled in final medication list. --Return in about 4 weeks (around 11/13/2021) for Follow up, Palpitations. Or sooner if needed. --Continue follow-up with your primary care physician regarding the management of your other chronic comorbid conditions.  Patient's questions and concerns were addressed to her satisfaction. She voices understanding of the instructions provided during this encounter.   This note was created using a voice recognition software as a result there may be grammatical errors inadvertently enclosed that do not reflect the nature of this encounter. Every attempt is made to correct such errors.  Rex Kras, [place], [facility]  Pager: [phone] Office: [phone]

## 2021-10-16 ENCOUNTER — Other Ambulatory Visit: Payer: Self-pay

## 2021-10-16 ENCOUNTER — Encounter: Payer: Self-pay | Admitting: Cardiology

## 2021-10-16 ENCOUNTER — Ambulatory Visit: Payer: BC Managed Care – PPO | Admitting: Cardiology

## 2021-10-16 ENCOUNTER — Encounter: Payer: Self-pay | Admitting: Internal Medicine

## 2021-10-16 ENCOUNTER — Inpatient Hospital Stay: Payer: BC Managed Care – PPO

## 2021-10-16 VITALS — BP 110/64 | HR 66 | Temp 97.5°F | Resp 16 | Ht 64.0 in | Wt 221.0 lb

## 2021-10-16 DIAGNOSIS — R002 Palpitations: Secondary | ICD-10-CM

## 2021-11-13 ENCOUNTER — Ambulatory Visit: Payer: BC Managed Care – PPO | Admitting: Cardiology

## 2021-11-19 DIAGNOSIS — Z1231 Encounter for screening mammogram for malignant neoplasm of breast: Secondary | ICD-10-CM

## 2021-11-20 ENCOUNTER — Other Ambulatory Visit: Payer: Self-pay

## 2021-11-20 ENCOUNTER — Encounter: Payer: Self-pay | Admitting: Cardiology

## 2021-11-20 ENCOUNTER — Ambulatory Visit: Payer: BC Managed Care – PPO | Admitting: Cardiology

## 2021-11-20 VITALS — BP 117/76 | HR 79 | Temp 97.6°F | Resp 16 | Ht 64.0 in | Wt 221.0 lb

## 2021-11-20 DIAGNOSIS — I471 Supraventricular tachycardia: Secondary | ICD-10-CM

## 2021-11-20 DIAGNOSIS — R002 Palpitations: Secondary | ICD-10-CM

## 2021-11-20 DIAGNOSIS — C3490 Malignant neoplasm of unspecified part of unspecified bronchus or lung: Secondary | ICD-10-CM

## 2021-11-20 MED ORDER — DILTIAZEM HCL ER COATED BEADS 120 MG PO CP24: 120.0000 mg | ORAL_CAPSULE | Freq: Every day | ORAL | 0 refills | Status: DC

## 2021-11-20 NOTE — Progress Notes (Signed)
Date:  11/20/2021   ID:  Sarah Lee, DOB 08-26-1951, MRN [id number]  PCP:  Willey Blade, MD  Cardiologist:   Rex Kras, DO, [facility]  (established care 10/16/2021)  Date: 11/20/21 Last Office Visit: 10/16/2021  Chief Complaint  Patient presents with   Palpitations    HPI  Sarah Lee is a 71 y.o. African-American female who presents to the office with a chief complaint of " reevaluation of palpitations." Patient's past medical history and cardiovascular risk factors include: Former smoker, vitamin D deficiency, history of lung cancer (in remission).   She is referred to the office at the request of Willey Blade, MD for evaluation of palpitations.  She was referred to the practice by her primary care provider for evaluation of palpitations.  Symptoms have been chronic but progressive and therefore was recommended to undergo 14-day extended Holter monitor to evaluate for underlying dysrhythmias.  She is noted to have normal sinus rhythm without any evidence of atrial fibrillation during the monitoring period.  She has intermittent episodes of PSVT which are short-lived and total supraventricular and ventricular burden is less than 1%.  Patient states that the symptoms are still present but the intensity, frequency, and duration has remained stable.  She denies any lightheadedness, dizziness, near-syncope or syncopal events.  No structured exercise program or daily routine.  She continues to work on Catering manager.  FUNCTIONAL STATUS: No structured exercise program or daily routine.   ALLERGIES: Allergies  Allergen Reactions   Advil [Ibuprofen] Other (See Comments)    Rectal bleeding    Iron Other (See Comments)    Extreme constipation   Penicillins Rash    Did it involve swelling of the face/tongue/throat, SOB, or low BP? No Did it involve sudden or severe rash/hives, skin peeling, or any reaction on the inside of your mouth or nose? No Did you need to seek medical  attention at a hospital or doctor's office? No When did it last happen?~20 years or so If all above answers are NO, may proceed with cephalosporin use.    Tetracyclines & Related Nausea And Vomiting and Anxiety    MEDICATION LIST PRIOR TO VISIT: Current Meds  Medication Sig   Acetylcysteine (N-ACETYL-L-CYSTEINE PO) Take 1 tablet by mouth daily.    b complex vitamins capsule Take 1 capsule by mouth daily.   Cholecalciferol (VITAMIN D-3) 125 MCG (5000 UT) TABS Take 10,000 Units by mouth daily.   diltiazem (CARDIZEM CD) 120 MG 24 hr capsule Take 1 capsule (120 mg total) by mouth daily.   Omega-3 Fatty Acids (OMEGA-3 FISH OIL PO) Take by mouth.     PAST MEDICAL HISTORY: Past Medical History:  Diagnosis Date   Allergy    Anemia    Arthritis    Asthma    as a child   Blood transfusion    Cancer ([place])    lung cancer   Cataract    slight   Complication of anesthesia    used sodium pentothal and very hard to wake up and arm swelled up from medication   Constipation    Gallstones    GERD (gastroesophageal reflux disease)    Heart murmur    no issues   Pneumonia    Seasonal allergies     PAST SURGICAL HISTORY: Past Surgical History:  Procedure Laterality Date   ABDOMINAL HYSTERECTOMY  2004   fibroids--- TAH/BSO   BREAST SURGERY     right breast biopsy-benign   CESAREAN SECTION  CHOLECYSTECTOMY N/A 10/28/2016   Procedure: LAPAROSCOPIC CHOLECYSTECTOMY WITH INTRAOPERATIVE CHOLANGIOGRAM;  Surgeon: Jackolyn Confer, MD;  Location: [facility];  Service: General;  Laterality: N/A;   COLONOSCOPY     ERCP N/A 10/29/2016   Procedure: ENDOSCOPIC RETROGRADE CHOLANGIOPANCREATOGRAPHY (ERCP);  Surgeon: Doran Stabler, MD;  Location: [facility];  Service: Endoscopy;  Laterality: N/A;   LUNG LOBECTOMY     MULTIPLE EXTRACTIONS WITH ALVEOLOPLASTY Bilateral 12/06/2019   Procedure: Extraction of tooth #'s 4, and 7-13 with alveoloplasty  and bilateral maxillary  tuberosity reductions;   Surgeon: Lenn Cal, DDS;  Location: [place];  Service: Oral Surgery;  Laterality: Bilateral;   NODE DISSECTION Left 10/01/2019   Procedure: Node Dissection;  Surgeon: [name], MD;  Location: [place];  Service: Thoracic;  Laterality: Left;   POLYPECTOMY     UTERINE FIBROID SURGERY     VIDEO BRONCHOSCOPY N/A 10/01/2019   Procedure: VIDEO BRONCHOSCOPY;  Surgeon: [name], MD;  Location: [facility];  Service: Thoracic;  Laterality: N/A;    FAMILY HISTORY: The patient family history includes Anemia in her mother; Cancer in her father; Stomach cancer in her cousin and maternal uncle.  SOCIAL HISTORY:  The patient  reports that she quit smoking about 33 years ago. Her smoking use included cigarettes. She has a 37.50 pack-year smoking history. She has never used smokeless tobacco. She reports current alcohol use. She reports that she does not use drugs.  REVIEW OF SYSTEMS: Review of Systems  Cardiovascular:  Positive for palpitations. Negative for chest pain, dyspnea on exertion, leg swelling, near-syncope, orthopnea, paroxysmal nocturnal dyspnea and syncope.   PHYSICAL EXAM: Vitals with BMI 11/20/2021 10/16/2021 08/28/2021  Height 5' 4"  5' 4"  5' 4"   Weight 221 lbs 221 lbs 222 lbs  BMI 37.92 62.26 33.35  Systolic 456 256 389  Diastolic 76 64 59  Pulse 79 66 84    CONSTITUTIONAL: Well-developed and well-nourished. No acute distress.  SKIN: Skin is warm and dry. No rash noted. No cyanosis. No pallor. No jaundice HEAD: Normocephalic and atraumatic.  EYES: No scleral icterus MOUTH/THROAT: Moist oral membranes.  NECK: No JVD present. No thyromegaly noted. No carotid bruits  LYMPHATIC: No visible cervical adenopathy.  CHEST Normal respiratory effort. No intercostal retractions  LUNGS: Clear to auscultation bilaterally.  No stridor. No wheezes. No rales.  CARDIOVASCULAR: Regular rate and rhythm, positive S1-S2, no murmurs rubs or gallops appreciated. ABDOMINAL: Obese,  soft, nontender, nondistended, positive bowel sounds all 4 quadrants. No apparent ascites.  EXTREMITIES: No peripheral edema, warm to touch, 2+ bilateral DP and +1 bilateral PT pulses HEMATOLOGIC: No significant bruising NEUROLOGIC: Oriented to person, place, and time. Nonfocal. Normal muscle tone.  PSYCHIATRIC: Normal mood and affect. Normal behavior. Cooperative  CARDIAC DATABASE: EKG: 07/30/2021 (provided by referring physician) normal sinus rhythm, 64 bpm, without underlying ischemia or injury pattern.  10/16/2021: NSR, 64 bpm, without underlying ischemia or injury pattern.  Echocardiogram: No results found for this or any previous visit from the past 1095 days.    Stress Testing: 10/14/2019: Nuclear stress EF: 72%. The left ventricular ejection fraction is hyperdynamic (>65%). There was no ST segment deviation noted during stress. Defect 1: There is a small defect of moderate severity present in the apical inferior, apical lateral and apex location. This is most c/w chest and diaphragmatic attenuation artifact. This is a low risk study. There is no evidence of ischemia or previous infarction. There is hyperdynamic left ventricular systolic function. The study is normal.  Heart Catheterization: None  14 day extended Holter monitor: October 16, 2021- October 30, 2021 Dominant rhythm normal sinus rhythm. Heart rate 49-169 bpm.  Avg HR 77 bpm. No atrial fibrillation, ventricular tachycardia, high grade AV block, pauses (3 seconds or longer).  Total ventricular ectopic burden less than 1%. Asymptomatic, rare episodes of PSVT, total supraventricular ectopic burden <1%. Patient triggered events: 2.  These did not correlate with any significant dysrhythmias.  LABORATORY DATA: CBC Latest Ref Rng & Units 03/12/2020 01/15/2020 01/01/2020  WBC 4.0 - 10.5 K/uL 2.8(L) 5.9 2.7(L)  Hemoglobin 12.0 - 15.0 g/dL 8.6(L) 10.9(L) 11.3(L)  Hematocrit 36.0 - 46.0 % 26.2(L) 34.1(L) 34.2(L)  Platelets  150 - 400 K/uL 62(L) 231 166    CMP Latest Ref Rng & Units 03/12/2020 01/15/2020 01/01/2020  Glucose 70 - 99 mg/dL 77 93 90  BUN 8 - 23 mg/dL 22 15 18   Creatinine 0.44 - 1.00 mg/dL 0.80 1.04(H) 1.21(H)  Sodium 135 - 145 mmol/L 141 141 143  Potassium 3.5 - 5.1 mmol/L 4.0 3.8 4.2  Chloride 98 - 111 mmol/L 108 107 107  CO2 22 - 32 mmol/L 26 27 25   Calcium 8.9 - 10.3 mg/dL 9.5 9.3 9.3  Total Protein 6.5 - 8.1 g/dL 6.8 6.7 6.4(L)  Total Bilirubin 0.3 - 1.2 mg/dL 0.5 0.4 0.5  Alkaline Phos 38 - 126 U/L 62 77 71  AST 15 - 41 U/L 27 24 19   ALT 0 - 44 U/L 36 23 23    Lipid Panel     Component Value Date/Time   CHOL 200 02/14/2014 1117   TRIG 140.0 02/14/2014 1117   HDL 55.00 02/14/2014 1117   CHOLHDL 4 02/14/2014 1117   VLDL 28.0 02/14/2014 1117   LDLCALC 117 (H) 02/14/2014 1117    No components found for: NTPROBNP No results for input(s): PROBNP in the last 8760 hours. No results for input(s): TSH in the last 8760 hours.  BMP No results for input(s): NA, K, CL, CO2, GLUCOSE, BUN, CREATININE, CALCIUM, GFRNONAA, GFRAA in the last 8760 hours.  HEMOGLOBIN A1C No results found for: HGBA1C, MPG  External Labs:  Date Collected: 08/03/2021 , information obtained by referring physician Potassium: 3.9 Creatinine 0.88 mg/dL. eGFR: 70 mL/min per 1.73 m Hemoglobin: 12.4 g/dL and hematocrit: 37.9 % Lipid profile: Total cholesterol 194 , triglycerides 68 , HDL 71 , LDL 111 AST: 22 , ALT: 18 , alkaline phosphatase: 97  TSH: 0.817 (within normal limits)  IMPRESSION:    ICD-10-CM   1. Palpitations  R00.2 diltiazem (CARDIZEM CD) 120 MG 24 hr capsule    2. PSVT (paroxysmal supraventricular tachycardia) (HCC)  I47.1 diltiazem (CARDIZEM CD) 120 MG 24 hr capsule    PCV ECHOCARDIOGRAM COMPLETE    3. History of lung cancer status post lobectomy  C34.90 PCV ECHOCARDIOGRAM COMPLETE       RECOMMENDATIONS: AARIONA MOMON is a 71 y.o. African-American female whose past medical history and  cardiac risk factors include: Former smoker, vitamin D deficiency, history of lung cancer (in remission).   Palpitations No identifiable reversible cause. Chronic, but progressive. Hemoglobin and TSH within normal limits. EKG: Normal sinus rhythm without underlying ischemia or injury pattern. 14-day extended Holter monitor results reviewed with the patient at today's office visit and noted above for further reference.  Essentially within acceptable limits has intermittent episodes of PSVT.  Recommended either conservative management versus low-dose pharmacological therapy.  Patient would like to consider pharmacological therapy for now.  Start Cardizem 120 mg p.o.  daily.  Echo will be ordered to evaluate for structural heart disease and left ventricular systolic function.  PSVT: See above   FINAL MEDICATION LIST END OF ENCOUNTER: Meds ordered this encounter  Medications   diltiazem (CARDIZEM CD) 120 MG 24 hr capsule    Sig: Take 1 capsule (120 mg total) by mouth daily.    Dispense:  30 capsule    Refill:  0    Medications Discontinued During This Encounter  Medication Reason   Ascorbic Acid (VITAMIN C) 1000 MG tablet No longer needed (for PRN medications)   Cyanocobalamin (VITAMIN B 12 PO) No longer needed (for PRN medications)     Current Outpatient Medications:    Acetylcysteine (N-ACETYL-L-CYSTEINE PO), Take 1 tablet by mouth daily. , Disp: , Rfl:    b complex vitamins capsule, Take 1 capsule by mouth daily., Disp: , Rfl:    Cholecalciferol (VITAMIN D-3) 125 MCG (5000 UT) TABS, Take 10,000 Units by mouth daily., Disp: , Rfl:    diltiazem (CARDIZEM CD) 120 MG 24 hr capsule, Take 1 capsule (120 mg total) by mouth daily., Disp: 30 capsule, Rfl: 0   Omega-3 Fatty Acids (OMEGA-3 FISH OIL PO), Take by mouth., Disp: , Rfl:   Orders Placed This Encounter  Procedures   PCV ECHOCARDIOGRAM COMPLETE    There are no Patient Instructions on file for this visit.   --Continue cardiac  medications as reconciled in final medication list. --Return in about 6 months (around 05/23/2022) for Follow up, Palpitations. Or sooner if needed. --Continue follow-up with your primary care physician regarding the management of your other chronic comorbid conditions.  Patient's questions and concerns were addressed to her satisfaction. She voices understanding of the instructions provided during this encounter.   This note was created using a voice recognition software as a result there may be grammatical errors inadvertently enclosed that do not reflect the nature of this encounter. Every attempt is made to correct such errors.  Total time spent: 30 minutes.  Rex Kras, [place], [facility]  Pager: [phone] Office: [phone]

## 2021-11-26 ENCOUNTER — Other Ambulatory Visit: Payer: Self-pay

## 2021-11-26 ENCOUNTER — Ambulatory Visit: Payer: BC Managed Care – PPO

## 2021-11-26 DIAGNOSIS — I471 Supraventricular tachycardia: Secondary | ICD-10-CM

## 2021-11-29 ENCOUNTER — Emergency Department (HOSPITAL_COMMUNITY)
Admission: EM | Admit: 2021-11-29 | Discharge: 2021-11-29 | Disposition: A | Discharge status: Home / Self Care | Payer: BC Managed Care – PPO | Attending: Emergency Medicine | Admitting: Emergency Medicine

## 2021-11-29 ENCOUNTER — Other Ambulatory Visit: Payer: Self-pay

## 2021-11-29 ENCOUNTER — Emergency Department (HOSPITAL_COMMUNITY): Payer: BC Managed Care – PPO

## 2021-11-29 ENCOUNTER — Encounter (HOSPITAL_COMMUNITY): Payer: Self-pay

## 2021-11-29 DIAGNOSIS — N200 Calculus of kidney: Secondary | ICD-10-CM | POA: Diagnosis not present

## 2021-11-29 DIAGNOSIS — Z85118 Personal history of other malignant neoplasm of bronchus and lung: Secondary | ICD-10-CM | POA: Diagnosis not present

## 2021-11-29 DIAGNOSIS — R1031 Right lower quadrant pain: Secondary | ICD-10-CM | POA: Diagnosis present

## 2021-11-29 LAB — URINALYSIS, ROUTINE W REFLEX MICROSCOPIC
Bilirubin Urine: NEGATIVE
Glucose, UA: NEGATIVE mg/dL
Ketones, ur: NEGATIVE mg/dL
Nitrite: NEGATIVE
Protein, ur: NEGATIVE mg/dL
Specific Gravity, Urine: 1.018 (ref 1.005–1.030)
pH: 5 (ref 5.0–8.0)

## 2021-11-29 LAB — CBC
HCT: 38.4 % (ref 36.0–46.0)
Hemoglobin: 12.8 g/dL (ref 12.0–15.0)
MCH: 30 pg (ref 26.0–34.0)
MCHC: 33.3 g/dL (ref 30.0–36.0)
MCV: 90.1 fL (ref 80.0–100.0)
Platelets: 229 10*3/uL (ref 150–400)
RBC: 4.26 MIL/uL (ref 3.87–5.11)
RDW: 13 % (ref 11.5–15.5)
WBC: 7.6 10*3/uL (ref 4.0–10.5)
nRBC: 0 % (ref 0.0–0.2)

## 2021-11-29 LAB — COMPREHENSIVE METABOLIC PANEL
ALT: 22 U/L (ref 0–44)
AST: 27 U/L (ref 15–41)
Albumin: 3.5 g/dL (ref 3.5–5.0)
Alkaline Phosphatase: 78 U/L (ref 38–126)
Anion gap: 9 (ref 5–15)
BUN: 18 mg/dL (ref 8–23)
CO2: 25 mmol/L (ref 22–32)
Calcium: 9.2 mg/dL (ref 8.9–10.3)
Chloride: 102 mmol/L (ref 98–111)
Creatinine, Ser: 0.96 mg/dL (ref 0.44–1.00)
GFR, Estimated: >60 mL/min (ref >60)
Glucose, Bld: 120 mg/dL — ABNORMAL HIGH (ref 70–99)
Potassium: 3.3 mmol/L — ABNORMAL LOW (ref 3.5–5.1)
Sodium: 136 mmol/L (ref 135–145)
Total Bilirubin: 0.7 mg/dL (ref 0.3–1.2)
Total Protein: 6.5 g/dL (ref 6.5–8.1)

## 2021-11-29 LAB — MAGNESIUM: Magnesium: 2 mg/dL (ref 1.7–2.4)

## 2021-11-29 LAB — LIPASE, BLOOD: Lipase: 29 U/L (ref 11–51)

## 2021-11-29 MED ORDER — HYDROMORPHONE HCL 1 MG/ML IJ SOLN
1.0000 mg | Freq: Once | INTRAMUSCULAR | Status: AC
  Administered 2021-11-29: 1 mg via INTRAVENOUS
  Filled 2021-11-29: qty 1

## 2021-11-29 MED ORDER — KETOROLAC TROMETHAMINE 15 MG/ML IJ SOLN
15.0000 mg | Freq: Once | INTRAMUSCULAR | Status: AC
  Administered 2021-11-29: 15 mg via INTRAVENOUS
  Filled 2021-11-29: qty 1

## 2021-11-29 MED ORDER — ONDANSETRON HCL 4 MG/2ML IJ SOLN
4.0000 mg | Freq: Once | INTRAMUSCULAR | Status: AC
  Administered 2021-11-29: 4 mg via INTRAVENOUS
  Filled 2021-11-29: qty 2

## 2021-11-29 MED ORDER — IOHEXOL 350 MG/ML SOLN
75.0000 mL | Freq: Once | INTRAVENOUS | Status: AC | PRN
  Administered 2021-11-29: 75 mL via INTRAVENOUS

## 2021-11-29 MED ORDER — LACTATED RINGERS IV BOLUS
1000.0000 mL | Freq: Once | INTRAVENOUS | Status: AC
Start: 2021-11-29 — End: 2021-11-29
  Administered 2021-11-29: 1000 mL via INTRAVENOUS

## 2021-11-29 MED ORDER — POTASSIUM CHLORIDE 10 MEQ/100ML IV SOLN
10.0000 meq | INTRAVENOUS | Status: AC
  Administered 2021-11-29 (×2): 10 meq via INTRAVENOUS
  Filled 2021-11-29 (×2): qty 100

## 2021-11-29 NOTE — ED Triage Notes (Signed)
Complaining of pain in the rt lower quadrant of her abdomen, making her feel nausous, and it feel like it is moving. It also made her feel like she had to have a bowel movement which she did. Started an hour ago ?

## 2021-11-29 NOTE — ED Provider Notes (Signed)
?[place] ?Provider Note ? ? ?CSN: [id number] ?Arrival date & time: 11/29/21  0636 ? ?  ? ?History ? ?Chief Complaint  ?Patient presents with  ? Abdominal Pain  ? ? ?Sarah Lee is a 71 y.o. female. ? ? ?Abdominal Pain ?Associated symptoms: nausea and vomiting   ? ? 71 year old female with medical history significant for gallstones status post cholecystitis, GERD, lung cancer status post lobectomy who presents to the emergency department with sudden onset right lower quadrant abdominal pain.  The patient states that her last bowel movement was this morning and was normal.  She denies any melena or hematochezia.  She states that she had a sudden onset of right lower quadrant abdominal pain early this morning described as sharp and achy with no radiation.  No flank pain.  She denies any history of kidney stones.  She states that she is passing gas.  She has had several episodes of NBNB emesis this morning associated with her pain and discomfort.  She continues to endorse nausea.  She has a history of intra-abdominal surgeries with a cholecystectomy in addition to multiple cesarean sections.  She denies any fevers or chills.  She denies any diarrhea. ? ?Home Medications ?Prior to Admission medications   ?Medication Sig Start Date End Date Taking? Authorizing Provider  ?Acetylcysteine (N-ACETYL-L-CYSTEINE PO) Take 1 tablet by mouth daily.    Yes [provider]  ?b complex vitamins capsule Take 1 capsule by mouth daily.   Yes [provider]  ?Cholecalciferol (VITAMIN D-3) 125 MCG (5000 UT) TABS Take 10,000 Units by mouth daily.   Yes [provider]  ?Omega-3 Fatty Acids (OMEGA-3 FISH OIL PO) Take 1,000 mg by mouth daily.   Yes [provider]  ?trolamine salicylate (ASPERCREME) 10 % cream Apply 1 application. topically daily as needed for muscle pain.   Yes [provider]  ?diltiazem (CARDIZEM CD) 120 MG 24 hr capsule Take 1  capsule (120 mg total) by mouth daily. 11/20/21 12/20/21  Rex Kras, DO  ?   ? ?Allergies    ?Advil [ibuprofen], Iron, Penicillins, and Tetracyclines & related   ? ?Review of Systems   ?Review of Systems  ?Gastrointestinal:  Positive for abdominal pain, nausea and vomiting.  ?All other systems reviewed and are negative. ? ?Physical Exam ?Updated Vital Signs ?BP 124/68 (BP Location: Right Arm)   Pulse 66   Temp 97.7 ?F (36.5 ?C) (Oral)   Resp 18   Ht 5\' 4"  (1.626 m)   Wt 99.8 kg   LMP 01/17/1994   SpO2 100%   BMI 37.76 kg/m?  ?Physical Exam ?Vitals and nursing note reviewed.  ?Constitutional:   ?   General: She is in acute distress.  ?   Appearance: She is well-developed.  ?   Comments: Uncomfortable appearing, actively retching  ?HENT:  ?   Head: Normocephalic and atraumatic.  ?Eyes:  ?   Conjunctiva/sclera: Conjunctivae normal.  ?Cardiovascular:  ?   Rate and Rhythm: Normal rate and regular rhythm.  ?   Heart sounds: No murmur heard. ?Pulmonary:  ?   Effort: Pulmonary effort is normal. No respiratory distress.  ?   Breath sounds: Normal breath sounds.  ?Abdominal:  ?   Palpations: Abdomen is soft.  ?   Tenderness: There is abdominal tenderness in the right lower quadrant. There is guarding. There is no right CVA tenderness, left CVA tenderness or rebound.  ?Musculoskeletal:     ?  General: No swelling.  ?   Cervical back: Neck supple.  ?Skin: ?   General: Skin is warm and dry.  ?   Capillary Refill: Capillary refill takes less than 2 seconds.  ?Neurological:  ?   Mental Status: She is alert.  ?Psychiatric:     ?   Mood and Affect: Mood normal.  ? ? ?ED Results / Procedures / Treatments   ?Labs ?(all labs ordered are listed, but only abnormal results are displayed) ?Labs Reviewed  ?COMPREHENSIVE METABOLIC PANEL - Abnormal; Notable for the following components:  ?    Result Value  ? Potassium 3.3 (*)   ? Glucose, Bld 120 (*)   ? All other components within normal limits  ?URINALYSIS, ROUTINE W REFLEX  MICROSCOPIC - Abnormal; Notable for the following components:  ? APPearance HAZY (*)   ? Hgb urine dipstick SMALL (*)   ? Leukocytes,Ua TRACE (*)   ? Bacteria, UA FEW (*)   ? All other components within normal limits  ?LIPASE, BLOOD  ?CBC  ?MAGNESIUM  ? ? ?EKG ?None ? ?Radiology ?CT ABDOMEN PELVIS W CONTRAST ? ?Result Date: 11/29/2021 ?CLINICAL DATA:  Right lower quadrant abdominal pain. EXAM: CT ABDOMEN AND PELVIS WITH CONTRAST TECHNIQUE: Multidetector CT imaging of the abdomen and pelvis was performed using the standard protocol following bolus administration of intravenous contrast. RADIATION DOSE REDUCTION: This exam was performed according to the departmental dose-optimization program which includes automated exposure control, adjustment of the mA and/or kV according to patient size and/or use of iterative reconstruction technique. CONTRAST:  72mL OMNIPAQUE IOHEXOL 350 MG/ML SOLN COMPARISON:  07/04/2019 FINDINGS: Lower chest: No acute abnormality Hepatobiliary: No focal liver abnormality is seen. Status post cholecystectomy. No biliary dilatation. Pancreas: Unremarkable. No pancreatic ductal dilatation or surrounding inflammatory changes. Spleen: Normal in size without focal abnormality. Adrenals/Urinary Tract: Normal adrenal glands. There is right pelvocaliectasis and hydroureter identified. There is a stone identified within the dependent portion of the bladder slightly right of the midline near the right UVJ measuring 2 mm, image 78/3. Left kidney is unremarkable. Stomach/Bowel: Stomach appears normal. The appendix is visualized and appears within normal limits. No bowel wall thickening, inflammation, or distension. Vascular/Lymphatic: No significant vascular findings are present. No enlarged abdominal or pelvic lymph nodes. Reproductive: Status post hysterectomy. No adnexal masses. Other: No free fluid or fluid collections Musculoskeletal: Degenerative disc disease noted at L5-S1. IMPRESSION: Right-sided  pelvocaliectasis and hydroureter with 2 mm stone identified within the dependent portion of the bladder near the right UVJ. Electronically Signed   By: Kerby Moors M.D.   On: 11/29/2021 08:52   ? ?Procedures ?Procedures  ? ? ?Medications Ordered in ED ?Medications  ?HYDROmorphone (DILAUDID) injection 1 mg (1 mg Intravenous Given 11/29/21 0725)  ?ondansetron [facility]) injection 4 mg (4 mg Intravenous Given 11/29/21 0725)  ?lactated ringers bolus 1,000 mL (0 mLs Intravenous Stopped 11/29/21 0844)  ?iohexol (OMNIPAQUE) 350 MG/ML injection 75 mL (75 mLs Intravenous Contrast Given 11/29/21 0816)  ?potassium chloride 10 mEq in 100 mL IVPB (10 mEq Intravenous New Bag/Given 11/29/21 1010)  ?ketorolac (TORADOL) 15 MG/ML injection 15 mg (15 mg Intravenous Given 11/29/21 1007)  ? ? ?ED Course/ Medical Decision Making/ A&P ?  ?                        ?Medical Decision Making ?Amount and/or Complexity of Data Reviewed ?Labs: ordered. ?Radiology: ordered. ? ?Risk ?Prescription drug management. ? ? ?71 year old female with  medical history significant for gallstones status post cholecystitis, GERD, lung cancer status post lobectomy who presents to the emergency department with sudden onset right lower quadrant abdominal pain.  The patient states that her last bowel movement was this morning and was normal.  She denies any melena or hematochezia.  She states that she had a sudden onset of right lower quadrant abdominal pain early this morning described as sharp and achy with no radiation.  No flank pain.  She denies any history of kidney stones.  She states that she is passing gas.  She has had several episodes of NBNB emesis this morning associated with her pain and discomfort.  She continues to endorse nausea.  She has a history of intra-abdominal surgeries with a cholecystectomy in addition to multiple cesarean sections.  She denies any fevers or chills.  She denies any diarrhea. ? ?Vitals and telemetry on arrival: Afebrile,  hemodynamically stable, sinus rhythm noted on cardiac telemetry. ? ?Pertinent exam findings include: Right lower quadrant tenderness to palpation, no rebound or guarding, no CVA tenderness. ? ?Differential Diagnosis: Nephrolithiasis

## 2021-11-29 NOTE — Discharge Instructions (Signed)
You were evaluated in the Emergency Department and after careful evaluation, we did not find any emergent condition requiring admission or further testing in the hospital. ? ?Your exam/testing today was positive for a right sided kidney stone which as of the time of imaging has passed into your bladder and will pass in your urine. Recommend NSAIDs for pain control as needed and outpatient Urology follow-up. ? ?Please return to the Emergency Department if you experience any worsening of your condition.  Thank you for allowing [place] to be a part of your care. ? ?

## 2021-12-15 ENCOUNTER — Encounter: Payer: Self-pay | Admitting: Internal Medicine

## 2021-12-17 IMAGING — MR MR HEAD WO/W CM
13 series · 48 of 48 positions shown · IV contrast (Multihance)
Comparison: 02/22/2014

CLINICAL DATA: Non-small cell lung cancer, rule out brain Mets.

EXAM:
MRI HEAD WITHOUT AND WITH CONTRAST
TECHNIQUE: Multiplanar, multiecho pulse sequences of the brain and surrounding
structures were obtained without and with intravenous contrast.
CONTRAST:  20mL MULTIHANCE GADOBENATE DIMEGLUMINE 529 MG/ML IV SOLN

[Series 2: t1_se_sag · sagittal · 5.0mm · 0.45mm/px · 2 of 21 slices shown]
[im 1/21]
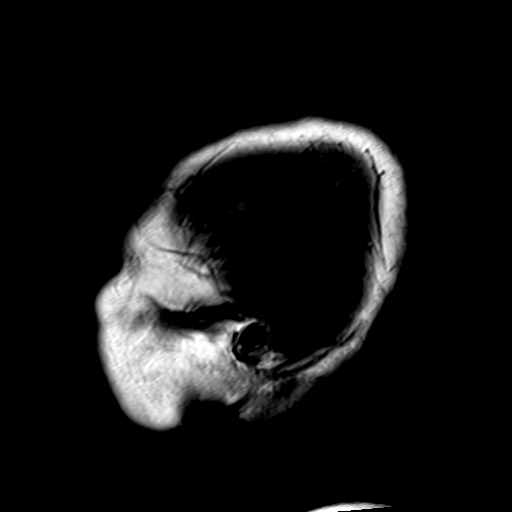
[im 21/21]
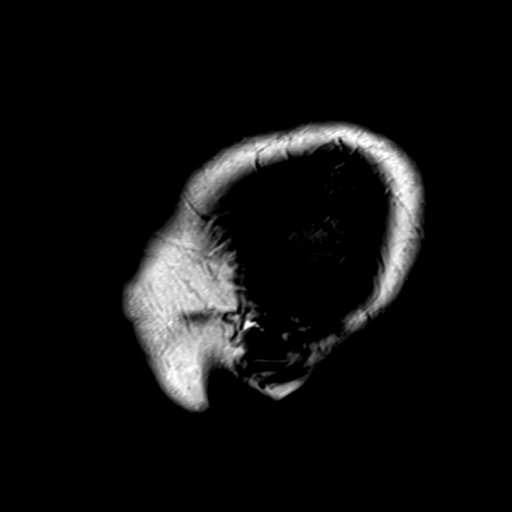

[Series 3: ep2d_diff_3 · axial · 3.0mm · 1.80mm/px · z∈[-50,+93]mm · 7 of 98 slices shown]
[im 1/98]
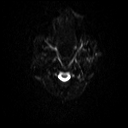
[im 17/98]
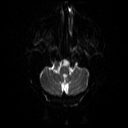
[im 33/98]
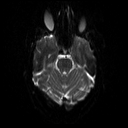
[im 49/98]
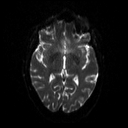
[im 65/98]
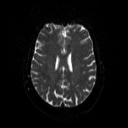
[im 81/98]
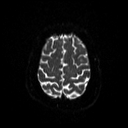
[im 98/98]
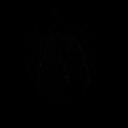

[Series 4: ep2d_diff_3_adc · axial · 3.0mm · 1.80mm/px · z∈[-50,+93]mm · 3 of 49 slices shown]
[im 1/49]
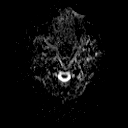
[im 25/49]
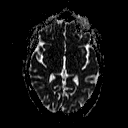
[im 49/49]
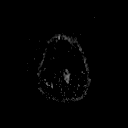

[Series 5: ep2d_diff_cor · coronal · 5.0mm · 1.77mm/px · 3 of 45 slices shown]
[im 1/45]
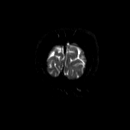
[im 23/45]
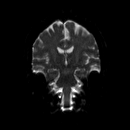
[im 45/45]
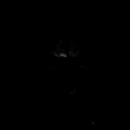

[Series 6: ep2d_diff_cor_adc · coronal · 5.0mm · 1.77mm/px · 2 of 24 slices shown]
[im 1/24]
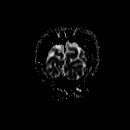
[im 24/24]
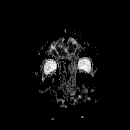

[Series 8: swi_images · axial · 4.0mm · 0.90mm/px · z∈[-47,+91]mm · 2 of 36 slices shown]
[im 1/36]
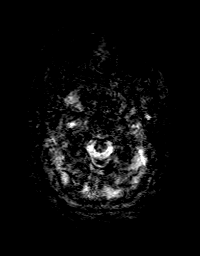
[im 36/36]
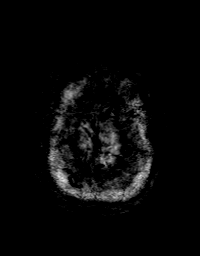

[Series 9: FLAIR · axial · 3.0mm · 0.43mm/px · z∈[-56,+98]mm · 2 of 27 slices shown]
[im 1/27]
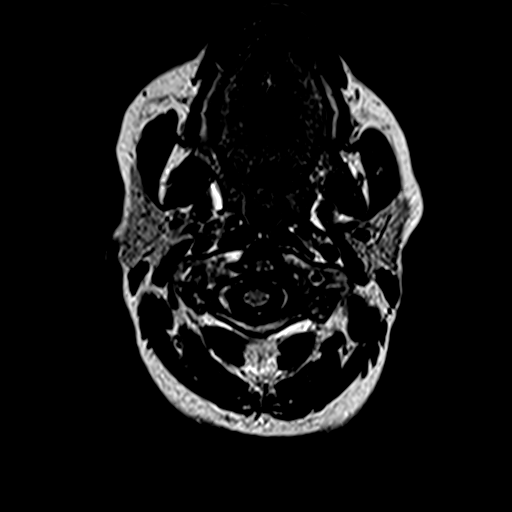
[im 27/27]
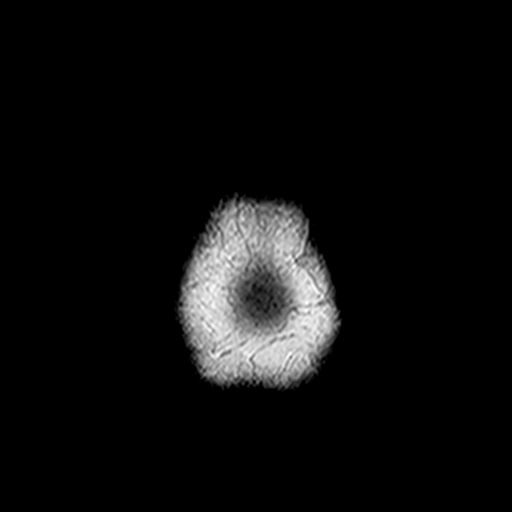

[Series 10: t2_tse_tra · axial · 5.0mm · 0.72mm/px · z∈[-52,+96]mm · 2 of 26 slices shown]
[im 1/26]
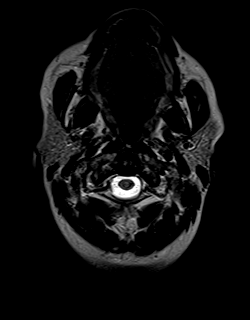
[im 26/26]
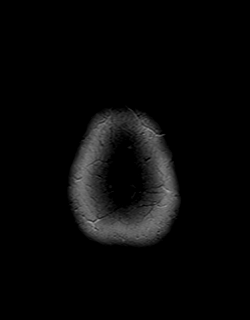

[Series 11: t1_mpr_tra · axial · 1.0mm · 0.72mm/px · z∈[-49,+93]mm · 10 of 144 slices shown]
[im 1/144]
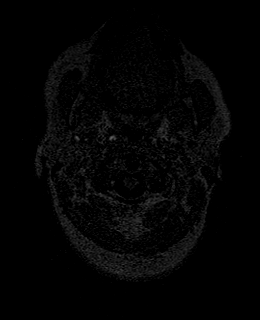
[im 16/144]
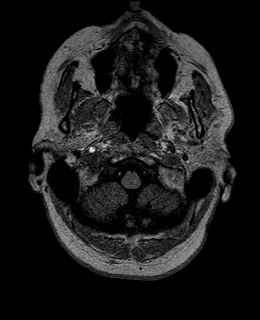
[im 32/144]
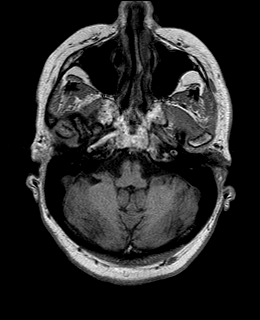
[im 48/144]
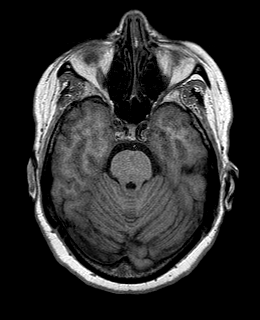
[im 64/144]
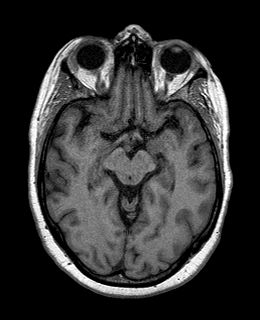
[im 80/144]
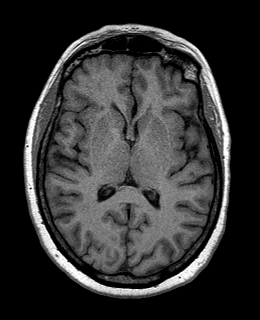
[im 96/144]
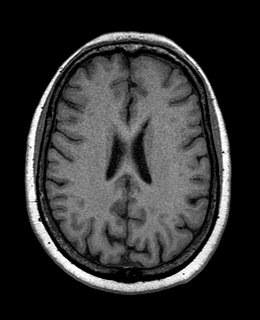
[im 112/144]
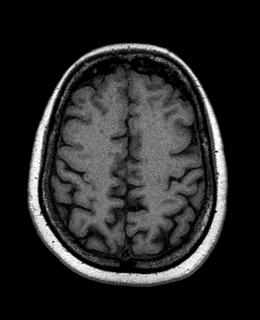
[im 128/144]
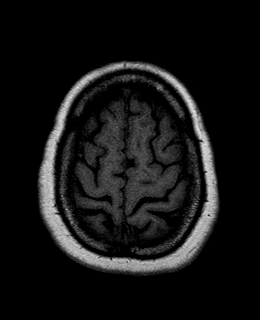
[im 144/144]
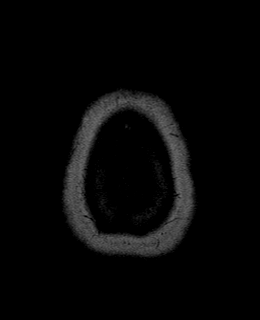

[Series 12: T2 post-contrast · coronal · 5.0mm · 0.45mm/px · 2 of 30 slices shown]
[im 1/30]
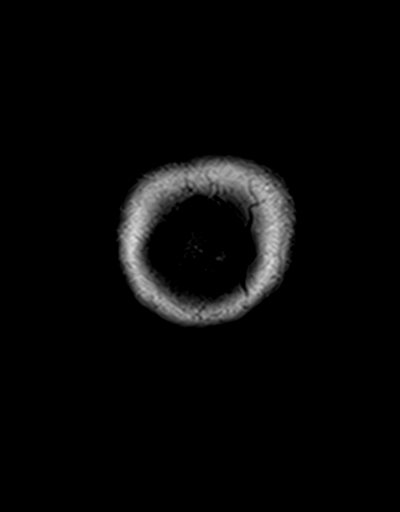
[im 30/30]
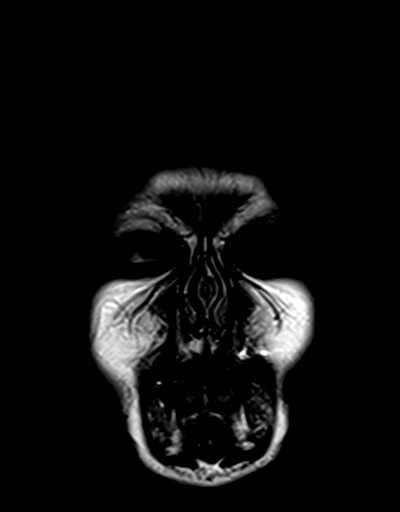

[Series 13: post t1_mpr_tra · axial · 1.0mm · 0.72mm/px · z∈[-49,+93]mm · 10 of 144 slices shown]
[im 1/144]
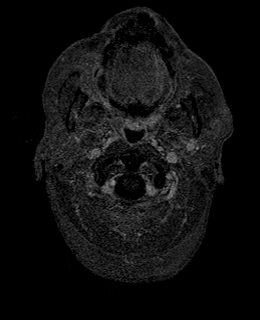
[im 16/144]
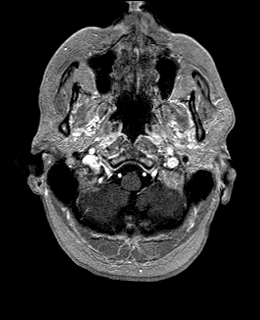
[im 32/144]
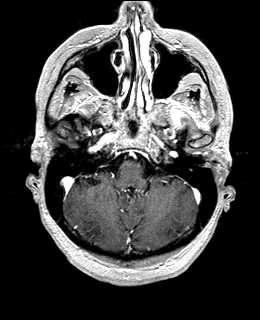
[im 48/144]
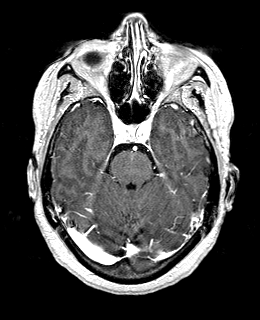
[im 64/144]
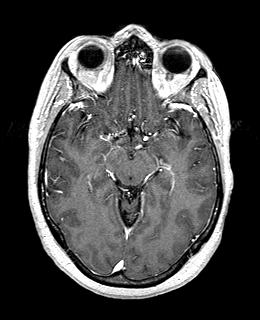
[im 80/144]
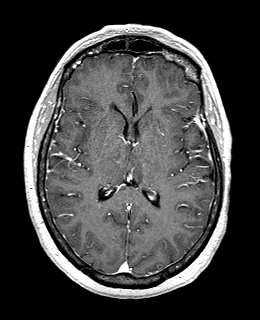
[im 96/144]
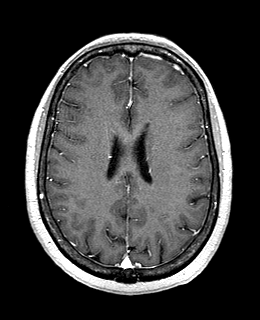
[im 112/144]
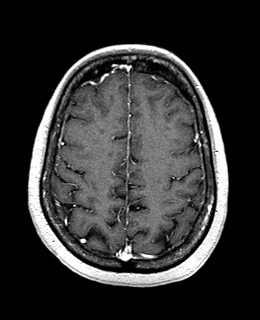
[im 128/144]
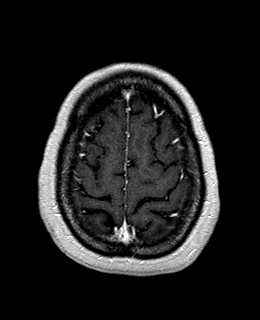
[im 144/144]
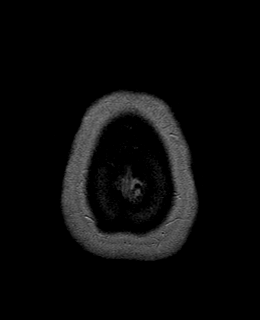

[Series 14: T1 post-contrast · coronal · 5.0mm · 0.72mm/px · 2 of 30 slices shown]
[im 1/30]
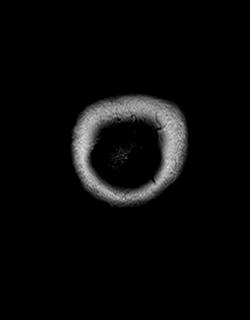
[im 30/30]
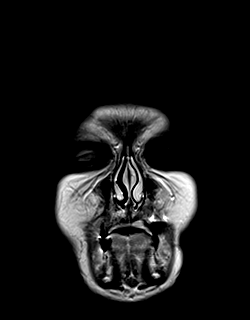

[Series 15: t1_se_sag post · sagittal · 5.0mm · 0.45mm/px · 1 of 21 slices shown]
[im 1/21]
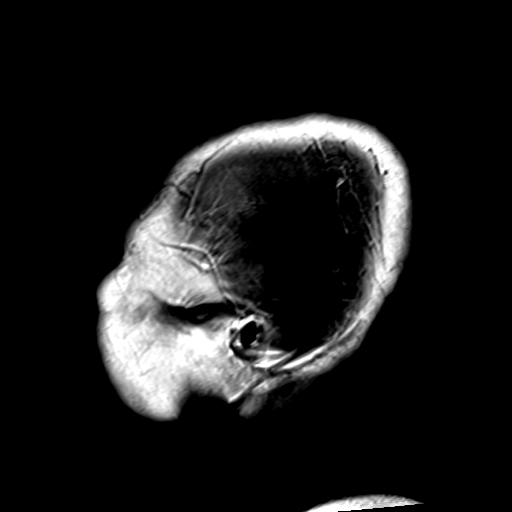

[48 of 48 positions shown; findings below may reference images not displayed]

FINDINGS: Brain: No swelling or enhancement to suggest metastatic disease. Few
FLAIR hyperintensities in the cerebral white matter attributed to
mild chronic small vessel ischemia. Brain volume is normal. No gray
matter infarct, hemorrhage, hydrocephalus, or collection.

Vascular: Normal flow voids and vascular enhancements

Skull and upper cervical spine: Normal marrow signal

Sinuses/Orbits: Negative
IMPRESSION: Negative for metastatic disease.

## 2021-12-29 IMAGING — DX DG CHEST 1V PORT
1 series · 1 of 1 positions shown · non-contrast
Comparison: 10/01/2019, [place] 01/31/2019, CT 07/04/2019

CLINICAL DATA: Postop

EXAM:
PORTABLE CHEST 1 VIEW

[chest]
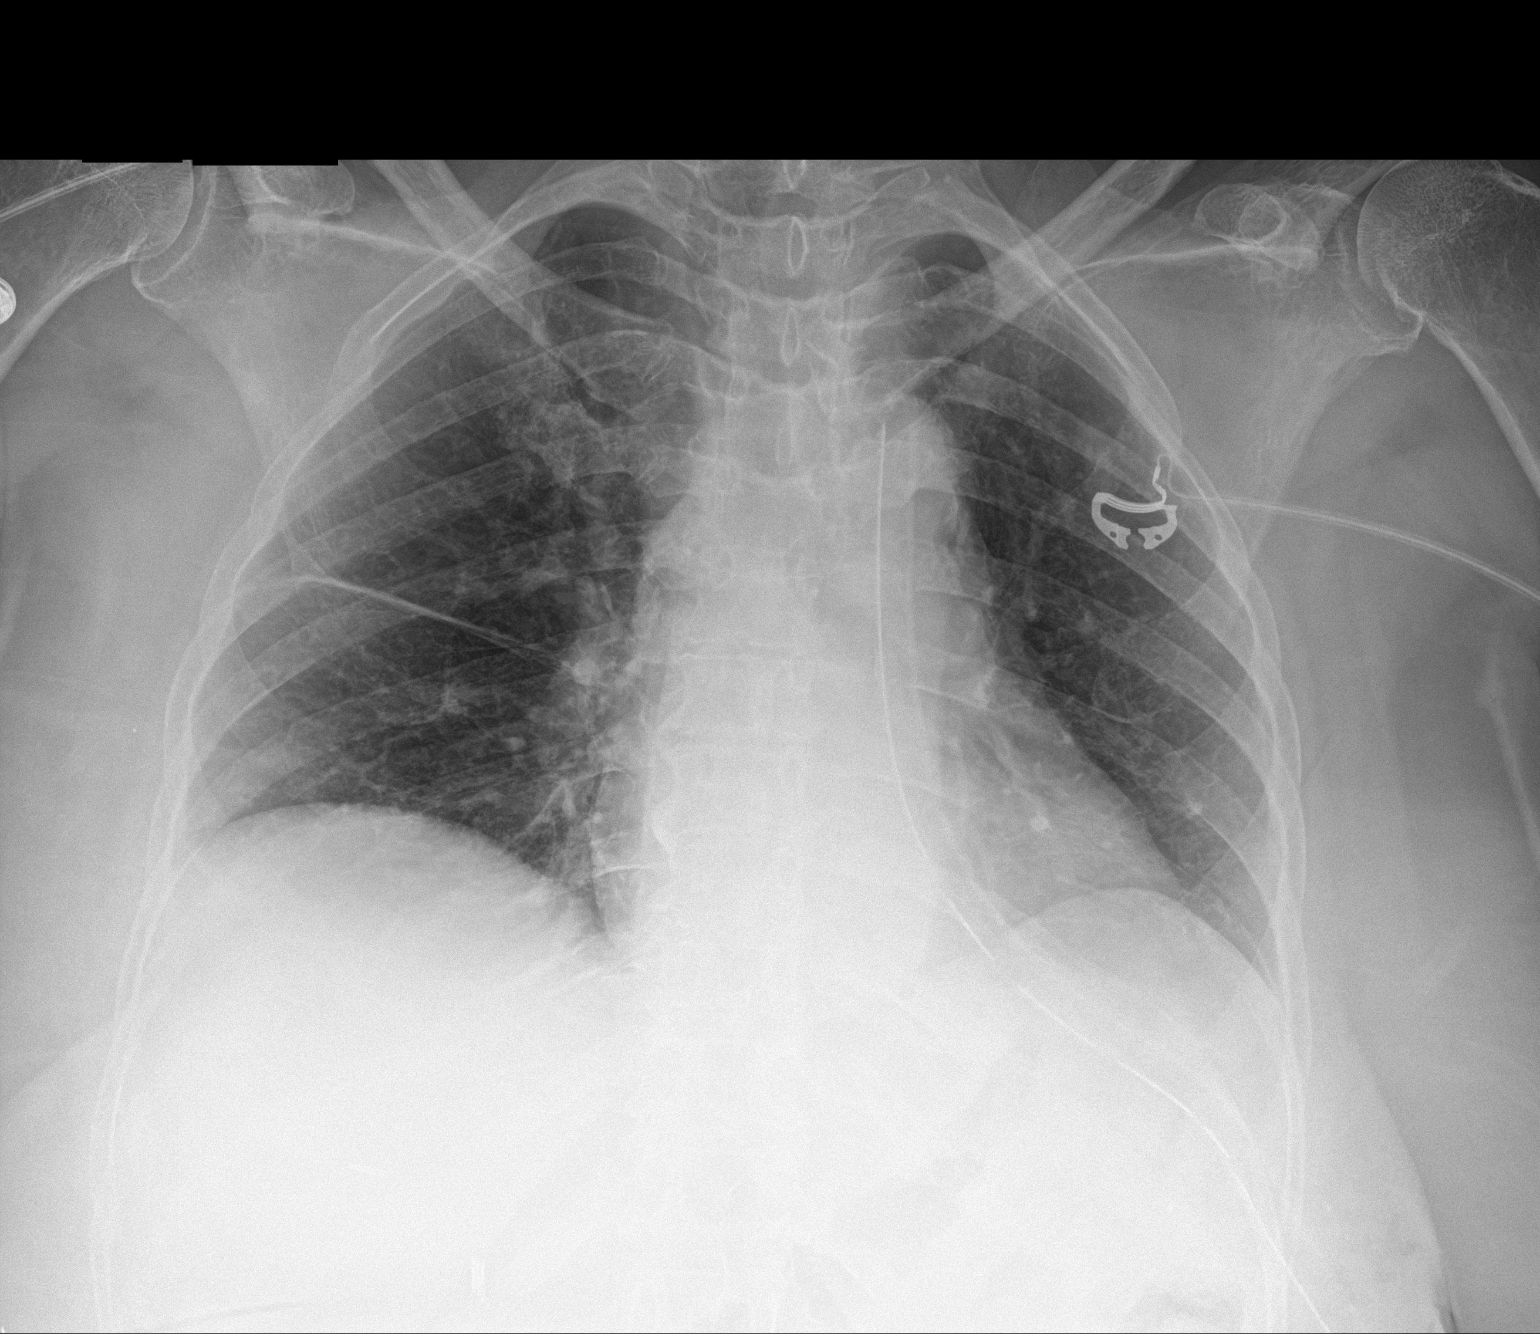

[1 of 1 positions shown; findings below may reference images not displayed]

FINDINGS: Left-sided chest tube in place with tip projecting over the medial
upper chest. Previously noted left lower lobe lung mass no longer
visualized. Thickening along the right fissure versus small amount
of fissural fluid. No visible pneumothorax. Heart size within normal
limits allowing for low lung volume
IMPRESSION: Placement of left-sided chest tube as above. Previously noted left
lower lobe lung mass not identified. No definitive pneumothorax.
Minor fluid or thickening along the right fissure.

## 2021-12-30 IMAGING — DX DG CHEST 1V PORT
1 series · 1 of 1 positions shown · non-contrast
Comparison: Radiograph 09/30/2017

CLINICAL DATA: Postop. Left chest tube. Post left VATS.

EXAM:
PORTABLE CHEST 1 VIEW

[chest]
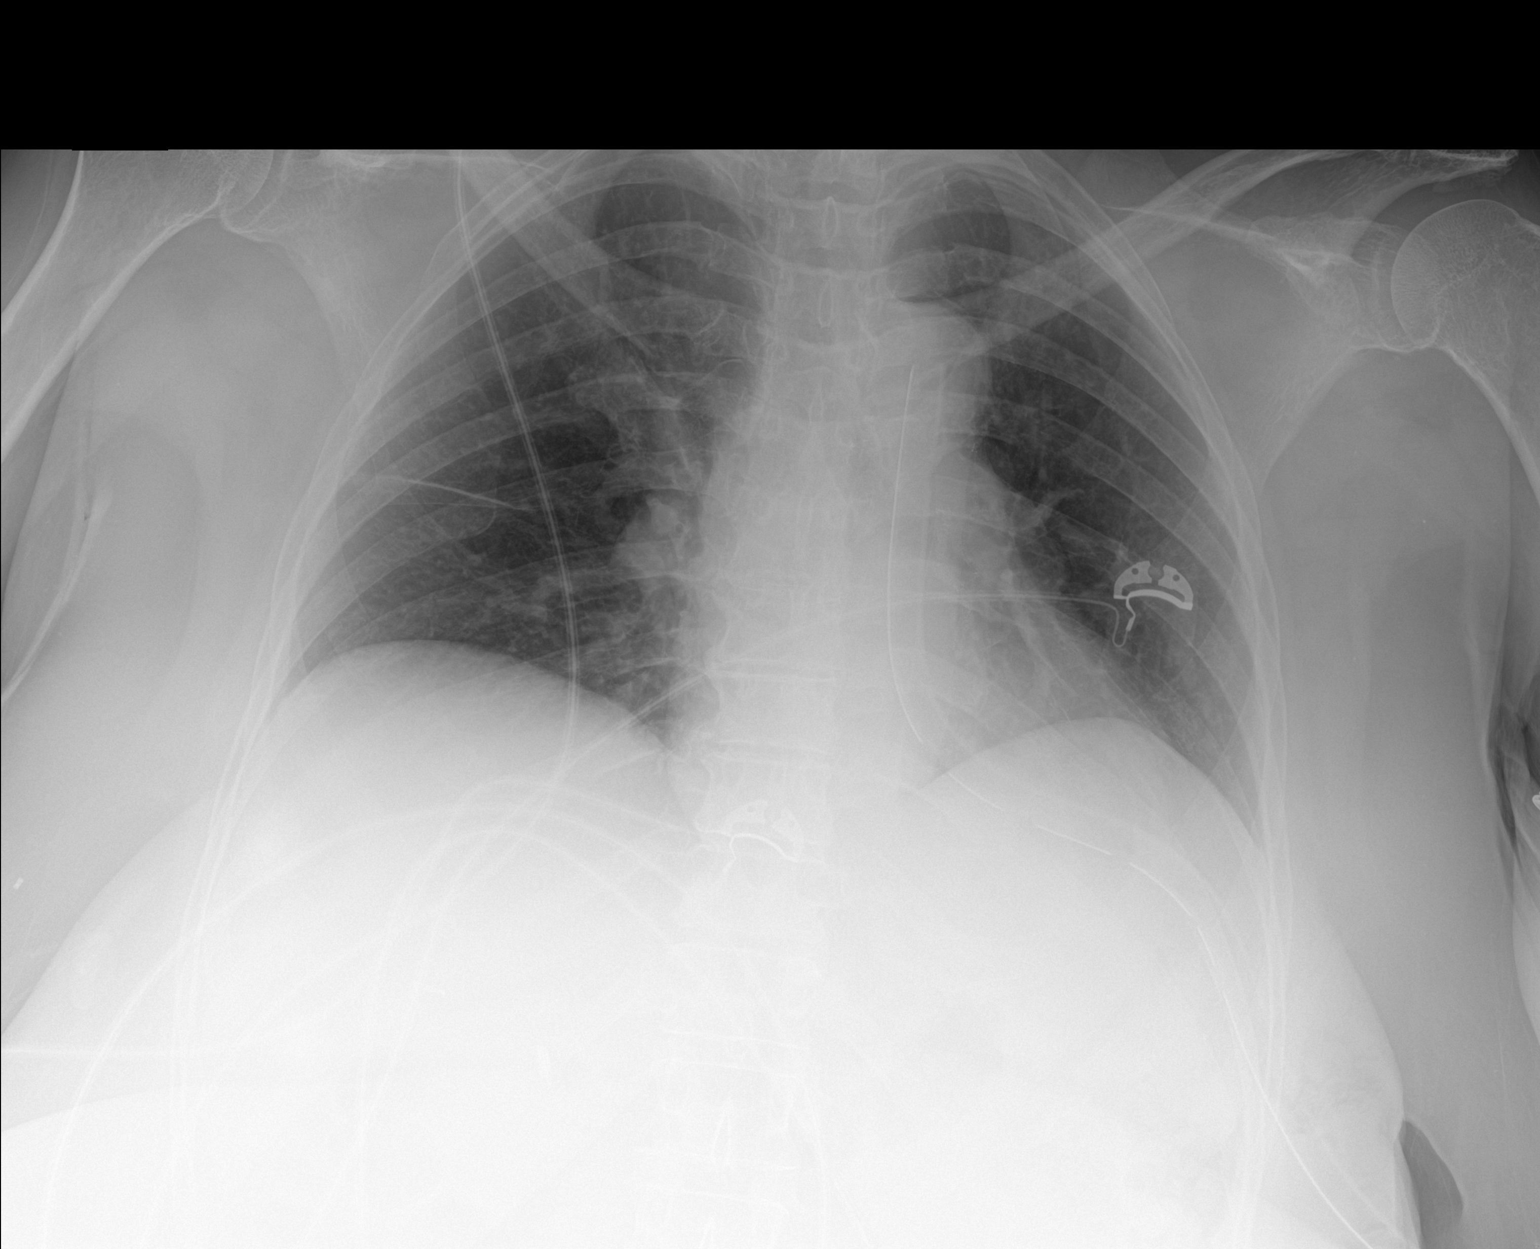

[1 of 1 positions shown; findings below may reference images not displayed]

FINDINGS: Left chest tube remains in place. No visualized pneumothorax.
Slightly lower lung volumes from prior exam. Minimal streaky left
lung base atelectasis. Linear atelectasis in the right mid lung
versus fissural thickening/fluid. No new airspace disease. No
pleural fluid. Unchanged heart size and mediastinal contours.
IMPRESSION: 1. Left chest tube remains in place. No visualized pneumothorax.
2. Low lung volumes with streaky left lung base atelectasis.

## 2022-04-05 ENCOUNTER — Other Ambulatory Visit: Payer: Self-pay

## 2022-05-20 ENCOUNTER — Encounter: Payer: Self-pay | Admitting: Internal Medicine

## 2022-05-25 ENCOUNTER — Other Ambulatory Visit: Payer: Self-pay

## 2022-05-27 ENCOUNTER — Ambulatory Visit: Payer: Medicare Other | Admitting: Cardiology

## 2022-06-15 ENCOUNTER — Encounter: Payer: Self-pay | Admitting: Cardiology

## 2022-06-15 ENCOUNTER — Ambulatory Visit: Payer: Medicare Other | Admitting: Cardiology

## 2022-06-15 ENCOUNTER — Encounter: Payer: Self-pay | Admitting: Internal Medicine

## 2022-06-15 VITALS — BP 112/74 | HR 80 | Temp 97.2°F | Resp 16 | Ht 64.0 in | Wt 226.0 lb

## 2022-06-15 DIAGNOSIS — I471 Supraventricular tachycardia, unspecified: Secondary | ICD-10-CM

## 2022-06-15 DIAGNOSIS — R002 Palpitations: Secondary | ICD-10-CM

## 2022-06-15 NOTE — Progress Notes (Signed)
Date:  06/15/2022   ID:  [name], DOB May 09, 1951, MRN [id number]  PCP:  Willey Blade, MD  Cardiologist:   Rex Kras, DO, [facility]  (established care 10/16/2021)  Date: 06/15/22 Last Office Visit: 11/26/2021  Chief Complaint  Patient presents with   Follow-up    6 month follow up for palpitations.    HPI  Sarah ABASCAL is a 71 y.o. African-American female whose past medical history and cardiovascular risk factors include: Former smoker, vitamin D deficiency, history of lung cancer (in remission).   Patient was referred to the practice for evaluation and management of palpitations.  She has undergone appropriate work-up in the past including a Zio patch which noted no evidence of atrial fibrillation or any significant dysrhythmias.  She had short intermittent episodes of PSVT but her overall total supraventricular ectopic burden was less than 1%.  She was recommended to be on diltiazem 120 mg p.o. daily for symptom management.  She now presents for 68-monthfollow-up visit.  Patient states that the intensity/frequency/duration of palpitations remain relatively stable.  Patient informs me that she is not taking diltiazem as initially prescribed as she is concerned that it may affect her thyroid function.  She denies anginal discomfort or heart failure symptoms.  FUNCTIONAL STATUS: No structured exercise program or daily routine.   ALLERGIES: Allergies  Allergen Reactions   Advil [Ibuprofen] Other (See Comments)    Rectal bleeding    Iron Other (See Comments)    Extreme constipation   Penicillins Rash    Did it involve swelling of the face/tongue/throat, SOB, or low BP? No Did it involve sudden or severe rash/hives, skin peeling, or any reaction on the inside of your mouth or nose? No Did you need to seek medical attention at a hospital or doctor's office? No When did it last happen?~20 years or so If all above answers are "NO", may proceed with cephalosporin use.     Tetracyclines & Related Nausea And Vomiting and Anxiety    MEDICATION LIST PRIOR TO VISIT: Current Meds  Medication Sig   Acetylcysteine (N-ACETYL-L-CYSTEINE PO) Take 1 tablet by mouth daily.    b complex vitamins capsule Take 1 capsule by mouth daily.   Cholecalciferol (VITAMIN D-3) 125 MCG (5000 UT) TABS Take 10,000 Units by mouth daily.   Omega-3 Fatty Acids (OMEGA-3 FISH OIL PO) Take 1,000 mg by mouth daily.     PAST MEDICAL HISTORY: Past Medical History:  Diagnosis Date   Allergy    Anemia    Arthritis    Asthma    as a child   Blood transfusion    [place]    lung cancer   Cataract    slight   Complication of anesthesia    used sodium pentothal and very hard to wake up and arm swelled up from medication   Constipation    Gallstones    GERD (gastroesophageal reflux disease)    Heart murmur    no issues   Pneumonia    Seasonal allergies     PAST SURGICAL HISTORY: Past Surgical History:  Procedure Laterality Date   ABDOMINAL HYSTERECTOMY  2004   fibroids--- TAH/BSO   BREAST SURGERY     right breast biopsy-benign   CESAREAN SECTION     CHOLECYSTECTOMY N/A 10/28/2016   Procedure: LAPAROSCOPIC CHOLECYSTECTOMY WITH INTRAOPERATIVE CHOLANGIOGRAM;  Surgeon: TJackolyn Confer MD;  Location: [facility];  Service: General;  Laterality: N/A;   COLONOSCOPY     ERCP N/A 10/29/2016  Procedure: ENDOSCOPIC RETROGRADE CHOLANGIOPANCREATOGRAPHY (ERCP);  Surgeon: Doran Stabler, MD;  Location: [facility];  Service: Endoscopy;  Laterality: N/A;   LUNG LOBECTOMY     MULTIPLE EXTRACTIONS WITH ALVEOLOPLASTY Bilateral 12/06/2019   Procedure: Extraction of tooth #'s 4, and 7-13 with alveoloplasty  and bilateral maxillary  tuberosity reductions;  Surgeon: Lenn Cal, DDS;  Location: [facility];  Service: Oral Surgery;  Laterality: Bilateral;   NODE DISSECTION Left 10/01/2019   Procedure: Node Dissection;  Surgeon: [name], MD;  Location: [facility];  Service: Thoracic;   Laterality: Left;   POLYPECTOMY     UTERINE FIBROID SURGERY     VIDEO BRONCHOSCOPY N/A 10/01/2019   Procedure: VIDEO BRONCHOSCOPY;  Surgeon: [name], MD;  Location: [facility];  Service: Thoracic;  Laterality: N/A;    FAMILY HISTORY: The patient family history includes Anemia in her mother; Cancer in her father; Stomach cancer in her cousin and maternal uncle.  SOCIAL HISTORY:  The patient  reports that she quit smoking about 34 years ago. Her smoking use included cigarettes. She has a 37.50 pack-year smoking history. She has never used smokeless tobacco. She reports current alcohol use. She reports that she does not use drugs.  REVIEW OF SYSTEMS: Review of Systems  Cardiovascular:  Positive for palpitations. Negative for chest pain, claudication, dyspnea on exertion, irregular heartbeat, leg swelling, near-syncope, orthopnea, paroxysmal nocturnal dyspnea and syncope.  Respiratory:  Negative for shortness of breath.   Hematologic/Lymphatic: Negative for bleeding problem.  Musculoskeletal:  Negative for muscle cramps and myalgias.  Neurological:  Negative for dizziness and light-headedness.   PHYSICAL EXAM:    06/15/2022    3:58 PM 11/29/2021   10:45 AM 11/29/2021   10:10 AM  Vitals with BMI  Height 5' 4"     Weight 226 lbs    BMI 29.92    Systolic 426 834 196  Diastolic 74 58 68  Pulse 80 60 66    CONSTITUTIONAL: Well-developed and well-nourished. No acute distress.  SKIN: Skin is warm and dry. No rash noted. No cyanosis. No pallor. No jaundice HEAD: Normocephalic and atraumatic.  EYES: No scleral icterus MOUTH/THROAT: Moist oral membranes.  NECK: No JVD present. No thyromegaly noted. No carotid bruits  CHEST Normal respiratory effort. No intercostal retractions  LUNGS: Clear to auscultation bilaterally.  No stridor. No wheezes. No rales.  CARDIOVASCULAR: Regular rate and rhythm, positive S1-S2, no murmurs rubs or gallops appreciated. ABDOMINAL: Obese, soft,  nontender, nondistended, positive bowel sounds all 4 quadrants. No apparent ascites.  EXTREMITIES: No peripheral edema, warm to touch, 2+ bilateral DP and +1 bilateral PT pulses HEMATOLOGIC: No significant bruising NEUROLOGIC: Oriented to person, place, and time. Nonfocal. Normal muscle tone.  PSYCHIATRIC: Normal mood and affect. Normal behavior. Cooperative  CARDIAC DATABASE: EKG: 06/15/2022: Normal sinus rhythm, 78 bpm, LVH per voltage criteria, ST-T changes likely secondary to repolarization abnormality.  Echocardiogram: 11/26/2021: Normal LV systolic function with visual EF 60-65%. Left ventricle cavity is normal in size. Normal left ventricular wall thickness. Normal global wall motion. Indeterminate diastolic filling pattern, elevated LAP.  Trace aortic regurgitation. Mild tricuspid regurgitation. No evidence of pulmonary hypertension. No prior study for comparison.    Stress Testing: 10/14/2019: Nuclear stress EF: 72%. The left ventricular ejection fraction is hyperdynamic (>65%). There was no ST segment deviation noted during stress. Defect 1: There is a small defect of moderate severity present in the apical inferior, apical lateral and apex location. This is most c/w chest and diaphragmatic  attenuation artifact. This is a low risk study. There is no evidence of ischemia or previous infarction. There is hyperdynamic left ventricular systolic function. The study is normal.  Heart Catheterization: None  14 day extended Holter monitor: October 16, 2021- October 30, 2021 Dominant rhythm normal sinus rhythm. Heart rate 49-169 bpm.  Avg HR 77 bpm. No atrial fibrillation, ventricular tachycardia, high grade AV block, pauses (3 seconds or longer).  Total ventricular ectopic burden less than 1%. Asymptomatic, rare episodes of PSVT, total supraventricular ectopic burden <1%. Patient triggered events: 2.  These did not correlate with any significant dysrhythmias.  LABORATORY  DATA:    Latest Ref Rng & Units 11/29/2021    7:03 AM 03/12/2020   12:56 AM 01/15/2020    8:54 AM  CBC  WBC 4.0 - 10.5 K/uL 7.6  2.8  5.9   Hemoglobin 12.0 - 15.0 g/dL 12.8  8.6  10.9   Hematocrit 36.0 - 46.0 % 38.4  26.2  34.1   Platelets 150 - 400 K/uL 229  62  231        Latest Ref Rng & Units 11/29/2021    7:03 AM 03/12/2020   12:56 AM 01/15/2020    8:54 AM  CMP  Glucose 70 - 99 mg/dL 120  77  93   BUN 8 - 23 mg/dL 18  22  15    Creatinine 0.44 - 1.00 mg/dL 0.96  0.80  1.04   Sodium 135 - 145 mmol/L 136  141  141   Potassium 3.5 - 5.1 mmol/L 3.3  4.0  3.8   Chloride 98 - 111 mmol/L 102  108  107   CO2 22 - 32 mmol/L 25  26  27    Calcium 8.9 - 10.3 mg/dL 9.2  9.5  9.3   Total Protein 6.5 - 8.1 g/dL 6.5  6.8  6.7   Total Bilirubin 0.3 - 1.2 mg/dL 0.7  0.5  0.4   Alkaline Phos 38 - 126 U/L 78  62  77   AST 15 - 41 U/L 27  27  24    ALT 0 - 44 U/L 22  36  23     Lipid Panel     Component Value Date/Time   CHOL 200 02/14/2014 1117   TRIG 140.0 02/14/2014 1117   HDL 55.00 02/14/2014 1117   CHOLHDL 4 02/14/2014 1117   VLDL 28.0 02/14/2014 1117   LDLCALC 117 (H) 02/14/2014 1117    No components found for: "NTPROBNP" No results for input(s): "PROBNP" in the last 8760 hours. No results for input(s): "TSH" in the last 8760 hours.  BMP Recent Labs    11/29/21 0703  NA 136  K 3.3*  CL 102  CO2 25  GLUCOSE 120*  BUN 18  CREATININE 0.96  CALCIUM 9.2  GFRNONAA >60    HEMOGLOBIN A1C No results found for: "HGBA1C", "MPG"  External Labs:  Date Collected: 08/03/2021 , information obtained by referring physician Potassium: 3.9 Creatinine 0.88 mg/dL. eGFR: 70 mL/min per 1.73 m Hemoglobin: 12.4 g/dL and hematocrit: 37.9 % Lipid profile: Total cholesterol 194 , triglycerides 68 , HDL 71 , LDL 111 AST: 22 , ALT: 18 , alkaline phosphatase: 97  TSH: 0.817 (within normal limits)  IMPRESSION:    ICD-10-CM   1. Palpitations  R00.2 EKG 12-Lead    2. PSVT (paroxysmal  supraventricular tachycardia)  I47.10        RECOMMENDATIONS: Sarah Lee is a 71 y.o. African-American female whose past medical  history and cardiac risk factors include: Former smoker, vitamin D deficiency, history of lung cancer (in remission).   Over the last 6 months patient states that her palpitations have remained stable with regards to intensity, frequency, and/or duration.  She was prescribed diltiazem 120 mg p.o. daily to use for symptom relief; however, chose not to.  Patient states that she was concerned that it may affect her thyroid function.  Patient was reassured that it should not affect her thyroid function and may consider a trial to see if her symptoms were to improve.  Patient is willing to do so.  She already has a prescription for diltiazem 120 mg p.o. daily.  I will see her back on Lee annual basis or sooner if needed.  FINAL MEDICATION LIST END OF ENCOUNTER: No orders of the defined types were placed in this encounter.   Medications Discontinued During This Encounter  Medication Reason   diltiazem (CARDIZEM CD) 120 MG 24 hr capsule    trolamine salicylate (ASPERCREME) 10 % cream      Current Outpatient Medications:    Acetylcysteine (N-ACETYL-L-CYSTEINE PO), Take 1 tablet by mouth daily. , Disp: , Rfl:    b complex vitamins capsule, Take 1 capsule by mouth daily., Disp: , Rfl:    Cholecalciferol (VITAMIN D-3) 125 MCG (5000 UT) TABS, Take 10,000 Units by mouth daily., Disp: , Rfl:    Omega-3 Fatty Acids (OMEGA-3 FISH OIL PO), Take 1,000 mg by mouth daily., Disp: , Rfl:   Orders Placed This Encounter  Procedures   EKG 12-Lead    There are no Patient Instructions on file for this visit.   --Continue cardiac medications as reconciled in final medication list. --Return in about 1 year (around 06/16/2023) for Follow up, Palpitations. Or sooner if needed. --Continue follow-up with your primary care physician regarding the management of your other chronic  comorbid conditions.  Patient's questions and concerns were addressed to her satisfaction. She voices understanding of the instructions provided during this encounter.   This note was created using a voice recognition software as a result there may be grammatical errors inadvertently enclosed that do not reflect the nature of this encounter. Every attempt is made to correct such errors.  Rex Kras, [place], [facility]  Pager: [phone] Office: [phone]

## 2022-06-16 ENCOUNTER — Other Ambulatory Visit: Payer: Self-pay

## 2022-07-20 ENCOUNTER — Other Ambulatory Visit: Payer: Self-pay | Admitting: Thoracic Surgery (Cardiothoracic Vascular Surgery)

## 2022-07-20 DIAGNOSIS — R911 Solitary pulmonary nodule: Secondary | ICD-10-CM

## 2022-09-02 NOTE — Progress Notes (Signed)
      [address]       [place] [address]             [phone]        Sarah Lee Medical Record #[id number] Date of Birth: 17-Mar-1951  Referring: Tanda Rockers, MD Primary Care: Willey Blade, MD Primary Cardiologist:None  Reason for visit:   follow-up  History of Present Illness:     71 year old female comes in for annual surveillance CT scan.  She has done well.  Physical Exam: LMP 01/17/1994   Alert NAD Abdomen, ND No peripheral edema   Diagnostic Studies & Laboratory data: CT chest:   MPRESSION: 1. Postsurgical change consistent with LEFT lower lobe resection. 2. New soft tissue inferior along the resection margin adjacent LEFT atrium. Potential delayed postsurgical change versus tumor recurrence. Consider FDG FDG PET for further characterization 3. Stable benign appearing pleuroparenchymal thickening inferior LEFT lung. 4. No evidence of lung cancer recurrence.  Assessment / Plan:   Assessment / Plan:   71 year old female with history stage IIa non-small cell lung cancer.  She will be 3 years out from her original surgery since January.  Cross-sectional imaging reveals a negative soft tissue measuring 1.5 cm, on the left side.  I have ordered a PET/CT for further evaluation of this.  Of note all of her previous chemotherapy occurred at [facility].  I will see her back after her scan.  [name] 09/02/2022 5:51 PM

## 2022-09-03 ENCOUNTER — Encounter: Payer: Self-pay | Admitting: Thoracic Surgery (Cardiothoracic Vascular Surgery)

## 2022-09-03 ENCOUNTER — Other Ambulatory Visit: Payer: Self-pay | Admitting: Thoracic Surgery (Cardiothoracic Vascular Surgery)

## 2022-09-03 ENCOUNTER — Ambulatory Visit (INDEPENDENT_AMBULATORY_CARE_PROVIDER_SITE_OTHER): Payer: Medicare Other | Admitting: Thoracic Surgery (Cardiothoracic Vascular Surgery)

## 2022-09-03 ENCOUNTER — Ambulatory Visit
Admission: RE | Admit: 2022-09-03 | Discharge: 2022-09-03 | Disposition: A | Discharge status: Home / Self Care | Payer: Medicare Other | Source: Ambulatory Visit | Attending: Thoracic Surgery (Cardiothoracic Vascular Surgery) | Admitting: Thoracic Surgery (Cardiothoracic Vascular Surgery)

## 2022-09-03 ENCOUNTER — Encounter: Payer: Self-pay | Admitting: Internal Medicine

## 2022-09-03 VITALS — BP 117/74 | HR 65 | Resp 20 | Ht 64.0 in | Wt 225.5 lb

## 2022-09-03 DIAGNOSIS — C3490 Malignant neoplasm of unspecified part of unspecified bronchus or lung: Secondary | ICD-10-CM

## 2022-09-03 DIAGNOSIS — R911 Solitary pulmonary nodule: Secondary | ICD-10-CM

## 2022-10-08 ENCOUNTER — Encounter: Payer: Self-pay | Admitting: Internal Medicine

## 2022-10-25 ENCOUNTER — Encounter: Payer: Self-pay | Admitting: Internal Medicine

## 2022-10-28 ENCOUNTER — Ambulatory Visit (HOSPITAL_COMMUNITY)
Admission: RE | Admit: 2022-10-28 | Discharge: 2022-10-28 | Disposition: A | Discharge status: Home / Self Care | Payer: Managed Care, Other (non HMO) | Source: Ambulatory Visit | Attending: Thoracic Surgery (Cardiothoracic Vascular Surgery) | Admitting: Thoracic Surgery (Cardiothoracic Vascular Surgery)

## 2022-10-28 DIAGNOSIS — C3490 Malignant neoplasm of unspecified part of unspecified bronchus or lung: Secondary | ICD-10-CM | POA: Diagnosis present

## 2022-10-28 LAB — GLUCOSE, CAPILLARY: Glucose-Capillary: 88 mg/dL (ref 70–99)

## 2022-10-28 MED ORDER — FLUDEOXYGLUCOSE F - 18 (FDG) INJECTION
11.3000 | Freq: Once | INTRAVENOUS | Status: AC | PRN
  Administered 2022-10-28: 11.3 via INTRAVENOUS

## 2022-10-29 ENCOUNTER — Ambulatory Visit (INDEPENDENT_AMBULATORY_CARE_PROVIDER_SITE_OTHER): Payer: Medicare Other | Admitting: Thoracic Surgery (Cardiothoracic Vascular Surgery)

## 2022-10-29 ENCOUNTER — Encounter: Payer: Self-pay | Admitting: Thoracic Surgery (Cardiothoracic Vascular Surgery)

## 2022-10-29 VITALS — BP 137/78 | HR 77 | Resp 20 | Ht 64.0 in | Wt 221.0 lb

## 2022-10-29 DIAGNOSIS — C3490 Malignant neoplasm of unspecified part of unspecified bronchus or lung: Secondary | ICD-10-CM | POA: Diagnosis not present

## 2022-10-29 DIAGNOSIS — Z9889 Other specified postprocedural states: Secondary | ICD-10-CM

## 2022-10-29 NOTE — Progress Notes (Signed)
      [address]       [place] [address]             [phone]        Sarah Lee [place] Medical Record #[id number] Date of Birth: 10/29/1950  Referring: Tanda Rockers, MD Primary Care: Willey Blade, MD Primary Cardiologist:None  Reason for visit:   follow-up  History of Present Illness:     Sarah Lee comes in after PET/CT.  She originally underwent a lobectomy for stage II non-small cell lung cancer, and was noted to have a 1.5 cm lesion along the staple line on her surveillance scans.  The PET/CT does show mild uptake in this area.  She  Physical Exam: BP 137/78 (BP Location: Left Arm, Patient Position: Sitting, Cuff Size: Large)   Pulse 77   Resp 20   Ht 5\' 4"  (1.626 m)   Wt 221 lb (100.2 kg)   LMP 01/17/1994   SpO2 97% Comment: RA  BMI 37.93 kg/m   Alert NAD Abdomen, ND No peripheral edema   Diagnostic Studies & Laboratory data: PET/CT: IMPRESSION: Status post left lower lobectomy.   15 mm soft tissue nodule in the left infrahilar region, with associated mild hypermetabolism, indeterminate but concerning for soft tissue recurrence.   No evidence of distant metastases.  Assessment / Plan:   72 year old female status post left lower lobectomy for stage IIa non-small cell lung cancer.  She did receive adjuvant therapy however appears to have some recurrence of the staple line based off her PET/CT.  Given her referral to radiation oncology here at [facility], and refer her back to the medical oncology at [facility], for further discussion.  I will meet with her in 1 month to ensure follow-up.   Sarah Lee 10/29/2022 3:50 PM

## 2022-11-01 NOTE — Progress Notes (Signed)
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   HISTORY OF PRESENT ILLNESS: Sarah Lee is a 72 y.o. female seen at the request of Dr. Kipp Brood for a diagnosis of a Left lower lobe lung cancer.  The patient was originally found to have a lesion in the left lower lobe and underwent a biopsy after PET showed masslike consolidation of hypermetabolism.  The biopsy was on 09/03/2019 of the left lower lobe and showed adenocarcinoma.  She subsequently underwent a left lower lobectomy with Dr. Kipp Brood on 10/01/2019 that showed a 4.4 cm invasive adenosquamous carcinoma that was moderately differentiated.  Lymph nodes that 9L, 7, 11 L, 11 L #2, 12 L, 12 L #2, and 5 were all negative for disease.  She did go on to receive adjuvant chemotherapy one cycle here at our center, then transferred to [facility] and completed 2 additional cycles. She's been following with Dr. Kipp Brood and a recent CT chest without contrast on 09/03/2022 showed a new soft tissue nodule along the resection margin adjacent to the left atrium.  A PET scan on 10/28/2022 showed mild hypermetabolic activity with an SUV of 3.5 in a 15 mm soft tissue nodule in the left infrahilar region. She's seen to discuss focal radiotherapy.     PREVIOUS RADIATION THERAPY: No   PAST MEDICAL HISTORY:  Past Medical History:  Diagnosis Date   Allergy    Anemia    Arthritis    Asthma    as a child   Blood transfusion    Cancer ([place])    lung cancer   Cataract    slight   Complication of anesthesia    used sodium pentothal and very hard to wake up and arm swelled up from medication   Constipation    Gallstones    GERD (gastroesophageal reflux  disease)    Heart murmur    no issues   Pneumonia    Seasonal allergies        PAST SURGICAL HISTORY: Past Surgical History:  Procedure Laterality Date   ABDOMINAL HYSTERECTOMY  2004   fibroids--- TAH/BSO   BREAST SURGERY     right breast biopsy-benign   CESAREAN SECTION     CHOLECYSTECTOMY N/A 10/28/2016   Procedure: LAPAROSCOPIC CHOLECYSTECTOMY WITH INTRAOPERATIVE CHOLANGIOGRAM;  Surgeon: Jackolyn Confer, MD;  Location: [facility];  Service: General;  Laterality: N/A;   COLONOSCOPY     ERCP N/A 10/29/2016   Procedure: ENDOSCOPIC RETROGRADE CHOLANGIOPANCREATOGRAPHY (ERCP);  Surgeon: Doran Stabler, MD;  Location: [facility];  Service: Endoscopy;  Laterality: N/A;   LUNG LOBECTOMY     MULTIPLE EXTRACTIONS WITH ALVEOLOPLASTY Bilateral 12/06/2019   Procedure: Extraction of tooth #'s 4, and 7-13 with alveoloplasty  and bilateral maxillary  tuberosity reductions;  Surgeon: Lenn Cal, DDS;  Location: [place];  Service: Oral Surgery;  Laterality: Bilateral;   NODE DISSECTION Left 10/01/2019   Procedure: Node Dissection;  Surgeon: [name], MD;  Location: [place];  Service: Thoracic;  Laterality: Left;   POLYPECTOMY     UTERINE FIBROID SURGERY     VIDEO BRONCHOSCOPY N/A 10/01/2019   Procedure: VIDEO BRONCHOSCOPY;  Surgeon: Melodie Bouillon  O, MD;  Location: [facility];  Service: Thoracic;  Laterality: N/A;     FAMILY HISTORY:  Family History  Problem Relation Age of Onset   Anemia Mother    Cancer Father        prostate   Stomach cancer Brother    Stomach cancer Maternal Uncle    Stomach cancer Cousin        maternal side    Colon cancer Neg Hx    Esophageal cancer Neg Hx    Rectal cancer Neg Hx    Colon polyps Neg Hx      SOCIAL HISTORY:  reports that she quit smoking about 34 years ago. Her smoking use included cigarettes. She has a 37.50 pack-year smoking history. She has never used smokeless tobacco. She reports current alcohol use. She reports that she  does not use drugs.  The patient is married and lives in [place].  She works at a [employer] in [place].   ALLERGIES: Advil [ibuprofen], Iron, Penicillins, and Tetracyclines & related   MEDICATIONS:  Current Outpatient Medications  Medication Sig Dispense Refill   Acetylcysteine (N-ACETYL-L-CYSTEINE PO) Take 1 tablet by mouth daily.      b complex vitamins capsule Take 1 capsule by mouth daily.     Cholecalciferol (VITAMIN D-3) 125 MCG (5000 UT) TABS Take 10,000 Units by mouth daily.     Omega-3 Fatty Acids (OMEGA-3 FISH OIL PO) Take 1,000 mg by mouth daily.     zinc gluconate 50 MG tablet Take 50 mg by mouth daily.     No current facility-administered medications for this encounter.     REVIEW OF SYSTEMS: On review of systems, the patient reports that she is doing well without shortness of breath or chest pain. No other complaints are verbalized.       PHYSICAL EXAM:  Wt Readings from Last 3 Encounters:  11/03/22 222 lb (100.7 kg)  10/29/22 221 lb (100.2 kg)  09/03/22 225 lb 8 oz (102.3 kg)   Temp Readings from Last 3 Encounters:  11/03/22 (!) 96.7 F (35.9 C) (Temporal)  06/15/22 (!) 97.2 F (36.2 C) (Temporal)  11/29/21 97.7 F (36.5 C) (Oral)   BP Readings from Last 3 Encounters:  11/03/22 125/66  10/29/22 137/78  09/03/22 117/74   Pulse Readings from Last 3 Encounters:  11/03/22 96  10/29/22 77  09/03/22 65   Pain Assessment Pain Score: 0-No pain/10  In general this is a well appearing African American female in no acute distress. She's alert and oriented x4 and appropriate throughout the examination. Cardiopulmonary assessment is negative for acute distress and she exhibits normal effort.     ECOG = 0  0 - Asymptomatic (Fully active, able to carry on all predisease activities without restriction)  1 - Symptomatic but completely ambulatory (Restricted in physically strenuous activity but ambulatory and able to carry out work of a light or sedentary  nature. For example, light housework, office work)  2 - Symptomatic, <50% in bed during the day (Ambulatory and capable of all self care but unable to carry out any work activities. Up and about more than 50% of waking hours)  3 - Symptomatic, >50% in bed, but not bedbound (Capable of only limited self-care, confined to bed or chair 50% or more of waking hours)  4 - Bedbound (Completely disabled. Cannot carry on any self-care. Totally confined to bed or chair)  5 - Death   Eustace Pen MM, Creech RH, Tormey DC, et al. [phone]). "Toxicity  and response criteria of the [facility]". Ideal Oncol. 5 (6): 649-55    LABORATORY DATA:  Lab Results  Component Value Date   WBC 7.6 11/29/2021   HGB 12.8 11/29/2021   HCT 38.4 11/29/2021   MCV 90.1 11/29/2021   PLT 229 11/29/2021   Lab Results  Component Value Date   NA 136 11/29/2021   K 3.3 (L) 11/29/2021   CL 102 11/29/2021   CO2 25 11/29/2021   Lab Results  Component Value Date   ALT 22 11/29/2021   AST 27 11/29/2021   ALKPHOS 78 11/29/2021   BILITOT 0.7 11/29/2021      RADIOGRAPHY: NM PET Image Restag (PS) Skull Base To Thigh  Result Date: 10/29/2022 CLINICAL DATA:  Subsequent treatment strategy for lung cancer. Status post left lower lobectomy and chemotherapy. EXAM: NUCLEAR MEDICINE PET SKULL BASE TO THIGH TECHNIQUE: 11.2 mCi F-18 FDG was injected intravenously. Full-ring PET imaging was performed from the skull base to thigh after the radiotracer. CT data was obtained and used for attenuation correction and anatomic localization. Fasting blood glucose: 88 mg/dl COMPARISON:  CT chest dated 09/03/2022.  PET-CT dated 07/19/2019. FINDINGS: Mediastinal blood pool activity: SUV max 3.6 Liver activity: SUV max NA NECK: No hypermetabolic cervical lymphadenopathy. Incidental CT findings: None. CHEST: Status post left lower lobectomy. Associated 15 mm soft tissue nodule in the left infrahilar region (series 4/image 84),  along the bronchial suture line, with associated mild hypermetabolism, max SUV 3.5. This raises concern for soft tissue recurrence, but technically remains indeterminate. No hypermetabolic thoracic lymphadenopathy. Incidental CT findings: Mild coronary atherosclerosis of the LAD and right coronary artery. ABDOMEN/PELVIS: No abnormal hypermetabolism in the liver, spleen, pancreas, or adrenal glands. No hypermetabolic abdominopelvic lymphadenopathy. Incidental CT findings: Status post cholecystectomy. Status post hysterectomy. SKELETON: No focal hypermetabolic activity to suggest skeletal metastasis. Incidental CT findings: Mild degenerative changes of the visualized thoracolumbar spine. IMPRESSION: Status post left lower lobectomy. 15 mm soft tissue nodule in the left infrahilar region, with associated mild hypermetabolism, indeterminate but concerning for soft tissue recurrence. No evidence of distant metastases. Electronically Signed   By: Julian Hy M.D.   On: 10/29/2022 02:13       IMPRESSION/PLAN: 1. Locally Recurrent Stage IIA , pT2a, N0, M0, NSCLC, adenosquamous carcinoma of the LLL. Dr. Lisbeth Renshaw discusses the patient's course to date including prior surgical pathology and recent imaging findings. He recommends ultrahypofractionated radiotherapy. The patient will also meet with her medical oncologist Dr. Loletta Specter at [facility] to see if he would recommend any additional systemic therapy. We discussed the risks, benefits, short, and long term effects of radiotherapy, as well as the curative intent, and the patient is interested considering her options prior to committing to treatment. Dr. Lisbeth Renshaw discusses the delivery and logistics of radiotherapy and anticipates a course of 2 weeks of radiotherapy. The patient will be contacted to coordinate treatment planning by our simulation department and can alter plans if she decides to go a different route for therapy.   In a visit lasting 60 minutes, greater than 50%  of the time was spent face to face discussing the patient's condition, in preparation for the discussion, and coordinating the patient's care.   The above documentation reflects my direct findings during this shared patient visit. Please see the separate note by Dr. Lisbeth Renshaw on this date for the remainder of the patient's plan of care.    Carola Rhine, [facility]   **Disclaimer: This note was dictated with  voice recognition software. Similar sounding words can inadvertently be transcribed and this note may contain transcription errors which may not have been corrected upon publication of note.**

## 2022-11-02 NOTE — Progress Notes (Signed)
Thoracic Location of Tumor / Histology: Left Lower Lobe Lung, now with new soft tissue nodule along the resection margin.  Patient presented for surveillance scans for left lower lobe lung cancer.  PET 10/28/2022: Status post left lower lobectomy.  15 mm soft tissue nodule in the left infrahilar region, with associated mild hypermetabolism, indeterminate but concerning for soft tissue recurrence.  CT Chest 09/03/2022:  Postsurgical change consistent with LEFT lower lobe resection.  New soft tissue inferior along the resection margin adjacent LEFT atrium. Potential delayed postsurgical change versus tumor recurrence.   Biopsies of    Tobacco/Marijuana/Snuff/ETOH use: Former Smoker, quit 03/02/1988   Past/Anticipated interventions by cardiothoracic surgery, if any:  Dr. Kipp Brood 10/29/2022 -She is status post left lower lobectomy 10/01/2019 for stage IIa non-small cell lung cancer.  -She did receive adjuvant therapy however appears to have some recurrence of the staple line based off her PET/CT.  -Given her referral to radiation oncology here at [facility], and refer her back to the medical oncology at [facility], for further discussion.   Past/Anticipated interventions by medical oncology, if any:    Signs/Symptoms Weight changes, if any:  Respiratory complaints, if any: She reports SOB with increased activities, walking long distances. Hemoptysis, if any: She reports baseline cough due to asthma.  Denies hemoptysis. Pain issues, if any: Denies chest pain, pressure, and tightness.   SAFETY ISSUES: Prior radiation? No Pacemaker/ICD? No Possible current pregnancy? Hysterectomy Is the patient on methotrexate? No  Current Complaints / other details:

## 2022-11-03 ENCOUNTER — Ambulatory Visit
Admission: RE | Admit: 2022-11-03 | Discharge: 2022-11-03 | Disposition: A | Discharge status: Home / Self Care | Payer: Managed Care, Other (non HMO) | Source: Ambulatory Visit | Attending: Radiation Oncology | Admitting: Radiation Oncology

## 2022-11-03 ENCOUNTER — Other Ambulatory Visit: Payer: Self-pay

## 2022-11-03 ENCOUNTER — Encounter: Payer: Self-pay | Admitting: Radiation Oncology

## 2022-11-03 VITALS — BP 125/66 | HR 96 | Temp 96.7°F | Resp 18 | Ht 64.0 in | Wt 222.0 lb

## 2022-11-03 DIAGNOSIS — F17210 Nicotine dependence, cigarettes, uncomplicated: Secondary | ICD-10-CM | POA: Diagnosis not present

## 2022-11-03 DIAGNOSIS — R011 Cardiac murmur, unspecified: Secondary | ICD-10-CM | POA: Diagnosis not present

## 2022-11-03 DIAGNOSIS — C3432 Malignant neoplasm of lower lobe, left bronchus or lung: Secondary | ICD-10-CM | POA: Diagnosis present

## 2022-11-03 DIAGNOSIS — K219 Gastro-esophageal reflux disease without esophagitis: Secondary | ICD-10-CM | POA: Insufficient documentation

## 2022-11-03 DIAGNOSIS — Z800 Family history of malignant neoplasm of digestive organs: Secondary | ICD-10-CM | POA: Insufficient documentation

## 2022-11-03 DIAGNOSIS — K5900 Constipation, unspecified: Secondary | ICD-10-CM | POA: Diagnosis not present

## 2022-11-03 DIAGNOSIS — J45909 Unspecified asthma, uncomplicated: Secondary | ICD-10-CM | POA: Diagnosis not present

## 2022-11-03 DIAGNOSIS — C3492 Malignant neoplasm of unspecified part of left bronchus or lung: Secondary | ICD-10-CM

## 2022-11-05 ENCOUNTER — Encounter: Payer: Self-pay | Admitting: *Deleted

## 2022-11-11 ENCOUNTER — Telehealth: Payer: Self-pay | Admitting: Radiation Oncology

## 2022-11-11 NOTE — Telephone Encounter (Signed)
The patient was hoping to see Dr. Carlis Abbott at [facility] to decide if he was in agreement with the proposed radiation that we have offered her.  Unfortunately her appointments at [facility] had to be delayed due to his time out of the office and recent illness so she is meeting with him on Monday of this coming week.  I let her know that our staff would be in touch with her on Tuesday to see if she would like to reschedule plans for simulation and subsequent treatment and at that point we would have access to the notes from her visit with Dr. Carlis Abbott as well.  She is in agreement with this.

## 2022-11-29 ENCOUNTER — Telehealth: Payer: Self-pay | Admitting: Radiation Oncology

## 2022-11-29 NOTE — Telephone Encounter (Signed)
I called the pt to follow up. She met with Dr. Loletta Specter at [facility] as well as with interventional pulmonology, Dr. Tama High and they recommended repeat imaging in 3 months rather than radiation at this time. I had to leave a message however asking her to call if she had any questions for [place].

## 2022-12-03 ENCOUNTER — Telehealth: Payer: Managed Care, Other (non HMO) | Admitting: Thoracic Surgery (Cardiothoracic Vascular Surgery)

## 2022-12-22 ENCOUNTER — Encounter: Payer: Self-pay | Admitting: Gastroenterology

## 2023-02-23 ENCOUNTER — Encounter: Payer: Self-pay | Admitting: Gastroenterology

## 2023-02-24 ENCOUNTER — Telehealth: Payer: Self-pay

## 2023-02-24 NOTE — Telephone Encounter (Signed)
John,   Reviewing patients chart I saw that she was hard to wake after surgery. Sodium pentothal was used to wake her up. Arm swelled. Previous colonoscopy in [facility] on 01/23/18 with no previous mention of complication. Please review and advise. Thanks!  Janalee Dane, LPN  PV

## 2023-02-25 NOTE — Telephone Encounter (Signed)
John, Information noted on PV chart. Thanks for your prompt response!   Janalee Dane, LPN PV

## 2023-03-01 ENCOUNTER — Other Ambulatory Visit: Payer: Self-pay | Admitting: Radiation Oncology

## 2023-03-01 DIAGNOSIS — C3492 Malignant neoplasm of unspecified part of left bronchus or lung: Secondary | ICD-10-CM

## 2023-03-02 ENCOUNTER — Telehealth: Payer: Self-pay | Admitting: *Deleted

## 2023-03-02 NOTE — Telephone Encounter (Signed)
Called patient to inform of CT for 03-10-23- arrival time- 7:45 am @ [facility], no restrictions to test, patient will be contacted by Laurence Aly with results via telephone , spoke with patient and she is aware of these appts. and the instructions

## 2023-03-07 ENCOUNTER — Encounter: Payer: Self-pay | Admitting: Internal Medicine

## 2023-03-09 ENCOUNTER — Ambulatory Visit (AMBULATORY_SURGERY_CENTER): Payer: Managed Care, Other (non HMO)

## 2023-03-09 VITALS — Ht 64.5 in | Wt 217.0 lb

## 2023-03-09 DIAGNOSIS — Z86010 Personal history of colonic polyps: Secondary | ICD-10-CM

## 2023-03-09 MED ORDER — NA SULFATE-K SULFATE-MG SULF 17.5-3.13-1.6 GM/177ML PO SOLN: 1.0000 | Freq: Once | ORAL | 0 refills | Status: AC

## 2023-03-09 NOTE — Progress Notes (Signed)
Pre visit completed via phone call; Patient verified name, DOB, and address;  No egg or soy allergy known to patient; No issues known to pt with past sedation with any surgeries or procedures; Patient denies ever being told they had issues or difficulty with intubation;  No FH of Malignant Hyperthermia; Pt is not on diet pills; Pt is not on home 02;  Pt is not on blood thinners;  Pt reports issues with constipation - randomly; eats fruits to help with constipation; patient advised to increase water intake, activity, eat fruits/veggies;  No A fib or A flutter; Have any cardiac testing pending--NO Pt instructed to use Singlecare.com or GoodRx for a price reduction on prep;  Insurance verified during PV appt=[employer]  Patient's chart reviewed by Cathlyn Parsons CNRA prior to previsit and patient appropriate for the [facility].  Previsit completed and red dot placed by patient's name on their procedure day (on provider's schedule).    Instructions sent to patient via mail along with a [facility] GoodRx coupon;

## 2023-03-10 ENCOUNTER — Telehealth: Payer: Self-pay | Admitting: Radiation Oncology

## 2023-03-10 ENCOUNTER — Ambulatory Visit (HOSPITAL_COMMUNITY): Admission: RE | Admit: 2023-03-10 | Payer: Medicare Other | Source: Ambulatory Visit

## 2023-03-10 ENCOUNTER — Ambulatory Visit (HOSPITAL_COMMUNITY)
Admission: RE | Admit: 2023-03-10 | Discharge: 2023-03-10 | Disposition: A | Discharge status: Home / Self Care | Payer: Managed Care, Other (non HMO) | Source: Ambulatory Visit | Attending: Radiation Oncology | Admitting: Radiation Oncology

## 2023-03-10 DIAGNOSIS — C3492 Malignant neoplasm of unspecified part of left bronchus or lung: Secondary | ICD-10-CM | POA: Insufficient documentation

## 2023-03-10 LAB — POCT I-STAT CREATININE: Creatinine, Ser: 0.9 mg/dL (ref 0.44–1.00)

## 2023-03-10 MED ORDER — SODIUM CHLORIDE (PF) 0.9 % IJ SOLN
INTRAMUSCULAR | Status: AC
Start: 2023-03-10 — End: ?
  Filled 2023-03-10: qty 50

## 2023-03-10 MED ORDER — IOHEXOL 300 MG/ML  SOLN
75.0000 mL | Freq: Once | INTRAMUSCULAR | Status: AC | PRN
  Administered 2023-03-10: 75 mL via INTRAVENOUS

## 2023-03-10 NOTE — Telephone Encounter (Signed)
I spoke with the patient after reviewing her CT scan with Dr. Mitzi Hansen. She has a left hilar nodular change in the region of her prior lung cancer surgery. This was notable by CT, confirmed by PET with low level activity compared to blood pool. We had discussed radiotherapy in April, and her team at [facility] recommended shorter interval scan at 3 months. Given the stability of the lesion, Dr. Mitzi Hansen is in favor to follow this but the patient is aware that he would recommend another CT scan in 3 months time, and would have a low threshold to treat this with radiation if the site became larger or more hypermetabolic (if another PET were performed).

## 2023-03-23 ENCOUNTER — Encounter: Payer: Self-pay | Admitting: Certified Registered Nurse Anesthetist

## 2023-03-25 ENCOUNTER — Encounter: Payer: Self-pay | Admitting: Gastroenterology

## 2023-03-25 ENCOUNTER — Ambulatory Visit (AMBULATORY_SURGERY_CENTER): Payer: Managed Care, Other (non HMO) | Admitting: Gastroenterology

## 2023-03-25 VITALS — BP 111/64 | HR 80 | Temp 98.6°F | Resp 14 | Ht 64.0 in | Wt 217.0 lb

## 2023-03-25 DIAGNOSIS — D122 Benign neoplasm of ascending colon: Secondary | ICD-10-CM | POA: Diagnosis not present

## 2023-03-25 DIAGNOSIS — Z86010 Personal history of colonic polyps: Secondary | ICD-10-CM | POA: Diagnosis not present

## 2023-03-25 DIAGNOSIS — Z09 Encounter for follow-up examination after completed treatment for conditions other than malignant neoplasm: Secondary | ICD-10-CM

## 2023-03-25 DIAGNOSIS — D124 Benign neoplasm of descending colon: Secondary | ICD-10-CM

## 2023-03-25 DIAGNOSIS — K5140 Inflammatory polyps of colon without complications: Secondary | ICD-10-CM

## 2023-03-25 MED ORDER — SODIUM CHLORIDE 0.9 % IV SOLN: 500.0000 mL | Freq: Once | INTRAVENOUS | Status: DC

## 2023-03-25 NOTE — Op Note (Signed)
[facility] Patient Name: Sarah Lee Procedure Date: 03/25/2023 1:16 PM MRN: [id number] Endoscopist: Sherilyn Cooter L. Myrtie Neither , MD, [id number] Age: 72 Referring MD:  Date of Birth: 03/19/1951 Gender: Female Account #: [id number] Procedure:                Colonoscopy Indications:              Surveillance: Personal history of adenomatous                            polyps on last colonoscopy 5 years ago                           Diminutive tubular adenoma x 12 Jan 2018 no polyps                            in 2013 Medicines:                Monitored Anesthesia Care Procedure:                Pre-Anesthesia Assessment:                           - Prior to the procedure, a History and Physical                            was performed, and patient medications and                            allergies were reviewed. The patient's tolerance of                            previous anesthesia was also reviewed. The risks                            and benefits of the procedure and the sedation                            options and risks were discussed with the patient.                            All questions were answered, and informed consent                            was obtained. Prior Anticoagulants: The patient has                            taken no anticoagulant or antiplatelet agents. ASA                            Grade Assessment: II - A patient with mild systemic                            disease. After reviewing the risks and benefits,  the patient was deemed in satisfactory condition to                            undergo the procedure.                           After obtaining informed consent, the colonoscope                            was passed under direct vision. Throughout the                            procedure, the patient's blood pressure, pulse, and                            oxygen saturations were monitored continuously. The                             Olympus CF-HQ190L ([id number]) Colonoscope was                            introduced through the anus and advanced to the the                            cecum, identified by appendiceal orifice and                            ileocecal valve. The colonoscopy was performed                            without difficulty. The patient tolerated the                            procedure well. The quality of the bowel                            preparation was excellent. The ileocecal valve,                            appendiceal orifice, and rectum were photographed. Scope In: 1:31:53 PM Scope Out: 1:45:07 PM Scope Withdrawal Time: 0 hours 9 minutes 15 seconds  Total Procedure Duration: 0 hours 13 minutes 14 seconds  Findings:                 The perianal and digital rectal examinations were                            normal.                           Repeat examination of right colon under NBI                            performed.  Two sessile polyps were found in the descending                            colon and ascending colon. The polyps were                            diminutive in size. These polyps were removed with                            a cold snare. Resection and retrieval were complete.                           Internal hemorrhoids were found. The hemorrhoids                            were small.                           The exam was otherwise without abnormality on                            direct and retroflexion views. Complications:            No immediate complications. Estimated Blood Loss:     Estimated blood loss was minimal. Impression:               - Two diminutive polyps in the descending colon and                            in the ascending colon, removed with a cold snare.                            Resected and retrieved.                           - Internal hemorrhoids.                           - The examination was  otherwise normal on direct                            and retroflexion views. Recommendation:           - Patient has a contact number available for                            emergencies. The signs and symptoms of potential                            delayed complications were discussed with the                            patient. Return to normal activities tomorrow.                            Written discharge instructions were provided to  the                            patient.                           - Resume previous diet.                           - Continue present medications.                           - Await pathology results.                           - Repeat colonoscopy may be recommended for                            surveillance. That be determined after pathology                            results from today's exam become available for                            review. Jaramiah Bossard L. Myrtie Neither, MD 03/25/2023 1:51:28 PM This report has been signed electronically.

## 2023-03-25 NOTE — Progress Notes (Signed)
History and Physical:  This patient presents for endoscopic testing for: Encounter Diagnosis  Name Primary?   Personal history of colonic polyps Yes    Surveillance colonoscopy today.  May 2019- diminutive TA x 2 .  No polyps 2013 Patient denies chronic abdominal pain, rectal bleeding, constipation or diarrhea.  Patient is otherwise without complaints or active issues today.   Past Medical History: Past Medical History:  Diagnosis Date   Allergy    Anemia    Arthritis    Asthma    as a child   Blood transfusion    Cataract    slight   Complication of anesthesia    used sodium pentothal and very hard to wake up and arm swelled up from medication   Constipation    Gallstones    GERD (gastroesophageal reflux disease)    Heart murmur    no issues   Lung cancer (HCC) 2019   Pneumonia    Seasonal allergies    Thyroid disease    not a nodule, not on meds, being monitored for this issue     Past Surgical History: Past Surgical History:  Procedure Laterality Date   ABDOMINAL HYSTERECTOMY  2004   fibroids--- TAH/BSO   BREAST SURGERY     right breast biopsy-benign   CESAREAN SECTION     CHOLECYSTECTOMY N/A 10/28/2016   Procedure: LAPAROSCOPIC CHOLECYSTECTOMY WITH INTRAOPERATIVE CHOLANGIOGRAM;  Surgeon: Avel Peace, MD;  Location: [facility];  Service: General;  Laterality: N/A;   COLONOSCOPY  2019   HD-MAC-goly(good)-TA x 2   ERCP N/A 10/29/2016   Procedure: ENDOSCOPIC RETROGRADE CHOLANGIOPANCREATOGRAPHY (ERCP);  Surgeon: Sherrilyn Rist, MD;  Location: [facility];  Service: Endoscopy;  Laterality: N/A;   LUNG LOBECTOMY     MULTIPLE EXTRACTIONS WITH ALVEOLOPLASTY Bilateral 12/06/2019   Procedure: Extraction of tooth #'s 4, and 7-13 with alveoloplasty  and bilateral maxillary  tuberosity reductions;  Surgeon: Charlynne Pander, DDS;  Location: [facility];  Service: Oral Surgery;  Laterality: Bilateral;   NODE DISSECTION Left 10/01/2019   Procedure: Node Dissection;   Surgeon: Corliss Skains, MD;  Location: [facility] OR;  Service: Thoracic;  Laterality: Left;   POLYPECTOMY  2019   TA x2   UTERINE FIBROID SURGERY     VIDEO BRONCHOSCOPY N/A 10/01/2019   Procedure: VIDEO BRONCHOSCOPY;  Surgeon: Corliss Skains, MD;  Location: [facility] OR;  Service: Thoracic;  Laterality: N/A;    Allergies: Allergies  Allergen Reactions   Ibuprofen Other (See Comments)    Rectal bleeding   Iron Other (See Comments)    Extreme constipation   Penicillins Rash and Hives    Did it involve swelling of the face/tongue/throat, SOB, or low BP? No  Did it involve sudden or severe rash/hives, skin peeling, or any reaction on the inside of your mouth or nose? No  Did you need to seek medical attention at a hospital or doctor's office? No  When did it last happen?~20 years or so  If all above answers are "NO", may proceed with cephalosporin use.  Did it involve swelling of the face/tongue/throat, SOB, or low BP? No, Did it involve sudden or severe rash/hives, skin peeling, or any reaction on the inside of your mouth or nose? No, Did you need to seek medical attention at a hospital or doctor's office? No, When did it last happen?~20 years or so, If all above answers are "NO", may proceed with cephalosporin use., , Did it involve swelling of the face/tongue/throat, SOB,  or low BP? No, Did it involve sudden or severe rash/hives, skin peeling, or an   Tetracycline Nausea And Vomiting, Anxiety and Cough    Angioedema, Depression   Pentothal [Thiopental] Other (See Comments)    Caused a slow wake up for patient; localized reaction at IV site;    Tetracycline Hcl Nausea And Vomiting, Anxiety, Other (See Comments) and Cough    Angioedema, Depression   Tetracyclines & Related Nausea And Vomiting and Anxiety    Outpatient Meds: Current Outpatient Medications  Medication Sig Dispense Refill   Acetylcysteine (N-ACETYL-L-CYSTEINE PO) Take 1 tablet by mouth daily.      b complex vitamins  capsule Take 1 capsule by mouth daily.     MAGNESIUM PO Take 1 capsule by mouth daily. (Patient not taking: Reported on 03/09/2023)     Omega-3 Fatty Acids (OMEGA-3 FISH OIL PO) Take 1,000 mg by mouth daily.     Probiotic, Lactobacillus, CAPS Take 1 capsule by mouth daily.     TURMERIC CURCUMIN PO Take 1 tablet by mouth daily.     Vitamin D-Vitamin K (K2-D3 10,000) 10000-45 UNIT-[facility] CAPS Take 1 capsule by mouth daily.     zinc gluconate 50 MG tablet Take 50 mg by mouth daily.     Current Facility-Administered Medications  Medication Dose Route Frequency Provider Last Rate Last Admin   0.9 %  sodium chloride infusion  500 mL Intravenous Once Sherrilyn Rist, MD          ___________________________________________________________________ Objective   Exam:  BP 127/76   Pulse 80   Temp 98.6 F (37 C)   Resp 13   Ht 5\' 4"  (1.626 m)   Wt 217 lb (98.4 kg)   LMP 01/17/1994   SpO2 100%   BMI 37.25 kg/m   CV: regular , S1/S2 Resp: clear to auscultation bilaterally, normal RR and effort noted GI: soft, no tenderness, with active bowel sounds.   Assessment: Encounter Diagnosis  Name Primary?   Personal history of colonic polyps Yes     Plan: Colonoscopy   The benefits and risks of the planned procedure were described in detail with the patient or (when appropriate) their health care proxy.  Risks were outlined as including, but not limited to, bleeding, infection, perforation, adverse medication reaction leading to cardiac or pulmonary decompensation, pancreatitis (if ERCP).  The limitation of incomplete mucosal visualization was also discussed.  No guarantees or warranties were given.  The patient is appropriate for an endoscopic procedure in the ambulatory setting.   - Amada Jupiter, MD

## 2023-03-25 NOTE — Progress Notes (Signed)
Pt's states no medical or surgical changes since previsit or office visit. 

## 2023-03-25 NOTE — Progress Notes (Signed)
Called to room to assist during endoscopic procedure.  Patient ID and intended procedure confirmed with present staff. Received instructions for my participation in the procedure from the performing physician.  

## 2023-03-25 NOTE — Patient Instructions (Addendum)
Recommendation:           - Patient has a contact number available for                            emergencies. The signs and symptoms of potential                            delayed complications were discussed with the                            patient. Return to normal activities tomorrow.                            Written discharge instructions were provided to the                            patient.                           - Resume previous diet.                           - Continue present medications.                           - Await pathology results.                           - Repeat colonoscopy may be recommended for                            surveillance. That be determined after pathology                            results from today's exam become available for                            review.  Handouts on polyps and hemorrhoids given.  YOU HAD AN ENDOSCOPIC PROCEDURE TODAY AT THE [facility]:   Refer to the procedure report that was given to you for any specific questions about what was found during the examination.  If the procedure report does not answer your questions, please call your gastroenterologist to clarify.  If you requested that your care partner not be given the details of your procedure findings, then the procedure report has been included in a sealed envelope for you to review at your convenience later.  YOU SHOULD EXPECT: Some feelings of bloating in the abdomen. Passage of more gas than usual.  Walking can help get rid of the air that was put into your GI tract during the procedure and reduce the bloating. If you had a lower endoscopy (such as a colonoscopy or flexible sigmoidoscopy) you may notice spotting of blood in your stool or on the toilet paper. If you underwent a bowel prep for your procedure, you may not have a normal bowel movement for a few days.  Please Note:  You might notice some irritation and congestion in your nose or some  drainage.  This is from the oxygen used during your procedure.  There is no need for concern and it should clear up in a day or so.  SYMPTOMS TO REPORT IMMEDIATELY:  Following lower endoscopy (colonoscopy or flexible sigmoidoscopy):  Excessive amounts of blood in the stool  Significant tenderness or worsening of abdominal pains  Swelling of the abdomen that is new, acute  Fever of 100F or higher  For urgent or emergent issues, a gastroenterologist can be reached at any hour by calling [phone]. Do not use MyChart messaging for urgent concerns.    DIET:  We do recommend a small meal at first, but then you may proceed to your regular diet.  Drink plenty of fluids but you should avoid alcoholic beverages for 24 hours.  ACTIVITY:  You should plan to take it easy for the rest of today and you should NOT DRIVE or use heavy machinery until tomorrow (because of the sedation medicines used during the test).    FOLLOW UP: Our staff will call the number listed on your records the next business day following your procedure.  We will call around 7:15- 8:00 am to check on you and address any questions or concerns that you may have regarding the information given to you following your procedure. If we do not reach you, we will leave a message.     If any biopsies were taken you will be contacted by phone or by letter within the next 1-3 weeks.  Please call us at [phone] if you have not heard about the biopsies in 3 weeks.    SIGNATURES/CONFIDENTIALITY: You and/or your care partner have signed paperwork which will be entered into your electronic medical record.  These signatures attest to the fact that that the information above on your After Visit Summary has been reviewed and is understood.  Full responsibility of the confidentiality of this discharge information lies with you and/or your care-partner.

## 2023-03-28 ENCOUNTER — Telehealth: Payer: Self-pay | Admitting: *Deleted

## 2023-03-28 NOTE — Telephone Encounter (Signed)
  Follow up Call-     03/25/2023    1:03 PM  Call back number  Post procedure Call Back phone  # [phone]  Permission to leave phone message Yes     Patient questions:  Do you have a fever, pain , or abdominal swelling? No. Pain Score  0 *  Have you tolerated food without any problems? Yes.    Have you been able to return to your normal activities? Yes.    Do you have any questions about your discharge instructions: Diet   No. Medications  No. Follow up visit  No.  Do you have questions or concerns about your Care? No.  Actions: * If pain score is 4 or above: No action needed, pain <4.

## 2023-04-01 ENCOUNTER — Ambulatory Visit (INDEPENDENT_AMBULATORY_CARE_PROVIDER_SITE_OTHER): Payer: Managed Care, Other (non HMO) | Admitting: Thoracic Surgery (Cardiothoracic Vascular Surgery)

## 2023-04-01 DIAGNOSIS — C3492 Malignant neoplasm of unspecified part of left bronchus or lung: Secondary | ICD-10-CM | POA: Diagnosis not present

## 2023-04-01 NOTE — Progress Notes (Signed)
     [address]       [place] [address]             [phone]       Patient: Home Provider: Office Consent for Telemedicine visit obtained.  Today's visit was completed via a real-time telehealth (see specific modality noted below). The patient/authorized person provided oral consent at the time of the visit to engage in a telemedicine encounter with the present provider at [facility]. The patient/authorized person was informed of the potential benefits, limitations, and risks of telemedicine. The patient/authorized person expressed understanding that the laws that protect confidentiality also apply to telemedicine. The patient/authorized person acknowledged understanding that telemedicine does not provide emergency services and that he or she would need to call 911 or proceed to the nearest hospital for help if such a need arose.   Total time spent in the clinical discussion 10 minutes.  Telehealth Modality: Phone visit (audio only)  I had a telephone visit with Mrs. Waguespack.  She has had follow-up with radiation and medical oncology.  The decision has been made to continue with surveillance for now.  They will continue to follow her.  She will follow-up with me as needed  Corliss Skains

## 2023-04-04 ENCOUNTER — Telehealth: Payer: Self-pay | Admitting: *Deleted

## 2023-04-04 ENCOUNTER — Encounter: Payer: Self-pay | Admitting: Gastroenterology

## 2023-04-04 NOTE — Telephone Encounter (Signed)
Called patient to ask if she would be williing to have a fu appt. with Dr. Kemper Durie @ [facility], lvm for a return call

## 2023-04-04 NOTE — Telephone Encounter (Signed)
Called patient to inform of scan and fu with Dr. Kemper Durie on 04-13-23, spoke with patient and she is aware of this appt.

## 2023-06-16 ENCOUNTER — Ambulatory Visit: Payer: Medicare Other | Admitting: Cardiology

## 2023-09-05 ENCOUNTER — Other Ambulatory Visit: Payer: Self-pay

## 2023-11-18 ENCOUNTER — Other Ambulatory Visit: Payer: Self-pay

## 2023-11-30 ENCOUNTER — Other Ambulatory Visit: Payer: Self-pay

## 2023-12-21 ENCOUNTER — Encounter: Payer: Self-pay | Admitting: Internal Medicine

## 2023-12-30 ENCOUNTER — Ambulatory Visit: Admitting: Cardiology

## 2024-02-22 ENCOUNTER — Other Ambulatory Visit: Payer: Self-pay

## 2024-07-30 ENCOUNTER — Other Ambulatory Visit: Payer: Self-pay | Admitting: Internal Medicine

## 2024-07-30 DIAGNOSIS — R1084 Generalized abdominal pain: Secondary | ICD-10-CM

## 2024-07-31 ENCOUNTER — Other Ambulatory Visit: Payer: Self-pay

## 2024-08-03 ENCOUNTER — Other Ambulatory Visit: Payer: Self-pay

## 2024-08-16 ENCOUNTER — Ambulatory Visit
Admission: RE | Admit: 2024-08-16 | Discharge: 2024-08-16 | Disposition: A | Source: Ambulatory Visit | Attending: Internal Medicine | Admitting: Internal Medicine

## 2024-08-16 ENCOUNTER — Encounter: Payer: Self-pay | Admitting: Internal Medicine

## 2024-08-16 DIAGNOSIS — R1084 Generalized abdominal pain: Secondary | ICD-10-CM
